# Patient Record
Sex: Female | Born: 1935
Health system: Southern US, Community
[De-identification: ages and names within clinical notes are randomized; demographics above are authoritative.]

## PROBLEM LIST (undated history)

## (undated) DIAGNOSIS — R112 Nausea with vomiting, unspecified: Secondary | ICD-10-CM

## (undated) DIAGNOSIS — R011 Cardiac murmur, unspecified: Secondary | ICD-10-CM

## (undated) DIAGNOSIS — F419 Anxiety disorder, unspecified: Secondary | ICD-10-CM

## (undated) DIAGNOSIS — M1990 Unspecified osteoarthritis, unspecified site: Secondary | ICD-10-CM

## (undated) DIAGNOSIS — I10 Essential (primary) hypertension: Secondary | ICD-10-CM

## (undated) DIAGNOSIS — T8859XA Other complications of anesthesia, initial encounter: Secondary | ICD-10-CM

## (undated) DIAGNOSIS — C801 Malignant (primary) neoplasm, unspecified: Secondary | ICD-10-CM

## (undated) DIAGNOSIS — Z9889 Other specified postprocedural states: Secondary | ICD-10-CM

## (undated) HISTORY — PX: REPLACEMENT TOTAL KNEE BILATERAL: SUR1225

## (undated) HISTORY — PX: NOSE SURGERY: SHX723

## (undated) HISTORY — PX: BLADDER SUSPENSION: SHX72

## (undated) HISTORY — DX: Essential (primary) hypertension: I10

## (undated) HISTORY — PX: CHOLECYSTECTOMY: SHX55

## (undated) HISTORY — PX: SHOULDER SURGERY: SHX246

## (undated) HISTORY — PX: ABDOMINAL HYSTERECTOMY: SHX81

---

## 1976-11-08 HISTORY — PX: BREAST BIOPSY: SHX20

## 2005-02-16 ENCOUNTER — Encounter: Admission: RE | Admit: 2005-02-16 | Discharge: 2005-02-16 | Payer: Self-pay | Admitting: Orthopedic Surgery

## 2005-03-15 ENCOUNTER — Ambulatory Visit (HOSPITAL_BASED_OUTPATIENT_CLINIC_OR_DEPARTMENT_OTHER): Admission: RE | Admit: 2005-03-15 | Discharge: 2005-03-15 | Payer: Self-pay | Admitting: Orthopedic Surgery

## 2005-03-15 ENCOUNTER — Ambulatory Visit (HOSPITAL_COMMUNITY): Admission: RE | Admit: 2005-03-15 | Discharge: 2005-03-15 | Payer: Self-pay | Admitting: Orthopedic Surgery

## 2005-07-20 ENCOUNTER — Ambulatory Visit: Payer: Self-pay | Admitting: Unknown Physician Specialty

## 2005-07-26 ENCOUNTER — Ambulatory Visit: Payer: Self-pay | Admitting: Unknown Physician Specialty

## 2005-08-03 ENCOUNTER — Ambulatory Visit: Payer: Self-pay | Admitting: Unknown Physician Specialty

## 2006-08-04 ENCOUNTER — Ambulatory Visit: Payer: Self-pay | Admitting: Unknown Physician Specialty

## 2007-07-25 ENCOUNTER — Ambulatory Visit: Payer: Self-pay | Admitting: Obstetrics and Gynecology

## 2007-07-25 ENCOUNTER — Other Ambulatory Visit: Payer: Self-pay

## 2007-08-08 ENCOUNTER — Ambulatory Visit: Payer: Self-pay | Admitting: Unknown Physician Specialty

## 2007-08-15 ENCOUNTER — Ambulatory Visit: Payer: Self-pay | Admitting: Obstetrics and Gynecology

## 2008-03-20 ENCOUNTER — Ambulatory Visit: Payer: Self-pay | Admitting: Unknown Physician Specialty

## 2008-08-09 ENCOUNTER — Ambulatory Visit: Payer: Self-pay | Admitting: Unknown Physician Specialty

## 2009-08-20 ENCOUNTER — Ambulatory Visit: Payer: Self-pay | Admitting: Unknown Physician Specialty

## 2010-08-28 ENCOUNTER — Ambulatory Visit: Payer: Self-pay | Admitting: Unknown Physician Specialty

## 2010-09-09 ENCOUNTER — Ambulatory Visit: Payer: Self-pay | Admitting: Unknown Physician Specialty

## 2010-10-27 ENCOUNTER — Ambulatory Visit: Payer: Self-pay | Admitting: Unknown Physician Specialty

## 2010-10-28 LAB — PATHOLOGY REPORT

## 2011-03-26 LAB — HM MAMMOGRAPHY: HM Mammogram: NORMAL

## 2011-04-26 LAB — HM COLONOSCOPY: HM Colonoscopy: NORMAL

## 2011-04-26 LAB — HM DEXA SCAN

## 2011-10-06 ENCOUNTER — Ambulatory Visit: Payer: Self-pay | Admitting: Unknown Physician Specialty

## 2012-04-25 ENCOUNTER — Encounter: Payer: Self-pay | Admitting: Internal Medicine

## 2012-04-25 ENCOUNTER — Ambulatory Visit: Payer: Self-pay | Admitting: Internal Medicine

## 2012-04-25 ENCOUNTER — Ambulatory Visit (INDEPENDENT_AMBULATORY_CARE_PROVIDER_SITE_OTHER): Payer: Medicare Other | Admitting: Internal Medicine

## 2012-04-25 VITALS — BP 110/60 | HR 50 | Temp 98.0°F | Ht 62.5 in | Wt 163.0 lb

## 2012-04-25 DIAGNOSIS — M545 Low back pain, unspecified: Secondary | ICD-10-CM

## 2012-04-25 DIAGNOSIS — I10 Essential (primary) hypertension: Secondary | ICD-10-CM

## 2012-04-25 DIAGNOSIS — D649 Anemia, unspecified: Secondary | ICD-10-CM

## 2012-04-25 DIAGNOSIS — M810 Age-related osteoporosis without current pathological fracture: Secondary | ICD-10-CM

## 2012-04-25 DIAGNOSIS — I7300 Raynaud's syndrome without gangrene: Secondary | ICD-10-CM

## 2012-04-25 DIAGNOSIS — Z23 Encounter for immunization: Secondary | ICD-10-CM

## 2012-04-25 DIAGNOSIS — E785 Hyperlipidemia, unspecified: Secondary | ICD-10-CM | POA: Insufficient documentation

## 2012-04-25 DIAGNOSIS — Z1239 Encounter for other screening for malignant neoplasm of breast: Secondary | ICD-10-CM | POA: Insufficient documentation

## 2012-04-25 LAB — COMPREHENSIVE METABOLIC PANEL
ALT: 23 U/L (ref 0–35)
AST: 27 U/L (ref 0–37)
Albumin: 4.2 g/dL (ref 3.5–5.2)
Alkaline Phosphatase: 73 U/L (ref 39–117)
BUN: 20 mg/dL (ref 6–23)
CO2: 27 mEq/L (ref 19–32)
Calcium: 9.1 mg/dL (ref 8.4–10.5)
Chloride: 104 mEq/L (ref 96–112)
Creatinine, Ser: 0.9 mg/dL (ref 0.4–1.2)
GFR: 69.58 mL/min (ref >60.00)
Glucose, Bld: 89 mg/dL (ref 70–99)
Potassium: 4 mEq/L (ref 3.5–5.1)
Sodium: 143 mEq/L (ref 135–145)
Total Bilirubin: 0.7 mg/dL (ref 0.3–1.2)
Total Protein: 7.9 g/dL (ref 6.0–8.3)

## 2012-04-25 LAB — MICROALBUMIN / CREATININE URINE RATIO
Creatinine,U: 63.3 mg/dL
Microalb Creat Ratio: 0.5 mg/g (ref 0.0–30.0)
Microalb, Ur: 0.3 mg/dL (ref 0.0–1.9)

## 2012-04-25 LAB — CBC WITH DIFFERENTIAL/PLATELET
Basophils Absolute: 0 10*3/uL (ref 0.0–0.1)
Basophils Relative: 0.9 % (ref 0.0–3.0)
Eosinophils Absolute: 0.3 10*3/uL (ref 0.0–0.7)
Eosinophils Relative: 5.1 % — ABNORMAL HIGH (ref 0.0–5.0)
HCT: 43.7 % (ref 36.0–46.0)
Hemoglobin: 14.6 g/dL (ref 12.0–15.0)
Lymphocytes Relative: 32.2 % (ref 12.0–46.0)
Lymphs Abs: 1.8 10*3/uL (ref 0.7–4.0)
MCHC: 33.5 g/dL (ref 30.0–36.0)
MCV: 97.1 fl (ref 78.0–100.0)
Monocytes Absolute: 0.5 10*3/uL (ref 0.1–1.0)
Monocytes Relative: 9.7 % (ref 3.0–12.0)
Neutro Abs: 2.9 10*3/uL (ref 1.4–7.7)
Neutrophils Relative %: 52.1 % (ref 43.0–77.0)
Platelets: 279 10*3/uL (ref 150.0–400.0)
RBC: 4.5 Mil/uL (ref 3.87–5.11)
RDW: 13.1 % (ref 11.5–14.6)
WBC: 5.6 10*3/uL (ref 4.5–10.5)

## 2012-04-25 LAB — LIPID PANEL
Cholesterol: 171 mg/dL (ref 0–200)
HDL: 52.6 mg/dL (ref >39.00)
LDL Cholesterol: 98 mg/dL (ref 0–99)
Total CHOL/HDL Ratio: 3
Triglycerides: 102 mg/dL (ref 0.0–149.0)
VLDL: 20.4 mg/dL (ref 0.0–40.0)

## 2012-04-25 LAB — HM PAP SMEAR

## 2012-04-25 MED ORDER — MELOXICAM 7.5 MG PO TABS: 7.5000 mg | ORAL_TABLET | Freq: Every day | ORAL | Status: DC

## 2012-04-25 MED ORDER — LOSARTAN POTASSIUM 100 MG PO TABS: 100.0000 mg | ORAL_TABLET | Freq: Every day | ORAL | Status: DC

## 2012-04-25 MED ORDER — METOPROLOL TARTRATE 50 MG PO TABS: 50.0000 mg | ORAL_TABLET | Freq: Two times a day (BID) | ORAL | Status: DC

## 2012-04-25 MED ORDER — HYDROCHLOROTHIAZIDE 12.5 MG PO CAPS: 12.5000 mg | ORAL_CAPSULE | Freq: Every day | ORAL | Status: DC

## 2012-04-25 MED ORDER — RISEDRONATE SODIUM 150 MG PO TABS: 150.0000 mg | ORAL_TABLET | ORAL | Status: DC

## 2012-04-25 NOTE — Assessment & Plan Note (Signed)
Breast exam normal today. Will get records on last mammogram. Mammogram due in November 2013.

## 2012-04-25 NOTE — Assessment & Plan Note (Signed)
Symptoms are consistent with Raynauds phenomenon. Encouraged her to avoid holding cold beverages or exposure to cold temperatures. She will use gloves as needed. If symptoms become persistent, we will consider using calcium channel blocker.

## 2012-04-25 NOTE — Assessment & Plan Note (Signed)
Will check LFTs and lipids with labs today. 

## 2012-04-25 NOTE — Assessment & Plan Note (Signed)
Blood pressure well-controlled today. Will check renal function and urine microalbumin with labs. Followup in 6 months.

## 2012-04-25 NOTE — Addendum Note (Signed)
Addended by: Gilmer Mor on: 04/25/2012 09:54 AM   Modules accepted: Orders

## 2012-04-25 NOTE — Assessment & Plan Note (Signed)
Patient complains of low back pain after a fall when trying to exit a van 2 weeks ago. Will get plain x-ray today to evaluate for fracture.

## 2012-04-25 NOTE — Assessment & Plan Note (Signed)
History of osteoporosis. On Actonel. Will get records on recent bone density testing.

## 2012-04-25 NOTE — Progress Notes (Signed)
Subjective:    Patient ID: Morgan Byrd, female    DOB: 1936-10-23, 76 y.o.   MRN: [id number]  HPI 76 year old female with history of hypertension, osteoarthritis, osteoporosis presents to establish care. She reports she is generally feeling well.  She is concerned today about occasional white discoloration on the tips of her fingers when she is holding a cold beverage. This resolves by warming her fingers. She describes it is slightly painful. It lasts typically for a few minutes. It does not occur in her feet.  She also notes that she was recently traveling and fell when she was trying to get out of the vehicle. She landed on her buttocks. Since that time, she has had some pain in her lower back, particularly with leaning forward. She questions whether she may have fractured her tailbone. She has not been taking any medication for this.  Outpatient Encounter Prescriptions as of 04/25/2012  Medication Sig Dispense Refill  . aspirin 81 MG EC tablet Take 81 mg by mouth daily. Swallow whole.      . hydrochlorothiazide (MICROZIDE) 12.5 MG capsule Take 1 capsule (12.5 mg total) by mouth daily.  90 capsule  3  . losartan (COZAAR) 100 MG tablet Take 1 tablet (100 mg total) by mouth daily.  90 tablet  3  . meloxicam (MOBIC) 7.5 MG tablet Take 1 tablet (7.5 mg total) by mouth daily.  90 tablet  3  . metoprolol (LOPRESSOR) 50 MG tablet Take 1 tablet (50 mg total) by mouth 2 (two) times daily.  180 tablet  3  . risedronate (ACTONEL) 150 MG tablet Take 1 tablet (150 mg total) by mouth every 30 (thirty) days. with water on empty stomach, nothing by mouth or lie down for next 30 minutes.  4 tablet  3    Review of Systems  Constitutional: Negative for fever, chills, appetite change, fatigue and unexpected weight change.  HENT: Negative for ear pain, congestion, sore throat, trouble swallowing, neck pain, voice change and sinus pressure.   Eyes: Negative for visual disturbance.  Respiratory: Negative for  cough, shortness of breath, wheezing and stridor.   Cardiovascular: Negative for chest pain, palpitations and leg swelling.  Gastrointestinal: Negative for nausea, vomiting, abdominal pain, diarrhea, constipation, blood in stool, abdominal distention and anal bleeding.  Genitourinary: Negative for dysuria and flank pain.  Musculoskeletal: Positive for myalgias, back pain and arthralgias. Negative for gait problem.  Skin: Positive for color change. Negative for rash.  Neurological: Negative for dizziness and headaches.  Hematological: Negative for adenopathy. Does not bruise/bleed easily.  Psychiatric/Behavioral: Negative for suicidal ideas, disturbed wake/sleep cycle and dysphoric mood. The patient is not nervous/anxious.    BP 110/60  Pulse 50  Temp 98 F (36.7 C) (Oral)  Ht 5' 2.5" (1.588 m)  Wt 163 lb (73.936 kg)  BMI 29.34 kg/m2  SpO2 96%     Objective:   Physical Exam  Constitutional: She is oriented to person, place, and time. She appears well-developed and well-nourished. No distress.  HENT:  Head: Normocephalic and atraumatic.  Right Ear: External ear normal.  Left Ear: External ear normal.  Nose: Nose normal.  Mouth/Throat: Oropharynx is clear and moist. No oropharyngeal exudate.  Eyes: Conjunctivae are normal. Pupils are equal, round, and reactive to light. Right eye exhibits no discharge. Left eye exhibits no discharge. No scleral icterus.  Neck: Normal range of motion. Neck supple. No tracheal deviation present. No thyromegaly present.  Cardiovascular: Normal rate, regular rhythm, normal heart sounds and intact  distal pulses.  Exam reveals no gallop and no friction rub.   No murmur heard. Pulmonary/Chest: Effort normal and breath sounds normal. No accessory muscle usage. Not tachypneic. No respiratory distress. She has no decreased breath sounds. She has no wheezes. She has no rhonchi. She has no rales. She exhibits no tenderness. Right breast exhibits no inverted nipple,  no mass, no nipple discharge, no skin change and no tenderness. Left breast exhibits no inverted nipple, no mass, no nipple discharge, no skin change and no tenderness. Breasts are symmetrical.  Abdominal: Soft. Bowel sounds are normal. She exhibits no distension and no mass. There is no tenderness. There is no rebound and no guarding.  Musculoskeletal: Normal range of motion. She exhibits no edema and no tenderness.  Lymphadenopathy:    She has no cervical adenopathy.  Neurological: She is alert and oriented to person, place, and time. No cranial nerve deficit. She exhibits normal muscle tone. Coordination normal.  Skin: Skin is warm and dry. No rash noted. She is not diaphoretic. No erythema. No pallor.  Psychiatric: She has a normal mood and affect. Her behavior is normal. Judgment and thought content normal.          Assessment & Plan:

## 2012-04-25 NOTE — Patient Instructions (Signed)
Raynaud's Syndrome  Raynaud's Syndrome is a disorder of the blood vessels in your hands and feet. It occurs when small arteries of the arms/hands or legs/feet become sensitive to cold or emotional upset. This causes the arteries to constrict, or narrow, and reduces blood flow to the area. The color in the fingers or toes changes from white to bluish to red and this is not usually painful. There may be numbness and tingling. Sores on the skin (ulcers) can form. Symptoms are usually relieved by warming.  HOME CARE INSTRUCTIONS    Avoid exposure to cold. Keep your whole body warm and dry. Dress in layers. Wear mittens or gloves when handling ice or frozen food and when outdoors. Use holders for glasses or cans containing cold drinks. If possible, stay indoors during cold weather.   Limit your use of caffeine. Switch to decaffeinated coffee, tea, and soda pop. Avoid chocolate.   Avoid smoking or being around cigarette smoke. Smoke will make symptoms worse.   Wear loose fitting socks and comfortable, roomy shoes.   Avoid vibrating tools and machinery.   If possible, avoid stressful and emotional situations. Exercise, meditation and yoga may help you cope with stress. Biofeedback may be useful.   Ask your caregiver about medicine (calcium channel blockers) that may control Raynaud's phenomena.  SEEK MEDICAL CARE IF:    Your discomfort becomes worse, despite conservative treatment.   You develop sores on your fingers and toes that do not heal.  Document Released: 10/22/2000 Document Revised: 10/14/2011 Document Reviewed: 10/29/2008  [employer] Patient Information 2012 [employer].

## 2012-04-26 ENCOUNTER — Encounter: Payer: Self-pay | Admitting: Internal Medicine

## 2012-04-26 ENCOUNTER — Telehealth: Payer: Self-pay | Admitting: Internal Medicine

## 2012-04-26 DIAGNOSIS — M549 Dorsalgia, unspecified: Secondary | ICD-10-CM

## 2012-04-26 NOTE — Telephone Encounter (Signed)
Xray lumbar spine abnormal.

## 2012-05-01 ENCOUNTER — Encounter: Payer: Self-pay | Admitting: Internal Medicine

## 2012-09-07 ENCOUNTER — Encounter: Payer: Self-pay | Admitting: Internal Medicine

## 2012-10-09 ENCOUNTER — Ambulatory Visit (INDEPENDENT_AMBULATORY_CARE_PROVIDER_SITE_OTHER): Payer: Medicare Other | Admitting: Internal Medicine

## 2012-10-09 ENCOUNTER — Encounter: Payer: Self-pay | Admitting: Internal Medicine

## 2012-10-09 VITALS — BP 126/78 | HR 70 | Temp 98.1°F | Resp 16 | Wt 175.0 lb

## 2012-10-09 DIAGNOSIS — Z1331 Encounter for screening for depression: Secondary | ICD-10-CM

## 2012-10-09 DIAGNOSIS — R1310 Dysphagia, unspecified: Secondary | ICD-10-CM

## 2012-10-09 DIAGNOSIS — I10 Essential (primary) hypertension: Secondary | ICD-10-CM

## 2012-10-09 LAB — COMPREHENSIVE METABOLIC PANEL
ALT: 25 U/L (ref 0–35)
AST: 27 U/L (ref 0–37)
Albumin: 4.2 g/dL (ref 3.5–5.2)
Alkaline Phosphatase: 54 U/L (ref 39–117)
BUN: 22 mg/dL (ref 6–23)
CO2: 28 mEq/L (ref 19–32)
Calcium: 9.4 mg/dL (ref 8.4–10.5)
Chloride: 100 mEq/L (ref 96–112)
Creatinine, Ser: 0.8 mg/dL (ref 0.4–1.2)
GFR: 79.07 mL/min (ref >60.00)
Glucose, Bld: 93 mg/dL (ref 70–99)
Potassium: 4 mEq/L (ref 3.5–5.1)
Sodium: 138 mEq/L (ref 135–145)
Total Bilirubin: 0.9 mg/dL (ref 0.3–1.2)
Total Protein: 7.7 g/dL (ref 6.0–8.3)

## 2012-10-09 LAB — MICROALBUMIN / CREATININE URINE RATIO
Creatinine,U: 122.6 mg/dL
Microalb Creat Ratio: 0.6 mg/g (ref 0.0–30.0)
Microalb, Ur: 0.7 mg/dL (ref 0.0–1.9)

## 2012-10-09 NOTE — Assessment & Plan Note (Signed)
BP well controlled today. Will check renal function with labs. Follow up 6 months and prn.

## 2012-10-09 NOTE — Assessment & Plan Note (Signed)
Symptoms of esophageal dysphagia. Suspect esophageal narrowing. Will set up evaluation with Dr. Markham Jordan in GI. Pt will call if any problems prior to her appointment.

## 2012-10-09 NOTE — Progress Notes (Signed)
Subjective:    Patient ID: Morgan Byrd, female    DOB: [birth year], 76 y.o.   MRN: [id number]  HPI 76 year old female with history of hypertension presents for followup. Her primary concern today is several week history of intermittent dysphasia. She reports that when she is eating solid foods such as meat she sometimes has difficulty swallowing. She reports that food gets caught in her upper chest or lower throat it is difficult to pass. She had one instance where it took several hours to pass a small amount of hamburger meat. She denies any pain with swallowing. She denies any nausea or vomiting. She denies abdominal pain. She has noted some hoarseness in her voice. She attributes this to posterior nasal drainage with seasonal allergies. She is not currently taking any medication for this.  In regards to blood pressure, she reports compliance with medications. She denies any chest pain, palpitations, headache.  Outpatient Encounter Prescriptions as of 10/09/2012  Medication Sig Dispense Refill  . aspirin 81 MG EC tablet Take 81 mg by mouth daily. Swallow whole.      Marland Kitchen FLUVIRIN INJ injection       . hydrochlorothiazide (MICROZIDE) 12.5 MG capsule Take 1 capsule (12.5 mg total) by mouth daily.  90 capsule  3  . losartan (COZAAR) 100 MG tablet Take 1 tablet (100 mg total) by mouth daily.  90 tablet  3  . meloxicam (MOBIC) 7.5 MG tablet Take 1 tablet (7.5 mg total) by mouth daily.  90 tablet  3  . metoprolol (LOPRESSOR) 50 MG tablet Take 1 tablet (50 mg total) by mouth 2 (two) times daily.  180 tablet  3  . risedronate (ACTONEL) 150 MG tablet Take 1 tablet (150 mg total) by mouth every 30 (thirty) days. with water on empty stomach, nothing by mouth or lie down for next 30 minutes.  4 tablet  3   BP 126/78  Pulse 70  Temp 98.1 F (36.7 C) (Oral)  Resp 16  Wt 175 lb (79.379 kg)  Review of Systems  Constitutional: Negative for fever, chills, appetite change, fatigue and unexpected weight  change.  HENT: Positive for trouble swallowing, voice change and postnasal drip. Negative for ear pain, congestion, sore throat, neck pain and sinus pressure.   Eyes: Negative for visual disturbance.  Respiratory: Negative for cough, shortness of breath, wheezing and stridor.   Cardiovascular: Negative for chest pain, palpitations and leg swelling.  Gastrointestinal: Negative for nausea, vomiting, abdominal pain, diarrhea, constipation, blood in stool, abdominal distention and anal bleeding.  Genitourinary: Negative for dysuria and flank pain.  Musculoskeletal: Negative for myalgias, arthralgias and gait problem.  Skin: Negative for color change and rash.  Neurological: Negative for dizziness and headaches.  Hematological: Negative for adenopathy. Does not bruise/bleed easily.  Psychiatric/Behavioral: Negative for suicidal ideas, sleep disturbance and dysphoric mood. The patient is not nervous/anxious.        Objective:   Physical Exam  Constitutional: She is oriented to person, place, and time. She appears well-developed and well-nourished. No distress.  HENT:  Head: Normocephalic and atraumatic.  Right Ear: External ear normal.  Left Ear: External ear normal.  Nose: Nose normal.  Mouth/Throat: Oropharynx is clear and moist. No oropharyngeal exudate.  Eyes: Conjunctivae normal are normal. Pupils are equal, round, and reactive to light. Right eye exhibits no discharge. Left eye exhibits no discharge. No scleral icterus.  Neck: Normal range of motion. Neck supple. No tracheal deviation present. No thyromegaly present.  Cardiovascular: Normal rate, regular  rhythm, normal heart sounds and intact distal pulses.  Exam reveals no gallop and no friction rub.   No murmur heard. Pulmonary/Chest: Effort normal and breath sounds normal. No respiratory distress. She has no wheezes. She has no rales. She exhibits no tenderness.  Musculoskeletal: Normal range of motion. She exhibits no edema and no  tenderness.  Lymphadenopathy:    She has no cervical adenopathy.  Neurological: She is alert and oriented to person, place, and time. No cranial nerve deficit. She exhibits normal muscle tone. Coordination normal.  Skin: Skin is warm and dry. No rash noted. She is not diaphoretic. No erythema. No pallor.  Psychiatric: She has a normal mood and affect. Her behavior is normal. Judgment and thought content normal.          Assessment & Plan:

## 2012-10-11 ENCOUNTER — Ambulatory Visit: Payer: Self-pay | Admitting: Internal Medicine

## 2012-10-11 ENCOUNTER — Telehealth: Payer: Self-pay | Admitting: Internal Medicine

## 2012-10-11 NOTE — Telephone Encounter (Signed)
Mammogram 10/11/2012 showed increased nodularity on the right. Recommended additional views. Please make sure this has been set up and schedule pt follow up appointment with me to review findings.

## 2012-10-12 ENCOUNTER — Ambulatory Visit: Payer: Self-pay | Admitting: Internal Medicine

## 2012-10-12 NOTE — Telephone Encounter (Signed)
Pt stated she had additional views completed today. Appt scheduled with provider.

## 2012-10-12 NOTE — Telephone Encounter (Signed)
LMOVM for Pt to return my call to answer questions in regard to recent mammogram.

## 2012-10-13 ENCOUNTER — Telehealth: Payer: Self-pay | Admitting: Internal Medicine

## 2012-10-13 NOTE — Telephone Encounter (Signed)
Mammogram needs repeat 04/2013

## 2012-10-17 ENCOUNTER — Ambulatory Visit: Payer: Medicare Other | Admitting: Internal Medicine

## 2012-10-20 ENCOUNTER — Ambulatory Visit: Payer: Self-pay | Admitting: Unknown Physician Specialty

## 2012-10-23 ENCOUNTER — Encounter: Payer: Self-pay | Admitting: Internal Medicine

## 2012-11-02 ENCOUNTER — Encounter: Payer: Self-pay | Admitting: Internal Medicine

## 2012-11-15 ENCOUNTER — Encounter: Payer: Self-pay | Admitting: Internal Medicine

## 2013-03-06 ENCOUNTER — Telehealth: Payer: Self-pay | Admitting: Internal Medicine

## 2013-03-06 NOTE — Telephone Encounter (Signed)
Contacted home services regarding TDAP coverage, spoke with Candace; no info available.  Contacted provider line/[employer], spoke with Helmut Muster:  Pt has A/B, TDAP is covered under Plan D.  Per Conni Slipper is not covered under pt plan.

## 2013-03-06 NOTE — Telephone Encounter (Signed)
Patient received a Tdap in June of last year.

## 2013-03-06 NOTE — Telephone Encounter (Signed)
If this is covered under Part D, then we should let her know that a local pharmacy could provide Tdap, which is a recommended vaccine.

## 2013-03-27 ENCOUNTER — Telehealth: Payer: Self-pay | Admitting: Emergency Medicine

## 2013-03-27 DIAGNOSIS — R928 Other abnormal and inconclusive findings on diagnostic imaging of breast: Secondary | ICD-10-CM

## 2013-03-27 NOTE — Telephone Encounter (Signed)
Order placed

## 2013-03-27 NOTE — Telephone Encounter (Signed)
Kim @ [facility] left me VM stating the patient will be due for a diag uni R mammogram to f/u nodularity. Please advise.

## 2013-04-05 ENCOUNTER — Encounter: Payer: Self-pay | Admitting: Emergency Medicine

## 2013-04-16 LAB — HM MAMMOGRAPHY: HM Mammogram: NORMAL

## 2013-04-18 ENCOUNTER — Ambulatory Visit: Payer: Self-pay | Admitting: Internal Medicine

## 2013-04-25 ENCOUNTER — Other Ambulatory Visit: Payer: Self-pay | Admitting: Internal Medicine

## 2013-04-30 ENCOUNTER — Ambulatory Visit (INDEPENDENT_AMBULATORY_CARE_PROVIDER_SITE_OTHER): Payer: Medicare Other | Admitting: Internal Medicine

## 2013-04-30 ENCOUNTER — Encounter: Payer: Self-pay | Admitting: Internal Medicine

## 2013-04-30 VITALS — BP 140/78 | HR 54 | Temp 98.0°F | Ht 62.0 in | Wt 172.0 lb

## 2013-04-30 DIAGNOSIS — Z0000 Encounter for general adult medical examination without abnormal findings: Secondary | ICD-10-CM

## 2013-04-30 DIAGNOSIS — M545 Low back pain, unspecified: Secondary | ICD-10-CM

## 2013-04-30 DIAGNOSIS — I10 Essential (primary) hypertension: Secondary | ICD-10-CM

## 2013-04-30 LAB — LIPID PANEL
Cholesterol: 203 mg/dL — ABNORMAL HIGH (ref 0–200)
HDL: 62.2 mg/dL (ref >39.00)
Total CHOL/HDL Ratio: 3
Triglycerides: 91 mg/dL (ref 0.0–149.0)
VLDL: 18.2 mg/dL (ref 0.0–40.0)

## 2013-04-30 LAB — COMPREHENSIVE METABOLIC PANEL
ALT: 23 U/L (ref 0–35)
AST: 25 U/L (ref 0–37)
Albumin: 4.2 g/dL (ref 3.5–5.2)
Alkaline Phosphatase: 62 U/L (ref 39–117)
BUN: 19 mg/dL (ref 6–23)
CO2: 29 mEq/L (ref 19–32)
Calcium: 9.4 mg/dL (ref 8.4–10.5)
Chloride: 103 mEq/L (ref 96–112)
Creatinine, Ser: 0.7 mg/dL (ref 0.4–1.2)
GFR: 89.77 mL/min (ref >60.00)
Glucose, Bld: 97 mg/dL (ref 70–99)
Potassium: 4.5 mEq/L (ref 3.5–5.1)
Sodium: 140 mEq/L (ref 135–145)
Total Bilirubin: 0.9 mg/dL (ref 0.3–1.2)
Total Protein: 8 g/dL (ref 6.0–8.3)

## 2013-04-30 LAB — LDL CHOLESTEROL, DIRECT: Direct LDL: 131.8 mg/dL

## 2013-04-30 LAB — MICROALBUMIN / CREATININE URINE RATIO
Creatinine,U: 50 mg/dL
Microalb Creat Ratio: 1 mg/g (ref 0.0–30.0)
Microalb, Ur: 0.5 mg/dL (ref 0.0–1.9)

## 2013-04-30 MED ORDER — HYDROCHLOROTHIAZIDE 12.5 MG PO CAPS: 12.5000 mg | ORAL_CAPSULE | Freq: Every day | ORAL | Status: DC

## 2013-04-30 MED ORDER — LOSARTAN POTASSIUM 100 MG PO TABS: 100.0000 mg | ORAL_TABLET | Freq: Every day | ORAL | Status: DC

## 2013-04-30 MED ORDER — MELOXICAM 7.5 MG PO TABS: 7.5000 mg | ORAL_TABLET | Freq: Every day | ORAL | Status: DC

## 2013-04-30 MED ORDER — METOPROLOL TARTRATE 50 MG PO TABS: 50.0000 mg | ORAL_TABLET | Freq: Two times a day (BID) | ORAL | Status: DC

## 2013-04-30 MED ORDER — OMEPRAZOLE 20 MG PO CPDR: 20.0000 mg | DELAYED_RELEASE_CAPSULE | Freq: Every day | ORAL | Status: DC

## 2013-04-30 NOTE — Assessment & Plan Note (Signed)
BP Readings from Last 3 Encounters:  04/30/13 140/78  10/09/12 126/78  04/25/12 110/60   BP well controlled on current medications. Will continue.

## 2013-04-30 NOTE — Progress Notes (Signed)
Subjective:    Patient ID: Morgan Byrd, female    DOB: 18-Jun-1936, 77 y.o.   MRN: [id number]  HPI The patient is here for annual Medicare wellness examination and management of other chronic and acute problems.   The risk factors are reflected in the social history.  The roster of all physicians providing medical care to patient - is listed in the Snapshot section of the chart.  Activities of daily living:  The patient is 100% independent in all ADLs: dressing, toileting, feeding as well as independent mobility  Home safety : The patient has smoke detectors in the home. They wear seatbelts.  There are no firearms at home. There is no violence in the home.   There is no risks for hepatitis, STDs or HIV. There is no history of blood transfusion. They have no travel history to infectious disease endemic areas of the world.  The patient has seen their dentist in the last six month. [facility] They have seen their eye doctor in the last year. Dr. Clydene Pugh No issues with .They have deferred audiologic testing in the last year.   They do not  have excessive sun exposure. Discussed the need for sun protection: hats, long sleeves and use of sunscreen if there is significant sun exposure. Dr. Ebony Cargo  Diet: the importance of a healthy diet is discussed. They do have a relatively healthy diet.  The benefits of regular aerobic exercise were discussed. She walks typically 5 days per week.  Depression screen: there are no signs or vegative symptoms of depression- irritability, change in appetite, anhedonia, sadness/tearfullness.  Cognitive assessment: the patient manages all their financial and personal affairs and is actively engaged. They could relate day,date,year and events.  The following portions of the patient's history were reviewed and updated as appropriate: allergies, current medications, past family history, past medical history,  past surgical history, past social history   and problem list.  Visual acuity was not assessed per patient preference since she has regular follow up with her ophthalmologist. Hearing and body mass index were assessed and reviewed.   During the course of the visit the patient was educated and counseled about appropriate screening and preventive services including : fall prevention , diabetes screening, nutrition counseling, colorectal cancer screening, and recommended immunizations.     Outpatient Prescriptions Prior to Visit  Medication Sig Dispense Refill  . ACTONEL 150 MG tablet take 1 tablet by mouth every month  4 tablet  3  . aspirin 81 MG EC tablet Take 81 mg by mouth daily. Swallow whole.      . hydrochlorothiazide (MICROZIDE) 12.5 MG capsule Take 1 capsule (12.5 mg total) by mouth daily.  90 capsule  3  . losartan (COZAAR) 100 MG tablet Take 1 tablet (100 mg total) by mouth daily.  90 tablet  3  . meloxicam (MOBIC) 7.5 MG tablet Take 1 tablet (7.5 mg total) by mouth daily.  90 tablet  3  . metoprolol (LOPRESSOR) 50 MG tablet Take 1 tablet (50 mg total) by mouth 2 (two) times daily.  180 tablet  3  . FLUVIRIN INJ injection        No facility-administered medications prior to visit.   BP 140/78  Pulse 54  Temp(Src) 98 F (36.7 C) (Oral)  Ht 5\' 2"  (1.575 m)  Wt 172 lb (78.019 kg)  BMI 31.45 kg/m2  SpO2 95%  Review of Systems  Constitutional: Negative for fever, chills, appetite change, fatigue and unexpected weight change.  HENT: Negative for ear pain, congestion, sore throat, trouble swallowing, neck pain, voice change and sinus pressure.   Eyes: Negative for visual disturbance.  Respiratory: Negative for cough, shortness of breath, wheezing and stridor.   Cardiovascular: Negative for chest pain, palpitations and leg swelling.  Gastrointestinal: Negative for nausea, vomiting, abdominal pain, diarrhea, constipation, blood in stool, abdominal distention and anal bleeding.  Genitourinary: Negative for dysuria and flank  pain.  Musculoskeletal: Negative for myalgias, arthralgias and gait problem.  Skin: Negative for color change and rash.  Neurological: Negative for dizziness and headaches.  Hematological: Negative for adenopathy. Does not bruise/bleed easily.  Psychiatric/Behavioral: Negative for suicidal ideas, sleep disturbance and dysphoric mood. The patient is not nervous/anxious.        Objective:   Physical Exam  Constitutional: She is oriented to person, place, and time. She appears well-developed and well-nourished. No distress.  HENT:  Head: Normocephalic and atraumatic.  Right Ear: External ear normal.  Left Ear: External ear normal.  Nose: Nose normal.  Mouth/Throat: Oropharynx is clear and moist. No oropharyngeal exudate.  Eyes: Conjunctivae are normal. Pupils are equal, round, and reactive to light. Right eye exhibits no discharge. Left eye exhibits no discharge. No scleral icterus.  Neck: Normal range of motion. Neck supple. No tracheal deviation present. No thyromegaly present.  Cardiovascular: Normal rate, regular rhythm, normal heart sounds and intact distal pulses.  Exam reveals no gallop and no friction rub.   No murmur heard. Pulmonary/Chest: Effort normal and breath sounds normal. No accessory muscle usage. Not tachypneic. No respiratory distress. She has no decreased breath sounds. She has no wheezes. She has no rales. She exhibits no tenderness. Right breast exhibits no inverted nipple, no mass, no nipple discharge, no skin change and no tenderness. Left breast exhibits no inverted nipple, no mass, no nipple discharge, no skin change and no tenderness. Breasts are symmetrical.  Abdominal: Soft. Bowel sounds are normal. She exhibits no distension and no mass. There is no tenderness. There is no rebound and no guarding.  Musculoskeletal: Normal range of motion. She exhibits no edema and no tenderness.  Lymphadenopathy:    She has no cervical adenopathy.  Neurological: She is alert and  oriented to person, place, and time. No cranial nerve deficit. She exhibits normal muscle tone. Coordination normal.  Skin: Skin is warm and dry. No rash noted. She is not diaphoretic. No erythema. No pallor.  Psychiatric: She has a normal mood and affect. Her behavior is normal. Judgment and thought content normal.          Assessment & Plan:

## 2013-04-30 NOTE — Assessment & Plan Note (Signed)
Symptoms well controlled with Meloxicam. Will continue.

## 2013-04-30 NOTE — Assessment & Plan Note (Signed)
General medical exam including breast exam normal today. Pelvic exam deferred given age and preference, s/p hysterectomy. Mammogram ordered. Will check labs today including CBC, CMP, lipids. Encouraged continued efforts at healthy diet and regular physical activity. EKG showed LBBB, unclear if this is old finding, will request previous EKG from [facility].

## 2013-05-21 ENCOUNTER — Encounter: Payer: Self-pay | Admitting: Internal Medicine

## 2013-05-22 ENCOUNTER — Other Ambulatory Visit: Payer: Self-pay | Admitting: Internal Medicine

## 2013-05-22 DIAGNOSIS — M25559 Pain in unspecified hip: Secondary | ICD-10-CM

## 2013-05-30 ENCOUNTER — Other Ambulatory Visit: Payer: Self-pay | Admitting: Internal Medicine

## 2013-06-05 ENCOUNTER — Encounter: Payer: Self-pay | Admitting: Internal Medicine

## 2013-06-06 ENCOUNTER — Encounter: Payer: Self-pay | Admitting: Family Medicine

## 2013-06-06 ENCOUNTER — Ambulatory Visit (INDEPENDENT_AMBULATORY_CARE_PROVIDER_SITE_OTHER): Payer: Medicare Other | Admitting: Family Medicine

## 2013-06-06 VITALS — BP 102/62 | HR 54 | Temp 97.9°F | Wt 178.2 lb

## 2013-06-06 DIAGNOSIS — M545 Low back pain, unspecified: Secondary | ICD-10-CM

## 2013-06-06 NOTE — Progress Notes (Signed)
Nature conservation officer at [facility] [address] [place] [address] Phone: [phone] Fax: [phone]  Date:  06/06/2013   Name:  Morgan Byrd   DOB:  [birth year]   MRN:  [id number] Gender: female Age: 77 y.o.  Primary Physician:  Morgan Dove, MD  Evaluating MD: Morgan Beat, MD   Chief Complaint: Hip Pain   History of Present Illness:  Morgan Byrd is a 77 y.o. pleasant patient who presents with the following:  About a month and a half ago, she started hurt on her lateral left leg without any preceding, trauma, fall, or exacerbating event. Now, she is having some posterior buttocks pain on the left, but this has been improving. Denies groin pain. No anterior pain.  No numbness, tingling, bowel or bladder incontinence. She is s/p B knee replacement, has some post-op numbness at incision, and I actually saw her myself about 6-7 years ago when I worked for a different group.   Right now, she does no feel all that limited.  Patient Active Problem List   Diagnosis Date Noted  . Medicare annual wellness visit, subsequent 04/30/2013  . Hypertension 04/25/2012  . Low back pain 04/25/2012  . Osteoporosis 04/25/2012  . Hyperlipidemia LDL goal < 100 04/25/2012  . Screening for breast cancer 04/25/2012  . Raynaud phenomenon 04/25/2012    Past Medical History  Diagnosis Date  . Hypertension     Past Surgical History  Procedure Laterality Date  . Replacement total knee bilateral    . Abdominal hysterectomy    . Bladder suspension    . Shoulder surgery    . Vaginal delivery      2    History   Social History  . Marital Status: Married    Spouse Name: N/A    Number of Children: N/A  . Years of Education: N/A   Occupational History  . Not on file.   Social History Main Topics  . Smoking status: Never Smoker   . Smokeless tobacco: Not on file  . Alcohol Use: No  . Drug Use: Not on file  . Sexually Active: Not on file   Other Topics  Concern  . Not on file   Social History Narrative   Lives in [place] with husband. Son and daughter live nearby.      Work - retired [employer] operational officer      Diet - healthy, regular   Exercise - housework    Family History  Problem Relation Age of Onset  . Dementia Mother   . Arthritis Mother   . Heart disease Father   . Hypertension Father   . Cancer Father     prostate  . Diabetes Father   . Diabetes Sister   . Cancer Sister     breast    Allergies  Allergen Reactions  . Codeine Nausea And Vomiting    Medication list has been reviewed and updated.  Outpatient Prescriptions Prior to Visit  Medication Sig Dispense Refill  . ACTONEL 150 MG tablet take 1 tablet by mouth every month  4 tablet  3  . aspirin 81 MG EC tablet Take 81 mg by mouth daily. Swallow whole.      . hydrochlorothiazide (MICROZIDE) 12.5 MG capsule take 1 capsule by mouth once daily  90 capsule  3  . losartan (COZAAR) 100 MG tablet Take 1 tablet (100 mg total) by mouth daily.  90 tablet  4  . meloxicam (MOBIC) 7.5 MG tablet Take 1  tablet (7.5 mg total) by mouth daily.  90 tablet  4  . metoprolol (LOPRESSOR) 50 MG tablet Take 1 tablet (50 mg total) by mouth 2 (two) times daily.  180 tablet  4  . omeprazole (PRILOSEC) 20 MG capsule Take 1 capsule (20 mg total) by mouth daily.  90 capsule  4  . hydrochlorothiazide (MICROZIDE) 12.5 MG capsule Take 1 capsule (12.5 mg total) by mouth daily.  90 capsule  4  . losartan (COZAAR) 100 MG tablet take 1 tablet by mouth once daily  90 tablet  3  . meloxicam (MOBIC) 7.5 MG tablet take 1 tablet by mouth once daily  90 tablet  3  . metoprolol (LOPRESSOR) 50 MG tablet take 1 tablet by mouth twice a day  180 tablet  3   No facility-administered medications prior to visit.    Review of Systems:   GEN: No fevers, chills. Nontoxic. Primarily MSK c/o today. MSK: Detailed in the HPI GI: tolerating PO intake without difficulty Neuro: No numbness, parasthesias, or tingling  associated. Otherwise the pertinent positives of the ROS are noted above.    Physical Examination: BP 102/62  Pulse 54  Temp(Src) 97.9 F (36.6 C) (Oral)  Wt 178 lb 4 oz (80.854 kg)  BMI 32.59 kg/m2  SpO2 94%  Ideal Body Weight:     GEN: Well-developed,well-nourished,in no acute distress; alert,appropriate and cooperative throughout examination HEENT: Normocephalic and atraumatic without obvious abnormalities. Ears, externally no deformities PULM: Breathing comfortably in no respiratory distress EXT: No clubbing, cyanosis, or edema PSYCH: Normally interactive. Cooperative during the interview. Pleasant. Friendly and conversant. Not anxious or depressed appearing. Normal, full affect.  Range of motion at  the waist: Flexion: normal Extension: normal Lateral bending: normal Rotation: all normal  No echymosis or edema Rises to examination table with no difficulty Gait: non antalgic  Inspection/Deformity: N Paraspinus Tenderness: no  B Ankle Dorsiflexion (L5,4): 5/5 B Great Toe Dorsiflexion (L5,4): 5/5 Heel Walk (L5): WNL Toe Walk (S1): WNL Rise/Squat (L4): WNL  SENSORY B Medial Foot (L4): WNL B Dorsum (L5): WNL B Lateral (S1): WNL Light Touch: WNL Pinprick: WNL (decreased near knee)  REFLEXES Knee (L4): 2+ Ankle (S1): 2+  B SLR, seated: neg B SLR, supine: neg B FABER: neg B Reverse FABER: neg B Greater Troch: NT B Log Roll: neg B Sciatic Notch: NT   Hips with excellent motion, no pain with terminal IROM or EROM B  Assessment and Plan:  Low back pain  And referred posterior buttocks pain, which is mostly improved. Not c/w intraarticular hip pathology. We discussed options and she is going to continue with conservative care. Cont Mobic, and I gave her some back rehab to work on at home.  I appreciate the opportunity to evaluate this very friendly patient. If you have any question regarding her care or prognosis, do not hesitate to ask.   Orders Today:    No orders of the defined types were placed in this encounter.    Updated Medication List: (Includes new medications, updates to list, dose adjustments) No orders of the defined types were placed in this encounter.    Medications Discontinued: Medications Discontinued During This Encounter  Medication Reason  . hydrochlorothiazide (MICROZIDE) 12.5 MG capsule Duplicate  . losartan (COZAAR) 100 MG tablet Duplicate  . meloxicam (MOBIC) 7.5 MG tablet Duplicate  . metoprolol (LOPRESSOR) 50 MG tablet Duplicate      Signed, Rashanna Christiana T. Olvin Rohr, MD 06/06/2013 9:25 AM

## 2013-06-26 ENCOUNTER — Telehealth: Payer: Self-pay | Admitting: Internal Medicine

## 2013-06-26 NOTE — Telephone Encounter (Signed)
Left message with the female that answered the phone to inform her handicap was at front desk ready to pick up

## 2013-06-26 NOTE — Telephone Encounter (Signed)
Pt dropped off handicapp form to be filled out In box

## 2013-07-18 ENCOUNTER — Ambulatory Visit: Payer: Medicare Other | Admitting: Family Medicine

## 2013-09-13 ENCOUNTER — Other Ambulatory Visit: Payer: Self-pay

## 2013-09-29 ENCOUNTER — Encounter: Payer: Self-pay | Admitting: Internal Medicine

## 2013-10-24 ENCOUNTER — Other Ambulatory Visit: Payer: Self-pay | Admitting: Internal Medicine

## 2013-10-26 ENCOUNTER — Ambulatory Visit (INDEPENDENT_AMBULATORY_CARE_PROVIDER_SITE_OTHER): Payer: Medicare Other | Admitting: Internal Medicine

## 2013-10-26 ENCOUNTER — Encounter: Payer: Self-pay | Admitting: Internal Medicine

## 2013-10-26 VITALS — BP 136/78 | HR 73 | Temp 97.8°F | Wt 179.0 lb

## 2013-10-26 DIAGNOSIS — H6690 Otitis media, unspecified, unspecified ear: Secondary | ICD-10-CM

## 2013-10-26 DIAGNOSIS — H6693 Otitis media, unspecified, bilateral: Secondary | ICD-10-CM

## 2013-10-26 LAB — POCT INFLUENZA A/B
Influenza A, POC: NEGATIVE
Influenza B, POC: NEGATIVE

## 2013-10-26 MED ORDER — ANTIPYRINE-BENZOCAINE 5.4-1.4 % OT SOLN: 3.0000 [drp] | OTIC | Status: DC | PRN

## 2013-10-26 MED ORDER — BENZONATATE 200 MG PO CAPS: 200.0000 mg | ORAL_CAPSULE | Freq: Two times a day (BID) | ORAL | Status: DC | PRN

## 2013-10-26 MED ORDER — AMOXICILLIN-POT CLAVULANATE 875-125 MG PO TABS: 1.0000 | ORAL_TABLET | Freq: Two times a day (BID) | ORAL | Status: DC

## 2013-10-26 NOTE — Patient Instructions (Signed)

## 2013-10-26 NOTE — Progress Notes (Signed)
Pre visit review using our clinic review tool, if applicable. No additional management support is needed unless otherwise documented below in the visit note. 

## 2013-10-27 DIAGNOSIS — H6693 Otitis media, unspecified, bilateral: Secondary | ICD-10-CM | POA: Insufficient documentation

## 2013-10-27 NOTE — Assessment & Plan Note (Signed)
Symptoms and exam are consistent with viral upper respiratory infection with secondary bacterial otitis media. Will start Augmentin. Will use Auralgan as needed for ear pain. Tessalon as needed for cough. Encouraged rest, adequate fluid intake, and Tylenol or ibuprofen as needed for pain. Followup for recheck of ears in 2-4 weeks or sooner as needed.

## 2013-10-27 NOTE — Progress Notes (Signed)
Subjective:    Patient ID: Morgan Byrd, female    DOB: [birth year], 77 y.o.   MRN: [id number]  HPI 77 year old female with h/o HTN presents for acute visit complaining of one-week history of nasal congestion, bilateral ear pain, subjective fever, non-productive cough, and fatigue. She has been using over-the-counter cough and cold medications with no improvement. She denies any shortness of breath, chest pain.   Outpatient Encounter Prescriptions as of 10/26/2013  Medication Sig  . ACTONEL 150 MG tablet take 1 tablet by mouth every month  . aspirin 81 MG EC tablet Take 81 mg by mouth daily. Swallow whole.  . hydrochlorothiazide (MICROZIDE) 12.5 MG capsule take 1 capsule by mouth once daily  . losartan (COZAAR) 100 MG tablet Take 1 tablet (100 mg total) by mouth daily.  . meloxicam (MOBIC) 7.5 MG tablet Take 1 tablet (7.5 mg total) by mouth daily.  . metoprolol (LOPRESSOR) 50 MG tablet Take 1 tablet (50 mg total) by mouth 2 (two) times daily.  Marland Kitchen omeprazole (PRILOSEC) 20 MG capsule take 1 capsule by mouth once daily   BP 136/78  Pulse 73  Temp(Src) 97.8 F (36.6 C) (Oral)  Wt 179 lb (81.194 kg)  SpO2 94%  Review of Systems  Constitutional: Positive for fever and fatigue. Negative for chills and unexpected weight change.  HENT: Positive for congestion, ear pain, postnasal drip and rhinorrhea. Negative for ear discharge, facial swelling, hearing loss, mouth sores, nosebleeds, sinus pressure, sneezing, sore throat, tinnitus, trouble swallowing and voice change.   Eyes: Negative for pain, discharge, redness and visual disturbance.  Respiratory: Positive for cough. Negative for chest tightness, shortness of breath, wheezing and stridor.   Cardiovascular: Negative for chest pain, palpitations and leg swelling.  Musculoskeletal: Negative for arthralgias, myalgias, neck pain and neck stiffness.  Skin: Negative for color change and rash.  Neurological: Negative for dizziness, weakness,  light-headedness and headaches.  Hematological: Negative for adenopathy.       Objective:   Physical Exam  Constitutional: She is oriented to person, place, and time. She appears well-developed and well-nourished. No distress.  HENT:  Head: Normocephalic and atraumatic.  Right Ear: External ear normal. Tympanic membrane is erythematous and bulging. A middle ear effusion is present.  Left Ear: External ear normal. Tympanic membrane is erythematous and bulging. A middle ear effusion is present.  Nose: Nose normal.  Mouth/Throat: Oropharynx is clear and moist. No oropharyngeal exudate.  Eyes: Conjunctivae are normal. Pupils are equal, round, and reactive to light. Right eye exhibits no discharge. Left eye exhibits no discharge. No scleral icterus.  Neck: Normal range of motion. Neck supple. No tracheal deviation present. No thyromegaly present.  Cardiovascular: Normal rate, regular rhythm, normal heart sounds and intact distal pulses.  Exam reveals no gallop and no friction rub.   No murmur heard. Pulmonary/Chest: Effort normal and breath sounds normal. No accessory muscle usage. Not tachypneic. No respiratory distress. She has no decreased breath sounds. She has no wheezes. She has no rhonchi. She has no rales. She exhibits no tenderness.  Musculoskeletal: Normal range of motion. She exhibits no edema and no tenderness.  Lymphadenopathy:    She has no cervical adenopathy.  Neurological: She is alert and oriented to person, place, and time. No cranial nerve deficit. She exhibits normal muscle tone. Coordination normal.  Skin: Skin is warm and dry. No rash noted. She is not diaphoretic. No erythema. No pallor.  Psychiatric: She has a normal mood and affect. Her behavior is normal.  Judgment and thought content normal.          Assessment & Plan:

## 2013-11-09 ENCOUNTER — Ambulatory Visit (INDEPENDENT_AMBULATORY_CARE_PROVIDER_SITE_OTHER): Payer: Medicare Other | Admitting: Internal Medicine

## 2013-11-09 ENCOUNTER — Encounter: Payer: Self-pay | Admitting: Internal Medicine

## 2013-11-09 VITALS — BP 138/74 | HR 67 | Temp 97.8°F | Wt 180.0 lb

## 2013-11-09 DIAGNOSIS — H6693 Otitis media, unspecified, bilateral: Secondary | ICD-10-CM

## 2013-11-09 DIAGNOSIS — H6690 Otitis media, unspecified, unspecified ear: Secondary | ICD-10-CM

## 2013-11-09 NOTE — Progress Notes (Signed)
Subjective:    Patient ID: Morgan Byrd, female    DOB: 1936-03-22, 78 y.o.   MRN: [id number]  HPI 78-year-old female with history of hypertension presents for followup of recent episode of bilateral otitis media. She reports that ear pain has resolved with antibiotic treatment. She has completed Augmentin. She denies any recurrent ear pain, fever, chills, nasal congestion, cough. She is generally feeling well with no concerns today.  Outpatient Encounter Prescriptions as of 11/09/2013  Medication Sig  . ACTONEL 150 MG tablet take 1 tablet by mouth every month  . aspirin 81 MG EC tablet Take 81 mg by mouth daily. Swallow whole.  . hydrochlorothiazide (MICROZIDE) 12.5 MG capsule take 1 capsule by mouth once daily  . losartan (COZAAR) 100 MG tablet Take 1 tablet (100 mg total) by mouth daily.  . meloxicam (MOBIC) 7.5 MG tablet Take 1 tablet (7.5 mg total) by mouth daily.  . metoprolol (LOPRESSOR) 50 MG tablet Take 1 tablet (50 mg total) by mouth 2 (two) times daily.  Marland Kitchen omeprazole (PRILOSEC) 20 MG capsule take 1 capsule by mouth once daily  . [DISCONTINUED] amoxicillin-clavulanate (AUGMENTIN) 875-125 MG per tablet Take 1 tablet by mouth 2 (two) times daily.  . [DISCONTINUED] antipyrine-benzocaine (AURALGAN) otic solution Place 3-4 drops into both ears every 2 (two) hours as needed for ear pain.  . [DISCONTINUED] benzonatate (TESSALON) 200 MG capsule Take 1 capsule (200 mg total) by mouth 2 (two) times daily as needed for cough.   BP 138/74  Pulse 67  Temp(Src) 97.8 F (36.6 C) (Oral)  Wt 180 lb (81.647 kg)  SpO2 96%  Review of Systems  Constitutional: Negative for fever, chills and unexpected weight change.  HENT: Negative for congestion, ear discharge, ear pain, facial swelling, hearing loss, mouth sores, nosebleeds, postnasal drip, rhinorrhea, sinus pressure, sneezing, sore throat, tinnitus, trouble swallowing and voice change.   Eyes: Negative for pain, discharge, redness and visual  disturbance.  Respiratory: Negative for cough, chest tightness, shortness of breath, wheezing and stridor.   Cardiovascular: Negative for chest pain, palpitations and leg swelling.  Musculoskeletal: Negative for arthralgias, myalgias, neck pain and neck stiffness.  Skin: Negative for color change and rash.  Neurological: Negative for dizziness, weakness, light-headedness and headaches.  Hematological: Negative for adenopathy.       Objective:   Physical Exam  Constitutional: She is oriented to person, place, and time. She appears well-developed and well-nourished. No distress.  HENT:  Head: Normocephalic and atraumatic.  Right Ear: Tympanic membrane, external ear and ear canal normal.  Left Ear: Tympanic membrane, external ear and ear canal normal.  Nose: Nose normal.  Mouth/Throat: Oropharynx is clear and moist. No oropharyngeal exudate.  Eyes: Conjunctivae are normal. Pupils are equal, round, and reactive to light. Right eye exhibits no discharge. Left eye exhibits no discharge. No scleral icterus.  Neck: Normal range of motion. Neck supple. No tracheal deviation present. No thyromegaly present.  Cardiovascular: Normal rate, regular rhythm, normal heart sounds and intact distal pulses.  Exam reveals no gallop and no friction rub.   No murmur heard. Pulmonary/Chest: Effort normal and breath sounds normal. No accessory muscle usage. Not tachypneic. No respiratory distress. She has no decreased breath sounds. She has no wheezes. She has no rhonchi. She has no rales. She exhibits no tenderness.  Musculoskeletal: Normal range of motion. She exhibits no edema and no tenderness.  Lymphadenopathy:    She has no cervical adenopathy.  Neurological: She is alert and oriented to person,  place, and time. No cranial nerve deficit. She exhibits normal muscle tone. Coordination normal.  Skin: Skin is warm and dry. No rash noted. She is not diaphoretic. No erythema. No pallor.  Psychiatric: She has a  normal mood and affect. Her behavior is normal. Judgment and thought content normal.          Assessment & Plan:

## 2013-11-09 NOTE — Assessment & Plan Note (Signed)
Symptoms have resolved. Exam normal today. Will continue to monitor. Follow up prn.

## 2013-11-09 NOTE — Progress Notes (Signed)
Pre-visit discussion using our clinic review tool. No additional management support is needed unless otherwise documented below in the visit note.  

## 2013-11-12 ENCOUNTER — Telehealth: Payer: Self-pay | Admitting: Internal Medicine

## 2013-11-12 NOTE — Telephone Encounter (Signed)
Spoke with patient she is doing a little better. Per patient she is not having the diarrhea as much and been snacking. Informed patient to continue to eat a bland diet and push the fluids just as she is doing. But if she feels like she is getting worse or not keeping down much go to the ED. Patient agreed.

## 2013-11-12 NOTE — Telephone Encounter (Signed)
Can you please check in with her and make sure she is tolerating fluids at home?

## 2013-11-12 NOTE — Telephone Encounter (Signed)
FYI to Dr. Walker 

## 2013-11-12 NOTE — Telephone Encounter (Signed)
Patient Information:  Caller Name: Farzana  Phone: [phone]  Patient: Morgan Byrd, Morgan Byrd  Gender: Female  DOB: 1936/01/18  Age: 78 Years  PCP: Ronette Deter (Adults only)  Office Follow Up:  Does the office need to follow up with this patient?: No  Instructions For The Office: N/A  RN Note:  Patient states she does not feel up to coming in today;  Reviewed home care and signs of dehydration.  States holding sips/clears down, trying to drink, and urinating.  Denies dizziness.  Symptoms  Reason For Call & Symptoms: Vomiting until about 0230 am 11/12/13;  Diarrhea that has continues this am.  Reviewed Health History In EMR: Yes  Reviewed Medications In EMR: Yes  Reviewed Allergies In EMR: Yes  Reviewed Surgeries / Procedures: Yes  Date of Onset of Symptoms: 11/11/2013  Guideline(s) Used:  Diarrhea  Disposition Per Guideline:   Go to Office Now  Reason For Disposition Reached:   Age > 60 years and has had > 6 diarrhea stools in past 24 hours  Advice Given:  Reassurance:  In healthy adults, new-onset diarrhea is usually caused by a viral infection of the intestines, which you can treat at home. Diarrhea is the body's way of getting rid of the infection. Here are some tips on how to keep ahead of the fluid losses.  Here is some care advice that should help.  Fluids:  Drink more fluids, at least 8-10 glasses (8 oz or 240 ml) daily.  For example: sports drinks, diluted fruit juices, soft drinks.  Supplement this with saltine crackers or soups to make certain that you are getting sufficient fluid and salt to meet your body's needs.  Nutrition:  Maintaining some food intake during episodes of diarrhea is important.  Ideal initial foods include boiled starches/cereals (e.g., potatoes, rice, noodles, wheat, oats) with a small amount of salt to taste.  Other acceptable foods include: bananas, yogurt, crackers, soup.  As your stools return to normal consistency, resume a normal diet.  Diarrhea Medication - Bismuth Subsalicylate (e.g., Kaopectate, PeptoBismol):  Helps reduce diarrhea, vomiting, and abdominal cramping.  Adult dosage: 2 tablets or 2 tablespoons (30 ml) by mouth every hour if diarrhea continues to a maximum of 8 doses in a 24-hour period.  Do not use for more than 2 days  This medication can make the stools look dark or even black (but not red or tarry).  Diarrhea Medication  - Imodium AD:   Helps reduce diarrhea.  Adult dosage: 4 mg (2 capsules or 4 teaspoons or 20 ml) is the recommended first dose. You may take an additional 2 mg (1 capsule or 2 teaspoons or 10 ml) after each loose BM.  Maximum dosage: 16 mg (8 capsules or 16 teaspoons or 80 ml).  Caution: Do not use if you have a fever greater than 100F (37.8C). Do not use if there is blood or mucus in your stools. Do not use for more than 2 days.  Expected Course:  Viral diarrhea lasts 4-7 days. Always worse on days 1 and 2.  Call Back If:  Signs of dehydration occur (e.g., no urine for more than 12 hours, very dry mouth, lightheaded, etc.)  Diarrhea lasts over 7 days  You become worse.  Patient Refused Recommendation:  Patient Will Follow Up With Office Later  Doesn't feel well enough to come in.

## 2014-05-03 ENCOUNTER — Encounter: Payer: Self-pay | Admitting: Internal Medicine

## 2014-05-03 ENCOUNTER — Ambulatory Visit (INDEPENDENT_AMBULATORY_CARE_PROVIDER_SITE_OTHER): Payer: Medicare Other | Admitting: Internal Medicine

## 2014-05-03 VITALS — BP 142/68 | HR 58 | Temp 97.8°F | Ht 61.5 in | Wt 166.8 lb

## 2014-05-03 DIAGNOSIS — M810 Age-related osteoporosis without current pathological fracture: Secondary | ICD-10-CM

## 2014-05-03 DIAGNOSIS — Z0000 Encounter for general adult medical examination without abnormal findings: Secondary | ICD-10-CM

## 2014-05-03 DIAGNOSIS — M545 Low back pain, unspecified: Secondary | ICD-10-CM

## 2014-05-03 DIAGNOSIS — Z23 Encounter for immunization: Secondary | ICD-10-CM

## 2014-05-03 DIAGNOSIS — E785 Hyperlipidemia, unspecified: Secondary | ICD-10-CM

## 2014-05-03 DIAGNOSIS — I10 Essential (primary) hypertension: Secondary | ICD-10-CM

## 2014-05-03 DIAGNOSIS — Z1239 Encounter for other screening for malignant neoplasm of breast: Secondary | ICD-10-CM

## 2014-05-03 DIAGNOSIS — E669 Obesity, unspecified: Secondary | ICD-10-CM | POA: Insufficient documentation

## 2014-05-03 LAB — CBC WITH DIFFERENTIAL/PLATELET
Basophils Absolute: 0 10*3/uL (ref 0.0–0.1)
Basophils Relative: 0.7 % (ref 0.0–3.0)
Eosinophils Absolute: 0.2 10*3/uL (ref 0.0–0.7)
Eosinophils Relative: 4 % (ref 0.0–5.0)
HCT: 41.3 % (ref 36.0–46.0)
Hemoglobin: 14.1 g/dL (ref 12.0–15.0)
Lymphocytes Relative: 28.8 % (ref 12.0–46.0)
Lymphs Abs: 1.8 10*3/uL (ref 0.7–4.0)
MCHC: 34.1 g/dL (ref 30.0–36.0)
MCV: 94.3 fl (ref 78.0–100.0)
Monocytes Absolute: 0.6 10*3/uL (ref 0.1–1.0)
Monocytes Relative: 9.2 % (ref 3.0–12.0)
Neutro Abs: 3.5 10*3/uL (ref 1.4–7.7)
Neutrophils Relative %: 57.3 % (ref 43.0–77.0)
Platelets: 230 10*3/uL (ref 150.0–400.0)
RBC: 4.39 Mil/uL (ref 3.87–5.11)
RDW: 13.5 % (ref 11.5–15.5)
WBC: 6.1 10*3/uL (ref 4.0–10.5)

## 2014-05-03 LAB — COMPREHENSIVE METABOLIC PANEL
ALT: 27 U/L (ref 0–35)
AST: 32 U/L (ref 0–37)
Albumin: 4.2 g/dL (ref 3.5–5.2)
Alkaline Phosphatase: 47 U/L (ref 39–117)
BUN: 30 mg/dL — ABNORMAL HIGH (ref 6–23)
CO2: 29 mEq/L (ref 19–32)
Calcium: 9.7 mg/dL (ref 8.4–10.5)
Chloride: 104 mEq/L (ref 96–112)
Creatinine, Ser: 1 mg/dL (ref 0.4–1.2)
GFR: 60.13 mL/min (ref >60.00)
Glucose, Bld: 92 mg/dL (ref 70–99)
Potassium: 4.8 mEq/L (ref 3.5–5.1)
Sodium: 140 mEq/L (ref 135–145)
Total Bilirubin: 0.7 mg/dL (ref 0.2–1.2)
Total Protein: 7.4 g/dL (ref 6.0–8.3)

## 2014-05-03 LAB — LIPID PANEL
Cholesterol: 169 mg/dL (ref 0–200)
HDL: 56.9 mg/dL (ref >39.00)
LDL Cholesterol: 90 mg/dL (ref 0–99)
NonHDL: 112.1
Total CHOL/HDL Ratio: 3
Triglycerides: 110 mg/dL (ref 0.0–149.0)
VLDL: 22 mg/dL (ref 0.0–40.0)

## 2014-05-03 LAB — MICROALBUMIN / CREATININE URINE RATIO
Creatinine,U: 117.2 mg/dL
Microalb Creat Ratio: 0.5 mg/g (ref 0.0–30.0)
Microalb, Ur: 0.6 mg/dL (ref 0.0–1.9)

## 2014-05-03 LAB — VITAMIN D 25 HYDROXY (VIT D DEFICIENCY, FRACTURES): VITD: 43.19 ng/mL

## 2014-05-03 MED ORDER — LOSARTAN POTASSIUM 100 MG PO TABS: 100.0000 mg | ORAL_TABLET | Freq: Every day | ORAL | Status: DC

## 2014-05-03 MED ORDER — METOPROLOL TARTRATE 50 MG PO TABS: 50.0000 mg | ORAL_TABLET | Freq: Two times a day (BID) | ORAL | Status: DC

## 2014-05-03 MED ORDER — ALPRAZOLAM 0.25 MG PO TABS: 0.2500 mg | ORAL_TABLET | Freq: Three times a day (TID) | ORAL | Status: DC | PRN

## 2014-05-03 MED ORDER — RISEDRONATE SODIUM 150 MG PO TABS: 150.0000 mg | ORAL_TABLET | ORAL | Status: DC

## 2014-05-03 MED ORDER — AMOXICILLIN 500 MG PO CAPS: 2000.0000 mg | ORAL_CAPSULE | Freq: Once | ORAL | Status: DC | PRN

## 2014-05-03 MED ORDER — MELOXICAM 7.5 MG PO TABS: 7.5000 mg | ORAL_TABLET | Freq: Every day | ORAL | Status: DC

## 2014-05-03 MED ORDER — OMEPRAZOLE 20 MG PO CPDR: 20.0000 mg | DELAYED_RELEASE_CAPSULE | Freq: Every day | ORAL | Status: DC

## 2014-05-03 MED ORDER — HYDROCHLOROTHIAZIDE 12.5 MG PO CAPS: 12.5000 mg | ORAL_CAPSULE | Freq: Every day | ORAL | Status: DC

## 2014-05-03 NOTE — Progress Notes (Signed)
Pre visit review using our clinic review tool, if applicable. No additional management support is needed unless otherwise documented below in the visit note. 

## 2014-05-03 NOTE — Assessment & Plan Note (Signed)
Wt Readings from Last 3 Encounters:  05/03/14 166 lb 12 oz (75.637 kg)  11/09/13 180 lb (81.647 kg)  10/26/13 179 lb (81.194 kg)   Body mass index is 31 kg/(m^2). Congratulated pt on weight loss. Encouraged continue effort at healthy diet and exercise.

## 2014-05-03 NOTE — Assessment & Plan Note (Signed)
Will check lipids with labs. 

## 2014-05-03 NOTE — Patient Instructions (Signed)
It was nice to see you today.  You are doing well.  Continue healthy diet and keep goal of exercising 40 minutes three times per week.  We will schedule your mammogram and bone density testing.  Follow up in 6 months or sooner as needed.

## 2014-05-03 NOTE — Assessment & Plan Note (Signed)
BP Readings from Last 3 Encounters:  05/03/14 142/68  11/09/13 138/74  10/26/13 136/78   BP well controlled generally on current medications. Will continue. Check renal function with labs today.

## 2014-05-03 NOTE — Assessment & Plan Note (Signed)
Secondary to DJD. Symptoms well controlled with Meloxicam. Will continue.

## 2014-05-03 NOTE — Assessment & Plan Note (Signed)
General medical exam including breast exam normal today. Pelvic exam deferred given age and preference, s/p hysterectomy. Mammogram ordered. Will check labs today including CBC, CMP, lipids. Encouraged continued efforts at healthy diet and regular physical activity. Prevnar given today. Mammogram and bone density testing ordered. Colonoscopy UTD.

## 2014-05-03 NOTE — Progress Notes (Signed)
Subjective:    Patient ID: Morgan Byrd, female    DOB: [birth year], 78 y.o.   MRN: [id number]  HPI The patient is here for annual Medicare wellness examination and management of other chronic and acute problems.   The risk factors are reflected in the social history.  The roster of all physicians providing medical care to patient - is listed in the Snapshot section of the chart.  Activities of daily living:  The patient is 100% independent in all ADLs: dressing, toileting, feeding as well as independent mobility. Lives with husband in townhome.   Home safety : The patient has smoke detectors in the home. They wear seatbelts.  There are no firearms at home. There is no violence in the home.   There is no risks for hepatitis, STDs or HIV. There is no history of blood transfusion. They have no travel history to infectious disease endemic areas of the world.  The patient has seen their dentist in the last six month. [facility] They have seen their eye doctor in the last year. Dr. Ellin Mayhew No issues with .They have deferred audiologic testing in the last year.   They do not  have excessive sun exposure. Discussed the need for sun protection: hats, long sleeves and use of sunscreen if there is significant sun exposure. Dr. Aubery Lapping  Diet: the importance of a healthy diet is discussed. They do have a relatively healthy diet.  The benefits of regular aerobic exercise were discussed. She walks typically 5 days per week. Swims 3 days per week.  Depression screen: there are no signs or vegative symptoms of depression- irritability, change in appetite, anhedonia, sadness/tearfullness.  Cognitive assessment: the patient manages all their financial and personal affairs and is actively engaged. They could relate day,date,year and events.  The following portions of the patient's history were reviewed and updated as appropriate: allergies, current medications, past family history, past  medical history,  past surgical history, past social history  and problem list.  Visual acuity was not assessed per patient preference since she has regular follow up with her ophthalmologist. Hearing and body mass index were assessed and reviewed.   During the course of the visit the patient was educated and counseled about appropriate screening and preventive services including : fall prevention , diabetes screening, nutrition counseling, colorectal cancer screening, and recommended immunizations.     Review of Systems  Constitutional: Negative for fever, chills, appetite change, fatigue and unexpected weight change.  Eyes: Negative for visual disturbance.  Respiratory: Negative for shortness of breath.   Cardiovascular: Negative for chest pain and leg swelling.  Gastrointestinal: Negative for abdominal pain.  Skin: Negative for color change and rash.  Hematological: Negative for adenopathy. Does not bruise/bleed easily.  Psychiatric/Behavioral: Negative for dysphoric mood. The patient is not nervous/anxious.        Objective:    BP 142/68  Pulse 58  Temp(Src) 97.8 F (36.6 C) (Oral)  Ht 5' 1.5" (1.562 m)  Wt 166 lb 12 oz (75.637 kg)  BMI 31.00 kg/m2  SpO2 94% Physical Exam  Constitutional: She is oriented to person, place, and time. She appears well-developed and well-nourished. No distress.  HENT:  Head: Normocephalic and atraumatic.  Right Ear: External ear normal.  Left Ear: External ear normal.  Nose: Nose normal.  Mouth/Throat: Oropharynx is clear and moist. No oropharyngeal exudate.  Eyes: Conjunctivae are normal. Pupils are equal, round, and reactive to light. Right eye exhibits no discharge. Left eye exhibits  no discharge. No scleral icterus.  Neck: Normal range of motion. Neck supple. No tracheal deviation present. No thyromegaly present.  Cardiovascular: Normal rate, regular rhythm, normal heart sounds and intact distal pulses.  Exam reveals no gallop and no  friction rub.   No murmur heard. Pulmonary/Chest: Effort normal and breath sounds normal. No accessory muscle usage. Not tachypneic. No respiratory distress. She has no decreased breath sounds. She has no wheezes. She has no rales. She exhibits no tenderness. Right breast exhibits no inverted nipple, no mass, no nipple discharge, no skin change and no tenderness. Left breast exhibits no inverted nipple, no mass, no nipple discharge, no skin change and no tenderness. Breasts are symmetrical.  Abdominal: Soft. Bowel sounds are normal. She exhibits no distension and no mass. There is no tenderness. There is no rebound and no guarding.  Musculoskeletal: Normal range of motion. She exhibits no edema and no tenderness.  Lymphadenopathy:    She has no cervical adenopathy.  Neurological: She is alert and oriented to person, place, and time. No cranial nerve deficit. She exhibits normal muscle tone. Coordination normal.  Skin: Skin is warm and dry. No rash noted. She is not diaphoretic. No erythema. No pallor.  Psychiatric: She has a normal mood and affect. Her behavior is normal. Judgment and thought content normal.          Assessment & Plan:   Problem List Items Addressed This Visit     Unprioritized   Hypertension      BP Readings from Last 3 Encounters:  05/03/14 142/68  11/09/13 138/74  10/26/13 136/78   BP well controlled generally on current medications. Will continue. Check renal function with labs today.    Relevant Medications      metoprolol (LOPRESSOR) tablet      losartan (COZAAR) tablet      hydrochlorothiazide (MICROZIDE) 12.5 MG capsule   Other Relevant Orders      Comprehensive metabolic panel      Microalbumin / creatinine urine ratio   Low back pain     Secondary to DJD. Symptoms well controlled with Meloxicam. Will continue.    Relevant Medications      meloxicam (MOBIC) tablet   Medicare annual wellness visit, subsequent - Primary     General medical exam  including breast exam normal today. Pelvic exam deferred given age and preference, s/p hysterectomy. Mammogram ordered. Will check labs today including CBC, CMP, lipids. Encouraged continued efforts at healthy diet and regular physical activity. Prevnar given today. Mammogram and bone density testing ordered. Colonoscopy UTD.       Relevant Orders      CBC with Differential   Obesity (BMI 30-39.9)      Wt Readings from Last 3 Encounters:  05/03/14 166 lb 12 oz (75.637 kg)  11/09/13 180 lb (81.647 kg)  10/26/13 179 lb (81.194 kg)   Body mass index is 31 kg/(m^2). Congratulated pt on weight loss. Encouraged continue effort at healthy diet and exercise.    Osteoporosis     Repeat bone density testing ordered. Vit D ordered.    Relevant Medications      risedronate (ACTONEL) tablet   Other and unspecified hyperlipidemia     Will check lipids with labs.    Relevant Medications      metoprolol (LOPRESSOR) tablet      losartan (COZAAR) tablet      hydrochlorothiazide (MICROZIDE) 12.5 MG capsule   Other Relevant Orders      Lipid panel  Screening for breast cancer   Relevant Orders      MM Digital Screening    Other Visit Diagnoses   Osteoporosis, unspecified        Relevant Medications       risedronate (ACTONEL) tablet    Other Relevant Orders       DG Bone Density       Vit D  25 hydroxy (rtn osteoporosis monitoring)    Need for prophylactic vaccination against Streptococcus pneumoniae (pneumococcus)        Relevant Orders       Pneumococcal conjugate vaccine 13-valent (Completed)        Return in about 6 months (around 11/02/2014) for Recheck.

## 2014-05-03 NOTE — Assessment & Plan Note (Signed)
Repeat bone density testing ordered. Vit D ordered.

## 2014-05-06 ENCOUNTER — Telehealth: Payer: Self-pay | Admitting: Internal Medicine

## 2014-05-06 NOTE — Telephone Encounter (Signed)
Relevant patient education assigned to patient using Emmi. ° °

## 2014-06-06 ENCOUNTER — Encounter: Payer: Self-pay | Admitting: *Deleted

## 2014-06-06 ENCOUNTER — Ambulatory Visit: Payer: Self-pay | Admitting: Internal Medicine

## 2014-06-06 LAB — HM DEXA SCAN

## 2014-06-06 LAB — HM MAMMOGRAPHY: HM Mammogram: NEGATIVE

## 2014-06-07 ENCOUNTER — Encounter: Payer: Self-pay | Admitting: *Deleted

## 2014-06-10 ENCOUNTER — Telehealth: Payer: Self-pay | Admitting: Internal Medicine

## 2014-06-10 NOTE — Telephone Encounter (Signed)
Can we see if aug 17th would work for this f/u on bone density test. Thanks

## 2014-06-10 NOTE — Telephone Encounter (Signed)
Bone density testing showed osteopenia. T-score -2.4. Can you please set up pt a visit to discuss this. Thanks

## 2014-06-11 NOTE — Telephone Encounter (Signed)
The patient has been scheduled and is aware of her appointment.

## 2014-06-11 NOTE — Telephone Encounter (Signed)
Left message for the patient to give me a call back to schedule her appointment with Dr. Gilford Rile to discuss her bone density

## 2014-06-24 ENCOUNTER — Ambulatory Visit (INDEPENDENT_AMBULATORY_CARE_PROVIDER_SITE_OTHER): Payer: Medicare Other | Admitting: Internal Medicine

## 2014-06-24 ENCOUNTER — Encounter: Payer: Self-pay | Admitting: Internal Medicine

## 2014-06-24 VITALS — BP 132/64 | HR 54 | Temp 98.4°F | Wt 159.5 lb

## 2014-06-24 DIAGNOSIS — M810 Age-related osteoporosis without current pathological fracture: Secondary | ICD-10-CM

## 2014-06-24 NOTE — Patient Instructions (Signed)
Osteoporosis Throughout your life, your body breaks down old bone and replaces it with new bone. As you get older, your body does not replace bone as quickly as it breaks it down. By the age of 30 years, most people begin to gradually lose bone because of the imbalance between bone loss and replacement. Some people lose more bone than others. Bone loss beyond a specified normal degree is considered osteoporosis.  Osteoporosis affects the strength and durability of your bones. The inside of the ends of your bones and your flat bones, like the bones of your pelvis, look like honeycomb, filled with tiny open spaces. As bone loss occurs, your bones become less dense. This means that the open spaces inside your bones become bigger and the walls between these spaces become thinner. This makes your bones weaker. Bones of a person with osteoporosis can become so weak that they can break (fracture) during minor accidents, such as a simple fall. CAUSES  The following factors have been associated with the development of osteoporosis:  Smoking.  Drinking more than 2 alcoholic drinks several days per week.  Long-term use of certain medicines:  Corticosteroids.  Chemotherapy medicines.  Thyroid medicines.  Antiepileptic medicines.  Gonadal hormone suppression medicine.  Immunosuppression medicine.  Being underweight.  Lack of physical activity.  Lack of exposure to the sun. This can lead to vitamin D deficiency.  Certain medical conditions:  Certain inflammatory bowel diseases, such as Crohn disease and ulcerative colitis.  Diabetes.  Hyperthyroidism.  Hyperparathyroidism. RISK FACTORS Anyone can develop osteoporosis. However, the following factors can increase your risk of developing osteoporosis:  Gender--Women are at higher risk than men.  Age--Being older than 50 years increases your risk.  Ethnicity--White and Asian people have an increased risk.  Weight --Being extremely  underweight can increase your risk of osteoporosis.  Family history of osteoporosis--Having a family member who has developed osteoporosis can increase your risk. SYMPTOMS  Usually, people with osteoporosis have no symptoms.  DIAGNOSIS  Signs during a physical exam that may prompt your caregiver to suspect osteoporosis include:  Decreased height. This is usually caused by the compression of the bones that form your spine (vertebrae) because they have weakened and become fractured.  A curving or rounding of the upper back (kyphosis). To confirm signs of osteoporosis, your caregiver may request a procedure that uses 2 low-dose X-ray beams with different levels of energy to measure your bone mineral density (dual-energy X-ray absorptiometry [DXA]). Also, your caregiver may check your level of vitamin D. TREATMENT  The goal of osteoporosis treatment is to strengthen bones in order to decrease the risk of bone fractures. There are different types of medicines available to help achieve this goal. Some of these medicines work by slowing the processes of bone loss. Some medicines work by increasing bone density. Treatment also involves making sure that your levels of calcium and vitamin D are adequate. PREVENTION  There are things you can do to help prevent osteoporosis. Adequate intake of calcium and vitamin D can help you achieve optimal bone mineral density. Regular exercise can also help, especially resistance and weight-bearing activities. If you smoke, quitting smoking is an important part of osteoporosis prevention. MAKE SURE YOU:  Understand these instructions.  Will watch your condition.  Will get help right away if you are not doing well or get worse. FOR MORE INFORMATION www.osteo.org and www.nof.org Document Released: 08/04/2005 Document Revised: 02/19/2013 Document Reviewed: 10/09/2011 Exit[employer] Patient Information 2015 [employer]. This information is not   intended to replace advice  given to you by your health care provider. Make sure you discuss any questions you have with your health care provider.  

## 2014-06-24 NOTE — Progress Notes (Signed)
   Subjective:    Patient ID: Morgan Byrd, female    DOB: 1936-07-17, 78 y.o.   MRN: [id number]  HPI 78YO female presents for follow up.  Ostoepenia - Recent bone density testing showed T-score -2.4. Pt has been taking Actonel for 2-3 years, but occasionally misses some doses. Also taking Ca, Mag, and Vit D. Started exercise program with husband swimming 1hr 3x per week. No previous h/o fracture. Sister has osteoporosis.  Review of Systems  Constitutional: Negative for fever, chills, appetite change, fatigue and unexpected weight change.  Eyes: Negative for visual disturbance.  Respiratory: Negative for shortness of breath.   Cardiovascular: Negative for chest pain and leg swelling.  Gastrointestinal: Negative for abdominal pain.  Skin: Negative for color change and rash.  Hematological: Negative for adenopathy. Does not bruise/bleed easily.  Psychiatric/Behavioral: Negative for dysphoric mood. The patient is not nervous/anxious.        Objective:    BP 132/64  Pulse 54  Temp(Src) 98.4 F (36.9 C) (Oral)  Wt 159 lb 8 oz (72.349 kg)  SpO2 97% Physical Exam  Constitutional: She is oriented to person, place, and time. She appears well-developed and well-nourished. No distress.  HENT:  Head: Normocephalic and atraumatic.  Right Ear: External ear normal.  Left Ear: External ear normal.  Nose: Nose normal.  Mouth/Throat: Oropharynx is clear and moist.  Eyes: Conjunctivae are normal. Pupils are equal, round, and reactive to light. Right eye exhibits no discharge. Left eye exhibits no discharge. No scleral icterus.  Neck: Normal range of motion. Neck supple. No tracheal deviation present. No thyromegaly present.  Cardiovascular: Normal rate, regular rhythm, normal heart sounds and intact distal pulses.  Exam reveals no gallop and no friction rub.   No murmur heard. Pulmonary/Chest: Effort normal and breath sounds normal. No accessory muscle usage. Not tachypneic. No respiratory  distress. She has no decreased breath sounds. She has no wheezes. She has no rhonchi. She has no rales. She exhibits no tenderness.  Musculoskeletal: Normal range of motion. She exhibits no edema and no tenderness.  Lymphadenopathy:    She has no cervical adenopathy.  Neurological: She is alert and oriented to person, place, and time. No cranial nerve deficit. She exhibits normal muscle tone. Coordination normal.  Skin: Skin is warm and dry. No rash noted. She is not diaphoretic. No erythema. No pallor.  Psychiatric: She has a normal mood and affect. Her behavior is normal. Judgment and thought content normal.          Assessment & Plan:   Problem List Items Addressed This Visit     Unprioritized   Osteoporosis - Primary     Reviewed recent bone density testing with pt and her husband. Also reviewed previous records from [place]. Discussed treatment of osteoporosis. Will continue Actonel. Continue Vit D supplement. Recent Vit D level normal. Encouraged regular weight bearing activity. Encouraged adequate dietary Ca intake. Follow up bone density testing in 2017.  Over 91min of which >50% spent in face-to-face contact with patient discussing plan of care         Return if symptoms worsen or fail to improve.

## 2014-06-24 NOTE — Assessment & Plan Note (Signed)
Reviewed recent bone density testing with pt and her husband. Also reviewed previous records from [place]. Discussed treatment of osteoporosis. Will continue Actonel. Continue Vit D supplement. Recent Vit D level normal. Encouraged regular weight bearing activity. Encouraged adequate dietary Ca intake. Follow up bone density testing in 2017.  Over 4min of which >50% spent in face-to-face contact with patient discussing plan of care

## 2014-06-24 NOTE — Progress Notes (Signed)
Pre visit review using our clinic review tool, if applicable. No additional management support is needed unless otherwise documented below in the visit note. 

## 2014-07-08 ENCOUNTER — Telehealth: Payer: Self-pay | Admitting: *Deleted

## 2014-07-08 MED ORDER — AMOXICILLIN 500 MG PO CAPS: 2000.0000 mg | ORAL_CAPSULE | Freq: Once | ORAL | Status: DC | PRN

## 2014-07-08 NOTE — Telephone Encounter (Signed)
Amoxicillin 2gm taken by mouth 1 hr prior to dental procedure.

## 2014-07-08 NOTE — Telephone Encounter (Addendum)
Pt called in requesting a refill on Amoxicillin, she states she has a dental appoint on tomorrow 9.1.15.  Please advise refill.

## 2014-08-24 ENCOUNTER — Ambulatory Visit (INDEPENDENT_AMBULATORY_CARE_PROVIDER_SITE_OTHER): Payer: Medicare Other

## 2014-08-24 DIAGNOSIS — Z23 Encounter for immunization: Secondary | ICD-10-CM

## 2014-11-04 ENCOUNTER — Encounter: Payer: Self-pay | Admitting: Internal Medicine

## 2014-11-04 ENCOUNTER — Ambulatory Visit (INDEPENDENT_AMBULATORY_CARE_PROVIDER_SITE_OTHER): Payer: Medicare Other | Admitting: Internal Medicine

## 2014-11-04 VITALS — BP 109/66 | HR 50 | Temp 98.1°F | Ht 61.5 in | Wt 156.2 lb

## 2014-11-04 DIAGNOSIS — I7300 Raynaud's syndrome without gangrene: Secondary | ICD-10-CM

## 2014-11-04 DIAGNOSIS — I10 Essential (primary) hypertension: Secondary | ICD-10-CM

## 2014-11-04 LAB — COMPREHENSIVE METABOLIC PANEL
ALT: 35 U/L (ref 0–35)
AST: 42 U/L — ABNORMAL HIGH (ref 0–37)
Albumin: 4 g/dL (ref 3.5–5.2)
Alkaline Phosphatase: 59 U/L (ref 39–117)
BUN: 17 mg/dL (ref 6–23)
CO2: 32 mEq/L (ref 19–32)
Calcium: 9.5 mg/dL (ref 8.4–10.5)
Chloride: 103 mEq/L (ref 96–112)
Creatinine, Ser: 0.8 mg/dL (ref 0.4–1.2)
GFR: 76.31 mL/min (ref >60.00)
Glucose, Bld: 89 mg/dL (ref 70–99)
Potassium: 4.4 mEq/L (ref 3.5–5.1)
Sodium: 140 mEq/L (ref 135–145)
Total Bilirubin: 0.8 mg/dL (ref 0.2–1.2)
Total Protein: 7 g/dL (ref 6.0–8.3)

## 2014-11-04 MED ORDER — OMEPRAZOLE 20 MG PO CPDR: 20.0000 mg | DELAYED_RELEASE_CAPSULE | Freq: Every day | ORAL | Status: DC

## 2014-11-04 NOTE — Assessment & Plan Note (Signed)
BP Readings from Last 3 Encounters:  11/04/14 109/66  06/24/14 132/64  05/03/14 142/68   BP well controlled. Renal function with labs today. Continue current medications.

## 2014-11-04 NOTE — Progress Notes (Signed)
Pre visit review using our clinic review tool, if applicable. No additional management support is needed unless otherwise documented below in the visit note. 

## 2014-11-04 NOTE — Assessment & Plan Note (Signed)
Discussed Raynauds phenomenon and prevention as well as treatment. Discussed possibly changing BP meds to add Ca channel blocker to help with symptoms. She would like to hold off for now. She will call if symptoms are not improving.

## 2014-11-04 NOTE — Patient Instructions (Signed)
Labs today.  Follow up in 6 months. 

## 2014-11-04 NOTE — Progress Notes (Signed)
   Subjective:    Patient ID: Morgan Byrd, female    DOB: 28-Dec-1935, 78 y.o.   MRN: [id number]  HPI 78YO female presents for follow up.  Feeling well in general. Only concern today is worsening symptoms of Raynauds with pain and discoloration of fingertips, to white color when exposed to cold, such as when holding a cold beverage. Symptoms improve with warming hands.  Past medical, surgical, family and social history per today's encounter.  Review of Systems  Constitutional: Negative for fever, chills, appetite change, fatigue and unexpected weight change.  Eyes: Negative for visual disturbance.  Respiratory: Negative for shortness of breath.   Cardiovascular: Negative for chest pain and leg swelling.  Gastrointestinal: Negative for nausea, vomiting, abdominal pain, diarrhea and constipation.  Skin: Positive for color change (fingertips in cold). Negative for rash.  Hematological: Negative for adenopathy. Does not bruise/bleed easily.  Psychiatric/Behavioral: Negative for sleep disturbance and dysphoric mood. The patient is not nervous/anxious.        Objective:    BP 109/66 mmHg  Pulse 50  Temp(Src) 98.1 F (36.7 C) (Oral)  Ht 5' 1.5" (1.562 m)  Wt 156 lb 4 oz (70.875 kg)  BMI 29.05 kg/m2  SpO2 96% Physical Exam  Constitutional: She is oriented to person, place, and time. She appears well-developed and well-nourished. No distress.  HENT:  Head: Normocephalic and atraumatic.  Right Ear: External ear normal.  Left Ear: External ear normal.  Nose: Nose normal.  Mouth/Throat: Oropharynx is clear and moist. No oropharyngeal exudate.  Eyes: Conjunctivae are normal. Pupils are equal, round, and reactive to light. Right eye exhibits no discharge. Left eye exhibits no discharge. No scleral icterus.  Neck: Normal range of motion. Neck supple. No tracheal deviation present. No thyromegaly present.  Cardiovascular: Normal rate, regular rhythm, normal heart sounds and intact distal  pulses.  Exam reveals no gallop and no friction rub.   No murmur heard. Pulmonary/Chest: Effort normal and breath sounds normal. No accessory muscle usage. No tachypnea. No respiratory distress. She has no decreased breath sounds. She has no wheezes. She has no rhonchi. She has no rales. She exhibits no tenderness.  Musculoskeletal: Normal range of motion. She exhibits no edema or tenderness.  Lymphadenopathy:    She has no cervical adenopathy.  Neurological: She is alert and oriented to person, place, and time. No cranial nerve deficit. She exhibits normal muscle tone. Coordination normal.  Skin: Skin is warm and dry. No rash noted. She is not diaphoretic. No erythema. No pallor.  Psychiatric: She has a normal mood and affect. Her behavior is normal. Judgment and thought content normal.          Assessment & Plan:   Problem List Items Addressed This Visit      Unprioritized   Hypertension - Primary    BP Readings from Last 3 Encounters:  11/04/14 109/66  06/24/14 132/64  05/03/14 142/68   BP well controlled. Renal function with labs today. Continue current medications.    Relevant Orders      Comprehensive metabolic panel   Raynaud phenomenon    Discussed Raynauds phenomenon and prevention as well as treatment. Discussed possibly changing BP meds to add Ca channel blocker to help with symptoms. She would like to hold off for now. She will call if symptoms are not improving.        Return in about 6 months (around 05/06/2015) for Wellness Visit.

## 2015-01-16 DIAGNOSIS — Z08 Encounter for follow-up examination after completed treatment for malignant neoplasm: Secondary | ICD-10-CM | POA: Diagnosis not present

## 2015-01-16 DIAGNOSIS — L570 Actinic keratosis: Secondary | ICD-10-CM | POA: Diagnosis not present

## 2015-01-16 DIAGNOSIS — L821 Other seborrheic keratosis: Secondary | ICD-10-CM | POA: Diagnosis not present

## 2015-01-16 DIAGNOSIS — Z85828 Personal history of other malignant neoplasm of skin: Secondary | ICD-10-CM | POA: Diagnosis not present

## 2015-01-16 DIAGNOSIS — Z1283 Encounter for screening for malignant neoplasm of skin: Secondary | ICD-10-CM | POA: Diagnosis not present

## 2015-02-03 DIAGNOSIS — H43819 Vitreous degeneration, unspecified eye: Secondary | ICD-10-CM | POA: Diagnosis not present

## 2015-04-25 ENCOUNTER — Other Ambulatory Visit: Payer: Self-pay

## 2015-04-25 MED ORDER — HYDROCHLOROTHIAZIDE 12.5 MG PO CAPS: 12.5000 mg | ORAL_CAPSULE | Freq: Every day | ORAL | Status: DC

## 2015-05-05 ENCOUNTER — Ambulatory Visit (INDEPENDENT_AMBULATORY_CARE_PROVIDER_SITE_OTHER): Payer: Medicare Other | Admitting: Internal Medicine

## 2015-05-05 ENCOUNTER — Encounter: Payer: Self-pay | Admitting: Internal Medicine

## 2015-05-05 VITALS — BP 137/73 | HR 51 | Temp 98.0°F | Ht 61.5 in | Wt 156.5 lb

## 2015-05-05 DIAGNOSIS — E785 Hyperlipidemia, unspecified: Secondary | ICD-10-CM

## 2015-05-05 DIAGNOSIS — Z1239 Encounter for other screening for malignant neoplasm of breast: Secondary | ICD-10-CM | POA: Diagnosis not present

## 2015-05-05 DIAGNOSIS — M5442 Lumbago with sciatica, left side: Secondary | ICD-10-CM | POA: Diagnosis not present

## 2015-05-05 DIAGNOSIS — I10 Essential (primary) hypertension: Secondary | ICD-10-CM | POA: Diagnosis not present

## 2015-05-05 LAB — COMPREHENSIVE METABOLIC PANEL
ALT: 20 U/L (ref 0–35)
AST: 25 U/L (ref 0–37)
Albumin: 4.2 g/dL (ref 3.5–5.2)
Alkaline Phosphatase: 50 U/L (ref 39–117)
BUN: 19 mg/dL (ref 6–23)
CO2: 32 mEq/L (ref 19–32)
Calcium: 10.5 mg/dL (ref 8.4–10.5)
Chloride: 102 mEq/L (ref 96–112)
Creatinine, Ser: 0.79 mg/dL (ref 0.40–1.20)
GFR: 75.1 mL/min (ref >60.00)
Glucose, Bld: 88 mg/dL (ref 70–99)
Potassium: 4.4 mEq/L (ref 3.5–5.1)
Sodium: 139 mEq/L (ref 135–145)
Total Bilirubin: 0.8 mg/dL (ref 0.2–1.2)
Total Protein: 7.6 g/dL (ref 6.0–8.3)

## 2015-05-05 LAB — LIPID PANEL
Cholesterol: 174 mg/dL (ref 0–200)
HDL: 60.5 mg/dL (ref >39.00)
LDL Cholesterol: 100 mg/dL — ABNORMAL HIGH (ref 0–99)
NonHDL: 113.5
Total CHOL/HDL Ratio: 3
Triglycerides: 69 mg/dL (ref 0.0–149.0)
VLDL: 13.8 mg/dL (ref 0.0–40.0)

## 2015-05-05 NOTE — Assessment & Plan Note (Signed)
Will check lipids with labs. 

## 2015-05-05 NOTE — Assessment & Plan Note (Signed)
BP Readings from Last 3 Encounters:  05/05/15 137/73  11/04/14 109/66  06/24/14 132/64   BP well controlled. Renal function with labs. Continue current medications.

## 2015-05-05 NOTE — Progress Notes (Signed)
Pre visit review using our clinic review tool, if applicable. No additional management support is needed unless otherwise documented below in the visit note. 

## 2015-05-05 NOTE — Patient Instructions (Signed)
Labs today

## 2015-05-05 NOTE — Assessment & Plan Note (Addendum)
Low back pain with left sciatica which has been chronic. Will continue Meloxicam. Discussed possible referral to Dr. Sharlet Salina to consider local steroid injection. She prefers to hold off for now.

## 2015-05-05 NOTE — Assessment & Plan Note (Signed)
Mammogram ordered

## 2015-05-05 NOTE — Progress Notes (Signed)
Subjective:    Patient ID: Morgan Byrd, female    DOB: 11-19-1935, 79 y.o.   MRN: [id number]  HPI  79YO female presents for follow up.  Generally, feeling well. Continues to have occasional left lower back pain that radiates to left lower leg. Worsened by increased physical activity. Improved with rest. Not taking any medication other than Meloxicam for this. Prefers not to pursue additional treatment at this time.  HTN - Compliant with medication. No CP, HA, palpitations.  Past medical, surgical, family and social history per today's encounter.  Review of Systems  Constitutional: Negative for fever, chills, appetite change, fatigue and unexpected weight change.  Eyes: Negative for visual disturbance.  Respiratory: Negative for shortness of breath.   Cardiovascular: Negative for chest pain, palpitations and leg swelling.  Gastrointestinal: Negative for abdominal pain.  Musculoskeletal: Positive for myalgias and arthralgias.  Skin: Negative for color change and rash.  Neurological: Positive for numbness. Negative for weakness and headaches.  Hematological: Negative for adenopathy. Does not bruise/bleed easily.  Psychiatric/Behavioral: Negative for dysphoric mood. The patient is not nervous/anxious.        Objective:    BP 137/73 mmHg  Pulse 51  Temp(Src) 98 F (36.7 C) (Oral)  Ht 5' 1.5" (1.562 m)  Wt 156 lb 8 oz (70.988 kg)  BMI 29.10 kg/m2  SpO2 96% Physical Exam  Constitutional: She is oriented to person, place, and time. She appears well-developed and well-nourished. No distress.  HENT:  Head: Normocephalic and atraumatic.  Right Ear: External ear normal.  Left Ear: External ear normal.  Nose: Nose normal.  Mouth/Throat: Oropharynx is clear and moist. No oropharyngeal exudate.  Eyes: Conjunctivae are normal. Pupils are equal, round, and reactive to light. Right eye exhibits no discharge. Left eye exhibits no discharge. No scleral icterus.  Neck: Normal range  of motion. Neck supple. No tracheal deviation present. No thyromegaly present.  Cardiovascular: Normal rate, regular rhythm, normal heart sounds and intact distal pulses.  Exam reveals no gallop and no friction rub.   No murmur heard. Pulmonary/Chest: Effort normal and breath sounds normal. No respiratory distress. She has no wheezes. She has no rales. She exhibits no tenderness.  Musculoskeletal: Normal range of motion. She exhibits no edema or tenderness.  Lymphadenopathy:    She has no cervical adenopathy.  Neurological: She is alert and oriented to person, place, and time. No cranial nerve deficit. She exhibits normal muscle tone. Coordination normal.  Skin: Skin is warm and dry. No rash noted. She is not diaphoretic. No erythema. No pallor.  Psychiatric: She has a normal mood and affect. Her behavior is normal. Judgment and thought content normal.          Assessment & Plan:   Problem List Items Addressed This Visit      Unprioritized   Hyperlipidemia    Will check lipids with labs.      Relevant Orders   Lipid panel   Hypertension - Primary    BP Readings from Last 3 Encounters:  05/05/15 137/73  11/04/14 109/66  06/24/14 132/64   BP well controlled. Renal function with labs. Continue current medications.      Relevant Orders   Comprehensive metabolic panel   Low back pain    Low back pain with left sciatica which has been chronic. Will continue Meloxicam. Discussed possible referral to Dr. Sharlet Salina to consider local steroid injection. She prefers to hold off for now.      Screening for breast cancer  Mammogram ordered      Relevant Orders   MM Digital Screening       Return in about 3 months (around 08/05/2015) for Wellness Visit.

## 2015-05-13 ENCOUNTER — Other Ambulatory Visit: Payer: Self-pay | Admitting: Internal Medicine

## 2015-06-09 ENCOUNTER — Ambulatory Visit
Admission: RE | Admit: 2015-06-09 | Discharge: 2015-06-09 | Disposition: A | Discharge status: Home / Self Care | Payer: Medicare Other | Source: Ambulatory Visit | Attending: Internal Medicine | Admitting: Internal Medicine

## 2015-06-09 ENCOUNTER — Other Ambulatory Visit: Payer: Self-pay | Admitting: Internal Medicine

## 2015-06-09 DIAGNOSIS — Z1231 Encounter for screening mammogram for malignant neoplasm of breast: Secondary | ICD-10-CM | POA: Diagnosis not present

## 2015-06-09 DIAGNOSIS — Z1239 Encounter for other screening for malignant neoplasm of breast: Secondary | ICD-10-CM

## 2015-06-09 LAB — HM MAMMOGRAPHY

## 2015-06-25 ENCOUNTER — Other Ambulatory Visit: Payer: Self-pay | Admitting: *Deleted

## 2015-06-25 NOTE — Telephone Encounter (Signed)
Pt called requesting a prophylactic antibiotic for an upcoming dental appoint due to a previous knee replacement.  Please advise

## 2015-06-25 NOTE — Telephone Encounter (Signed)
Fine to call in Amoxicillin 2gm po x1  Given 1-2 hr prior to procedure.

## 2015-06-26 ENCOUNTER — Other Ambulatory Visit: Payer: Self-pay | Admitting: *Deleted

## 2015-06-26 MED ORDER — AMOXICILLIN 500 MG PO CAPS: 2000.0000 mg | ORAL_CAPSULE | Freq: Once | ORAL | Status: DC | PRN

## 2015-06-26 NOTE — Telephone Encounter (Signed)
Handled by [employer]

## 2015-07-17 ENCOUNTER — Other Ambulatory Visit: Payer: Self-pay | Admitting: Internal Medicine

## 2015-07-23 ENCOUNTER — Other Ambulatory Visit: Payer: Self-pay | Admitting: *Deleted

## 2015-07-23 DIAGNOSIS — M545 Low back pain, unspecified: Secondary | ICD-10-CM

## 2015-07-23 MED ORDER — MELOXICAM 7.5 MG PO TABS: 7.5000 mg | ORAL_TABLET | Freq: Every day | ORAL | Status: DC

## 2015-08-05 ENCOUNTER — Encounter: Payer: Self-pay | Admitting: Internal Medicine

## 2015-08-05 ENCOUNTER — Ambulatory Visit (INDEPENDENT_AMBULATORY_CARE_PROVIDER_SITE_OTHER): Payer: Medicare Other | Admitting: Internal Medicine

## 2015-08-05 VITALS — BP 144/70 | HR 52 | Temp 98.0°F | Ht 62.0 in | Wt 163.0 lb

## 2015-08-05 DIAGNOSIS — Z0000 Encounter for general adult medical examination without abnormal findings: Secondary | ICD-10-CM

## 2015-08-05 DIAGNOSIS — Z23 Encounter for immunization: Secondary | ICD-10-CM

## 2015-08-05 NOTE — Assessment & Plan Note (Signed)
General physical exam including breast exam normal today. PAP and pelvic deferred as s/p hysterectomy. Mammogram UTD and reviewed. Colonoscopy UTD and reviewed. Flu vaccine today. Encouraged healthy diet and exercise.Reviewed labs from 04/2015.

## 2015-08-05 NOTE — Progress Notes (Signed)
Pre visit review using our clinic review tool, if applicable. No additional management support is needed unless otherwise documented below in the visit note. 

## 2015-08-05 NOTE — Patient Instructions (Signed)

## 2015-08-05 NOTE — Progress Notes (Signed)
Subjective:    Patient ID: Morgan Byrd, female    DOB: [birth year], 79 y.o.   MRN: [id number]  HPI  The patient is here for annual Medicare wellness examination and management of other chronic and acute problems.   The risk factors are reflected in the social history.  The roster of all physicians providing medical care to patient - is listed in the Snapshot section of the chart.  Activities of daily living:  The patient is 100% independent in all ADLs: dressing, toileting, feeding as well as independent mobility. Lives with husband in townhome.   Home safety : The patient has smoke detectors in the home. They wear seatbelts.  There are no firearms at home. There is no violence in the home.   There is no risks for hepatitis, STDs or HIV. There is no history of blood transfusion. They have no travel history to infectious disease endemic areas of the world.  The patient has seen their dentist in the last six month. [facility] They have seen their eye doctor in the last year. Dr. Ellin Mayhew No issues with .They have deferred audiologic testing in the last year.   They do not  have excessive sun exposure. Discussed the need for sun protection: hats, long sleeves and use of sunscreen if there is significant sun exposure. Dr. Aubery Lapping  Diet: the importance of a healthy diet is discussed. They do have a relatively healthy diet.  The benefits of regular aerobic exercise were discussed. She walks typically 5 days per week. Swimming limited some this summer.  Depression screen: there are no signs or vegative symptoms of depression- irritability, change in appetite, anhedonia, sadness/tearfullness.  Cognitive assessment: the patient manages all their financial and personal affairs and is actively engaged. They could relate day,date,year and events.  HCPOA - son, Morgan Byrd Living Will - in place  The following portions of the patient's history were reviewed and updated as  appropriate: allergies, current medications, past family history, past medical history,  past surgical history, past social history  and problem list.  Visual acuity was not assessed per patient preference since she has regular follow up with her ophthalmologist. Hearing and body mass index were assessed and reviewed.   During the course of the visit the patient was educated and counseled about appropriate screening and preventive services including : fall prevention , diabetes screening, nutrition counseling, colorectal cancer screening, and recommended immunizations.      Wt Readings from Last 3 Encounters:  08/05/15 163 lb (73.936 kg)  05/05/15 156 lb 8 oz (70.988 kg)  11/04/14 156 lb 4 oz (70.875 kg)   BP Readings from Last 3 Encounters:  08/05/15 144/70  05/05/15 137/73  11/04/14 109/66    Past Medical History  Diagnosis Date  . Hypertension    Family History  Problem Relation Age of Onset  . Dementia Mother   . Arthritis Mother   . Heart disease Father   . Hypertension Father   . Cancer Father     prostate  . Diabetes Father   . Diabetes Sister   . Cancer Sister     breast  . Breast cancer Sister 71   Past Surgical History  Procedure Laterality Date  . Replacement total knee bilateral    . Abdominal hysterectomy    . Bladder suspension    . Shoulder surgery    . Vaginal delivery      2  . Breast biopsy Right 1978  neg   Social History   Social History  . Marital Status: Married    Spouse Name: N/A  . Number of Children: N/A  . Years of Education: N/A   Social History Main Topics  . Smoking status: Never Smoker   . Smokeless tobacco: None  . Alcohol Use: No  . Drug Use: None  . Sexual Activity: Not Asked   Other Topics Concern  . None   Social History Narrative   Lives in [place] with husband. Son and daughter live nearby.      Work - retired Network engineer      Diet - healthy, regular   Exercise - housework    Review of Systems    Constitutional: Negative for fever, chills, appetite change, fatigue and unexpected weight change.  HENT: Negative for congestion and trouble swallowing.   Eyes: Negative for visual disturbance.  Respiratory: Negative for shortness of breath.   Cardiovascular: Negative for chest pain and leg swelling.  Gastrointestinal: Negative for nausea, vomiting, abdominal pain, diarrhea and constipation.  Musculoskeletal: Positive for myalgias, back pain and arthralgias. Negative for joint swelling and gait problem.  Skin: Negative for color change and rash.  Neurological: Negative for weakness.  Hematological: Negative for adenopathy. Does not bruise/bleed easily.  Psychiatric/Behavioral: Negative for suicidal ideas, sleep disturbance and dysphoric mood. The patient is not nervous/anxious.        Objective:    BP 144/70 mmHg  Pulse 52  Temp(Src) 98 F (36.7 C) (Oral)  Ht 5\' 2"  (1.575 m)  Wt 163 lb (73.936 kg)  BMI 29.81 kg/m2  SpO2 95% Physical Exam  Constitutional: She is oriented to person, place, and time. She appears well-developed and well-nourished. No distress.  HENT:  Head: Normocephalic and atraumatic.  Right Ear: External ear normal.  Left Ear: External ear normal.  Nose: Nose normal.  Mouth/Throat: Oropharynx is clear and moist. No oropharyngeal exudate.  Eyes: Conjunctivae are normal. Pupils are equal, round, and reactive to light. Right eye exhibits no discharge. Left eye exhibits no discharge. No scleral icterus.  Neck: Normal range of motion. Neck supple. No tracheal deviation present. No thyromegaly present.  Cardiovascular: Normal rate, regular rhythm, normal heart sounds and intact distal pulses.  Exam reveals no gallop and no friction rub.   No murmur heard. Pulmonary/Chest: Effort normal and breath sounds normal. No accessory muscle usage. No tachypnea. No respiratory distress. She has no decreased breath sounds. She has no wheezes. She has no rales. She exhibits no  tenderness. Right breast exhibits no inverted nipple, no mass, no nipple discharge, no skin change and no tenderness. Left breast exhibits no inverted nipple, no mass, no nipple discharge, no skin change and no tenderness. Breasts are symmetrical.  Abdominal: Soft. Bowel sounds are normal. She exhibits no distension and no mass. There is no tenderness. There is no rebound and no guarding.  Musculoskeletal: Normal range of motion. She exhibits no edema or tenderness.  Lymphadenopathy:    She has no cervical adenopathy.  Neurological: She is alert and oriented to person, place, and time. No cranial nerve deficit. She exhibits normal muscle tone. Coordination normal.  Skin: Skin is warm and dry. No rash noted. She is not diaphoretic. No erythema. No pallor.  Few scattered tan SKs over chest wall and numerous dark pink/purple telangiectasias over chest wall  Psychiatric: She has a normal mood and affect. Her behavior is normal. Judgment and thought content normal.          Assessment &  Plan:  Patient was given a handout regarding current recommendations for health maintenance and preventative care on the AVS.  Problem List Items Addressed This Visit      Unprioritized   Medicare annual wellness visit, subsequent - Primary    Wellness visit completed today. Health maintenance UTD. Encouraged healthy diet and exercise. Flu vaccine today. Labs from 04/2015 reviewed. Follow up 6 months and prn.      Routine general medical examination at a health care facility    General physical exam including breast exam normal today. PAP and pelvic deferred as s/p hysterectomy. Mammogram UTD and reviewed. Colonoscopy UTD and reviewed. Flu vaccine today. Encouraged healthy diet and exercise.Reviewed labs from 04/2015.          Return in about 6 months (around 02/02/2016) for Recheck.

## 2015-08-05 NOTE — Assessment & Plan Note (Signed)
Wellness visit completed today. Health maintenance UTD. Encouraged healthy diet and exercise. Flu vaccine today. Labs from 04/2015 reviewed. Follow up 6 months and prn.

## 2015-09-23 DIAGNOSIS — Z85828 Personal history of other malignant neoplasm of skin: Secondary | ICD-10-CM | POA: Diagnosis not present

## 2015-09-23 DIAGNOSIS — C4441 Basal cell carcinoma of skin of scalp and neck: Secondary | ICD-10-CM | POA: Diagnosis not present

## 2015-09-23 DIAGNOSIS — D485 Neoplasm of uncertain behavior of skin: Secondary | ICD-10-CM | POA: Diagnosis not present

## 2015-09-23 DIAGNOSIS — Z08 Encounter for follow-up examination after completed treatment for malignant neoplasm: Secondary | ICD-10-CM | POA: Diagnosis not present

## 2015-09-23 DIAGNOSIS — L570 Actinic keratosis: Secondary | ICD-10-CM | POA: Diagnosis not present

## 2015-09-23 DIAGNOSIS — Z1283 Encounter for screening for malignant neoplasm of skin: Secondary | ICD-10-CM | POA: Diagnosis not present

## 2015-09-23 DIAGNOSIS — L821 Other seborrheic keratosis: Secondary | ICD-10-CM | POA: Diagnosis not present

## 2015-10-15 DIAGNOSIS — C4441 Basal cell carcinoma of skin of scalp and neck: Secondary | ICD-10-CM | POA: Diagnosis not present

## 2015-11-28 DIAGNOSIS — D126 Benign neoplasm of colon, unspecified: Secondary | ICD-10-CM | POA: Diagnosis not present

## 2015-12-26 ENCOUNTER — Ambulatory Visit: Payer: Medicare Other | Admitting: Anesthesiology

## 2015-12-26 ENCOUNTER — Encounter
Admission: RE | Disposition: A | Discharge status: Home / Self Care | Payer: Self-pay | Source: Ambulatory Visit | Attending: Unknown Physician Specialty

## 2015-12-26 ENCOUNTER — Encounter: Payer: Self-pay | Admitting: *Deleted

## 2015-12-26 ENCOUNTER — Ambulatory Visit
Admission: RE | Admit: 2015-12-26 | Discharge: 2015-12-26 | Disposition: A | Discharge status: Home / Self Care | Payer: Medicare Other | Source: Ambulatory Visit | Attending: Unknown Physician Specialty | Admitting: Unknown Physician Specialty

## 2015-12-26 DIAGNOSIS — Z885 Allergy status to narcotic agent status: Secondary | ICD-10-CM | POA: Diagnosis not present

## 2015-12-26 DIAGNOSIS — Z79899 Other long term (current) drug therapy: Secondary | ICD-10-CM | POA: Diagnosis not present

## 2015-12-26 DIAGNOSIS — Z7982 Long term (current) use of aspirin: Secondary | ICD-10-CM | POA: Diagnosis not present

## 2015-12-26 DIAGNOSIS — Z86010 Personal history of colonic polyps: Secondary | ICD-10-CM | POA: Insufficient documentation

## 2015-12-26 DIAGNOSIS — F419 Anxiety disorder, unspecified: Secondary | ICD-10-CM | POA: Diagnosis not present

## 2015-12-26 DIAGNOSIS — K648 Other hemorrhoids: Secondary | ICD-10-CM | POA: Diagnosis not present

## 2015-12-26 DIAGNOSIS — Z1211 Encounter for screening for malignant neoplasm of colon: Secondary | ICD-10-CM | POA: Diagnosis not present

## 2015-12-26 DIAGNOSIS — K5790 Diverticulosis of intestine, part unspecified, without perforation or abscess without bleeding: Secondary | ICD-10-CM | POA: Diagnosis not present

## 2015-12-26 DIAGNOSIS — I10 Essential (primary) hypertension: Secondary | ICD-10-CM | POA: Diagnosis not present

## 2015-12-26 DIAGNOSIS — K5730 Diverticulosis of large intestine without perforation or abscess without bleeding: Secondary | ICD-10-CM | POA: Diagnosis not present

## 2015-12-26 HISTORY — PX: COLONOSCOPY WITH PROPOFOL: SHX5780

## 2015-12-26 HISTORY — DX: Anxiety disorder, unspecified: F41.9

## 2015-12-26 LAB — HM COLONOSCOPY

## 2015-12-26 SURGERY — COLONOSCOPY WITH PROPOFOL
Anesthesia: General

## 2015-12-26 MED ORDER — PROPOFOL 500 MG/50ML IV EMUL
INTRAVENOUS | Status: DC | PRN
  Administered 2015-12-26: 80 ug/kg/min via INTRAVENOUS

## 2015-12-26 MED ORDER — ONDANSETRON HCL 4 MG/2ML IJ SOLN
INTRAMUSCULAR | Status: AC
  Filled 2015-12-26: qty 2

## 2015-12-26 MED ORDER — SODIUM CHLORIDE 0.9 % IV SOLN
INTRAVENOUS | Status: DC
Start: 2015-12-26 — End: 2015-12-26
  Administered 2015-12-26: 1000 mL via INTRAVENOUS

## 2015-12-26 MED ORDER — GLYCOPYRROLATE 0.2 MG/ML IJ SOLN
INTRAMUSCULAR | Status: DC | PRN
  Administered 2015-12-26: 0.2 mg via INTRAVENOUS

## 2015-12-26 MED ORDER — SODIUM CHLORIDE 0.9 % IV SOLN: INTRAVENOUS | Status: DC

## 2015-12-26 MED ORDER — PIPERACILLIN-TAZOBACTAM 3.375 G IVPB
3.3750 g | Freq: Once | INTRAVENOUS | Status: AC
  Administered 2015-12-26: 3.375 g via INTRAVENOUS
  Filled 2015-12-26: qty 50

## 2015-12-26 MED ORDER — MIDAZOLAM HCL 2 MG/2ML IJ SOLN
INTRAMUSCULAR | Status: DC | PRN
  Administered 2015-12-26 (×2): 1 mg via INTRAVENOUS

## 2015-12-26 MED ORDER — EPHEDRINE SULFATE 50 MG/ML IJ SOLN
INTRAMUSCULAR | Status: DC | PRN
  Administered 2015-12-26 (×2): 10 mg via INTRAVENOUS

## 2015-12-26 MED ORDER — SODIUM CHLORIDE 0.9 % IV SOLN
INTRAVENOUS | Status: DC
Start: 2015-12-26 — End: 2015-12-26

## 2015-12-26 NOTE — Transfer of Care (Signed)
Immediate Anesthesia Transfer of Care Note  Patient: Morgan Byrd  Procedure(s) Performed: Procedure(s): COLONOSCOPY WITH PROPOFOL (N/A)  Patient Location: PACU  Anesthesia Type:General  Level of Consciousness: sedated  Airway & Oxygen Therapy: Patient Spontanous Breathing and Patient connected to nasal cannula oxygen  Post-op Assessment: Report given to RN and Post -op Vital signs reviewed and stable  Post vital signs: Reviewed and stable  Last Vitals:  Filed Vitals:   12/26/15 0846  BP: 126/52  Pulse: 51  Temp: 35.7 C  Resp: 16    Complications: No apparent anesthesia complications

## 2015-12-26 NOTE — H&P (Signed)
Primary Care Physician:  Rica Mast, MD Primary Gastroenterologist:  Dr. Vira Agar  Pre-Procedure History & Physical: HPI:  Morgan Byrd is a 80 y.o. female is here for an colonoscopy.   Past Medical History  Diagnosis Date  . Hypertension   . Anxiety     Past Surgical History  Procedure Laterality Date  . Replacement total knee bilateral    . Abdominal hysterectomy    . Bladder suspension    . Shoulder surgery    . Vaginal delivery      2  . Breast biopsy Right 1978    neg    Prior to Admission medications   Medication Sig Start Date End Date Taking? Authorizing Provider  ACTONEL 150 MG tablet take 1 tablet by mouth every month with with water on empty stomach (DO NOT EAT, DRINK, OR LIE DOWN FOR 30 MINUTES AFTER TAKING) 05/14/15  Yes Jackolyn Confer, MD  ALPRAZolam Duanne Moron) 0.25 MG tablet Take 1 tablet (0.25 mg total) by mouth 3 (three) times daily as needed for anxiety. 05/03/14  Yes Jackolyn Confer, MD  aspirin 81 MG EC tablet Take 81 mg by mouth daily. Swallow whole.   Yes Historical Provider, MD  B Complex-C (SUPER B COMPLEX PO) Take by mouth.   Yes Historical Provider, MD  CALCIUM-MAGNESIUM-ZINC PO Take by mouth.   Yes Historical Provider, MD  cholecalciferol (VITAMIN D) 1000 UNITS tablet Take 1,000 Units by mouth daily.   Yes Historical Provider, MD  hydrochlorothiazide (MICROZIDE) 12.5 MG capsule Take 1 capsule (12.5 mg total) by mouth daily. 04/25/15  Yes Jackolyn Confer, MD  losartan (COZAAR) 100 MG tablet take 1 tablet by mouth once daily 07/18/15  Yes Jackolyn Confer, MD  meloxicam (MOBIC) 7.5 MG tablet Take 1 tablet (7.5 mg total) by mouth daily. 07/23/15  Yes Jackolyn Confer, MD  metoprolol (LOPRESSOR) 50 MG tablet take 1 tablet by mouth twice a day 07/18/15  Yes Jackolyn Confer, MD  Multiple Vitamin (MULTIVITAMIN) capsule Take 1 capsule by mouth daily.   Yes Historical Provider, MD  amoxicillin (AMOXIL) 500 MG capsule Take 4 capsules (2,000 mg  total) by mouth once as needed. Take 1-2 hours prior to dental procedure Patient not taking: Reported on 12/26/2015 06/26/15   Jackolyn Confer, MD    Allergies as of 12/16/2015 - Review Complete 08/05/2015  Allergen Reaction Noted  . Codeine Nausea And Vomiting 04/25/2012    Family History  Problem Relation Age of Onset  . Dementia Mother   . Arthritis Mother   . Heart disease Father   . Hypertension Father   . Cancer Father     prostate  . Diabetes Father   . Diabetes Sister   . Cancer Sister     breast  . Breast cancer Sister 57    Social History   Social History  . Marital Status: Married    Spouse Name: N/A  . Number of Children: N/A  . Years of Education: N/A   Occupational History  . Not on file.   Social History Main Topics  . Smoking status: Never Smoker   . Smokeless tobacco: Not on file  . Alcohol Use: No  . Drug Use: Not on file  . Sexual Activity: Not on file   Other Topics Concern  . Not on file   Social History Narrative   Lives in [place] with husband. Son and daughter live nearby.      Work - retired Network engineer  Diet - healthy, regular   Exercise - housework    Review of Systems: See HPI, otherwise negative ROS  Physical Exam: BP 126/52 mmHg  Pulse 51  Temp(Src) 96.3 F (35.7 C) (Tympanic)  Resp 16  Ht 5\' 2"  (1.575 m)  Wt 72.576 kg (160 lb)  BMI 29.26 kg/m2  SpO2 98% General:   Alert,  pleasant and cooperative in NAD Head:  Normocephalic and atraumatic. Neck:  Supple; no masses or thyromegaly. Lungs:  Clear throughout to auscultation.    Heart:  Regular rate and rhythm. Abdomen:  Soft, nontender and nondistended. Normal bowel sounds, without guarding, and without rebound.   Neurologic:  Alert and  oriented x4;  grossly normal neurologically.  Impression/Plan: Morgan Byrd is here for an colonoscopy to be performed for personal hx of colon polyps  Risks, benefits, limitations, and alternatives regarding  colonoscopy have  been reviewed with the patient.  Questions have been answered.  All parties agreeable.   Gaylyn Cheers, MD  12/26/2015, 9:29 AM

## 2015-12-26 NOTE — Anesthesia Postprocedure Evaluation (Signed)
Anesthesia Post Note  Patient: Morgan Byrd  Procedure(s) Performed: Procedure(s) (LRB): COLONOSCOPY WITH PROPOFOL (N/A)  Patient location during evaluation: PACU Anesthesia Type: General Level of consciousness: awake and alert Pain management: satisfactory to patient Vital Signs Assessment: post-procedure vital signs reviewed and stable Respiratory status: nonlabored ventilation Cardiovascular status: stable Anesthetic complications: no    Last Vitals:  Filed Vitals:   12/26/15 1000 12/26/15 1001  BP: 101/49   Pulse: 65 66  Temp: 36.6 C   Resp: 17 18    Last Pain: There were no vitals filed for this visit.               VAN STAVEREN,Corazon Nickolas

## 2015-12-26 NOTE — Anesthesia Preprocedure Evaluation (Signed)
Anesthesia Evaluation  Patient identified by MRN, date of birth, ID band Patient awake    Reviewed: Allergy & Precautions, NPO status , Patient's Chart, lab work & pertinent test results  Airway Mallampati: II       Dental no notable dental hx.    Pulmonary neg pulmonary ROS,    Pulmonary exam normal        Cardiovascular Exercise Tolerance: Good hypertension, Pt. on medications and Pt. on home beta blockers  Rhythm:Regular Rate:Normal     Neuro/Psych Anxiety    GI/Hepatic negative GI ROS, Neg liver ROS,   Endo/Other  negative endocrine ROS  Renal/GU negative Renal ROS     Musculoskeletal   Abdominal Normal abdominal exam  (+)   Peds  Hematology negative hematology ROS (+)   Anesthesia Other Findings   Reproductive/Obstetrics                             Anesthesia Physical Anesthesia Plan  ASA: II  Anesthesia Plan: General   Post-op Pain Management:    Induction: Intravenous  Airway Management Planned: Natural Airway and Nasal Cannula  Additional Equipment:   Intra-op Plan:   Post-operative Plan:   Informed Consent: I have reviewed the patients History and Physical, chart, labs and discussed the procedure including the risks, benefits and alternatives for the proposed anesthesia with the patient or authorized representative who has indicated his/her understanding and acceptance.     Plan Discussed with: CRNA  Anesthesia Plan Comments:         Anesthesia Quick Evaluation

## 2015-12-26 NOTE — Op Note (Signed)
[facility] Gastroenterology Patient Name: Morgan Byrd Procedure Date: 12/26/2015 9:27 AM MRN: [id number] Account #: [id number] Date of Birth: 07-02-1936 Admit Type: Outpatient Age: 80 Room: [facility] ENDO ROOM 1 Gender: Female Note Status: Finalized Procedure:            Colonoscopy Indications:          High risk colon cancer surveillance: Personal history                        of colonic polyps Providers:            Manya Silvas, MD Referring MD:         Eduard Clos. Gilford Rile, MD (Referring MD) Medicines:            Propofol per Anesthesia Complications:        No immediate complications. Procedure:            Pre-Anesthesia Assessment:                       - After reviewing the risks and benefits, the patient                        was deemed in satisfactory condition to undergo the                        procedure.                       After obtaining informed consent, the colonoscope was                        passed under direct vision. Throughout the procedure,                        the patient's blood pressure, pulse, and oxygen                        saturations were monitored continuously. The                        Colonoscope was introduced through the anus and                        advanced to the the cecum, identified by appendiceal                        orifice and ileocecal valve. The colonoscopy was                        somewhat difficult due to a tortuous colon. Successful                        completion of the procedure was aided by withdrawing                        and reinserting the scope. The patient tolerated the                        procedure well. The quality of the bowel preparation  was excellent. Findings:      A single small-mouthed diverticulum was found in the sigmoid colon.      The exam was otherwise without abnormality. Impression:           - Diverticulosis in the sigmoid colon.           - The examination was otherwise normal.                       - No specimens collected. Recommendation:       - The findings and recommendations were discussed with                        the patient's family. Due to age and no findings on                        this procedure no further colonoscopies needed. Manya Silvas, MD 12/26/2015 9:57:34 AM This report has been signed electronically. Number of Addenda: 0 Note Initiated On: 12/26/2015 9:27 AM Scope Withdrawal Time: 0 hours 9 minutes 28 seconds  Total Procedure Duration: 0 hours 18 minutes 43 seconds       [facility]

## 2015-12-29 ENCOUNTER — Encounter: Payer: Self-pay | Admitting: Unknown Physician Specialty

## 2016-01-09 ENCOUNTER — Encounter: Payer: Self-pay | Admitting: Internal Medicine

## 2016-01-26 ENCOUNTER — Telehealth: Payer: Self-pay

## 2016-01-26 MED ORDER — HYDROCHLOROTHIAZIDE 12.5 MG PO CAPS: 12.5000 mg | ORAL_CAPSULE | Freq: Every day | ORAL | Status: DC

## 2016-01-26 NOTE — Telephone Encounter (Signed)
Pt states that she needed a refill on her HCTZ

## 2016-02-02 ENCOUNTER — Encounter: Payer: Self-pay | Admitting: Internal Medicine

## 2016-02-02 ENCOUNTER — Ambulatory Visit (INDEPENDENT_AMBULATORY_CARE_PROVIDER_SITE_OTHER): Payer: Medicare Other | Admitting: Internal Medicine

## 2016-02-02 VITALS — BP 133/71 | HR 55 | Temp 98.5°F | Ht 62.0 in | Wt 158.2 lb

## 2016-02-02 DIAGNOSIS — R202 Paresthesia of skin: Secondary | ICD-10-CM | POA: Diagnosis not present

## 2016-02-02 DIAGNOSIS — I10 Essential (primary) hypertension: Secondary | ICD-10-CM | POA: Diagnosis not present

## 2016-02-02 DIAGNOSIS — G629 Polyneuropathy, unspecified: Secondary | ICD-10-CM | POA: Insufficient documentation

## 2016-02-02 LAB — COMPREHENSIVE METABOLIC PANEL
ALT: 23 U/L (ref 0–35)
AST: 24 U/L (ref 0–37)
Albumin: 4.2 g/dL (ref 3.5–5.2)
Alkaline Phosphatase: 47 U/L (ref 39–117)
BUN: 21 mg/dL (ref 6–23)
CO2: 33 mEq/L — ABNORMAL HIGH (ref 19–32)
Calcium: 10.1 mg/dL (ref 8.4–10.5)
Chloride: 100 mEq/L (ref 96–112)
Creatinine, Ser: 0.84 mg/dL (ref 0.40–1.20)
GFR: 69.82 mL/min (ref >60.00)
Glucose, Bld: 85 mg/dL (ref 70–99)
Potassium: 3.9 mEq/L (ref 3.5–5.1)
Sodium: 139 mEq/L (ref 135–145)
Total Bilirubin: 0.7 mg/dL (ref 0.2–1.2)
Total Protein: 7.7 g/dL (ref 6.0–8.3)

## 2016-02-02 LAB — CBC WITH DIFFERENTIAL/PLATELET
Basophils Absolute: 0.1 10*3/uL (ref 0.0–0.1)
Basophils Relative: 0.9 % (ref 0.0–3.0)
Eosinophils Absolute: 0.3 10*3/uL (ref 0.0–0.7)
Eosinophils Relative: 5.4 % — ABNORMAL HIGH (ref 0.0–5.0)
HCT: 41.7 % (ref 36.0–46.0)
Hemoglobin: 14.1 g/dL (ref 12.0–15.0)
Lymphocytes Relative: 31.6 % (ref 12.0–46.0)
Lymphs Abs: 1.9 10*3/uL (ref 0.7–4.0)
MCHC: 33.8 g/dL (ref 30.0–36.0)
MCV: 93.4 fl (ref 78.0–100.0)
Monocytes Absolute: 0.7 10*3/uL (ref 0.1–1.0)
Monocytes Relative: 10.8 % (ref 3.0–12.0)
Neutro Abs: 3.1 10*3/uL (ref 1.4–7.7)
Neutrophils Relative %: 51.3 % (ref 43.0–77.0)
Platelets: 249 10*3/uL (ref 150.0–400.0)
RBC: 4.47 Mil/uL (ref 3.87–5.11)
RDW: 13.6 % (ref 11.5–15.5)
WBC: 6 10*3/uL (ref 4.0–10.5)

## 2016-02-02 LAB — VITAMIN B12: Vitamin B-12: 623 pg/mL (ref 211–911)

## 2016-02-02 NOTE — Progress Notes (Signed)
Subjective:    Patient ID: Morgan Byrd, female    DOB: June 22, 1936, 80 y.o.   MRN: [id number]  HPI  80YO female presents for followup.  Burning pain in feet - Bilateral left>right. Started several months ago. Not taking anything for this except for Aleve with some improvement.  Tearful today describing son's recent diagnosis of vasculitis and kidney failure.  HTN - Compliant with medication. No chest pain, HA, palpitations.  Wt Readings from Last 3 Encounters:  02/02/16 158 lb 4 oz (71.782 kg)  12/26/15 160 lb (72.576 kg)  08/05/15 163 lb (73.936 kg)   BP Readings from Last 3 Encounters:  02/02/16 133/71  12/26/15 102/50  08/05/15 144/70    Past Medical History  Diagnosis Date  . Hypertension   . Anxiety    Family History  Problem Relation Age of Onset  . Dementia Mother   . Arthritis Mother   . Heart disease Father   . Hypertension Father   . Cancer Father     prostate  . Diabetes Father   . Diabetes Sister   . Cancer Sister     breast  . Breast cancer Sister 76  . Vasculitis Son   . Kidney disease Son    Past Surgical History  Procedure Laterality Date  . Replacement total knee bilateral    . Abdominal hysterectomy    . Bladder suspension    . Shoulder surgery    . Vaginal delivery      2  . Breast biopsy Right 1978    neg  . Colonoscopy with propofol N/A 12/26/2015    Procedure: COLONOSCOPY WITH PROPOFOL;  Surgeon: Manya Silvas, MD;  Location: [facility] ENDOSCOPY;  Service: Endoscopy;  Laterality: N/A;   Social History   Social History  . Marital Status: Married    Spouse Name: N/A  . Number of Children: N/A  . Years of Education: N/A   Social History Main Topics  . Smoking status: Never Smoker   . Smokeless tobacco: None  . Alcohol Use: No  . Drug Use: None  . Sexual Activity: Not Asked   Other Topics Concern  . None   Social History Narrative   Lives in [place] with husband. Son and daughter live nearby.      Work - retired  Network engineer      Diet - healthy, regular   Exercise - housework    Review of Systems  Constitutional: Negative for fever, chills, appetite change, fatigue and unexpected weight change.  Eyes: Negative for visual disturbance.  Respiratory: Negative for cough and shortness of breath.   Cardiovascular: Negative for chest pain and leg swelling.  Gastrointestinal: Negative for nausea, vomiting, abdominal pain, diarrhea and constipation.  Musculoskeletal: Positive for myalgias and arthralgias.  Skin: Negative for color change and rash.  Neurological: Negative for weakness.  Hematological: Negative for adenopathy. Does not bruise/bleed easily.  Psychiatric/Behavioral: Positive for sleep disturbance and dysphoric mood. The patient is nervous/anxious.        Objective:    BP 133/71 mmHg  Pulse 55  Temp(Src) 98.5 F (36.9 C) (Oral)  Ht 5\' 2"  (1.575 m)  Wt 158 lb 4 oz (71.782 kg)  BMI 28.94 kg/m2  SpO2 96% Physical Exam  Constitutional: She is oriented to person, place, and time. She appears well-developed and well-nourished. No distress.  HENT:  Head: Normocephalic and atraumatic.  Right Ear: External ear normal.  Left Ear: External ear normal.  Nose: Nose normal.  Mouth/Throat: Oropharynx is  clear and moist. No oropharyngeal exudate.  Eyes: Conjunctivae are normal. Pupils are equal, round, and reactive to light. Right eye exhibits no discharge. Left eye exhibits no discharge. No scleral icterus.  Neck: Normal range of motion. Neck supple. No tracheal deviation present. No thyromegaly present.  Cardiovascular: Normal rate, regular rhythm, normal heart sounds and intact distal pulses.  Exam reveals no gallop and no friction rub.   No murmur heard. Pulmonary/Chest: Effort normal and breath sounds normal. No respiratory distress. She has no wheezes. She has no rales. She exhibits no tenderness.  Musculoskeletal: Normal range of motion. She exhibits no edema or tenderness.    Lymphadenopathy:    She has no cervical adenopathy.  Neurological: She is alert and oriented to person, place, and time. She displays no atrophy. No cranial nerve deficit or sensory deficit. She exhibits normal muscle tone. Coordination and gait normal.  Sensation intact to monofilament bilateral feet   Skin: Skin is warm and dry. No rash noted. She is not diaphoretic. No erythema. No pallor.  Psychiatric: She has a normal mood and affect. Her behavior is normal. Judgment and thought content normal.          Assessment & Plan:   Problem List Items Addressed This Visit      Unprioritized   Hypertension    BP Readings from Last 3 Encounters:  02/02/16 133/71  12/26/15 102/50  08/05/15 144/70   BP well controlled. Renal function with labs. Continue current medication.      Paresthesia - Primary    Recent parethesias over feet, most prominent at night. Symptoms most c/w neuropathy. Exam normal.Will check CBC, CMP, B12 with labs.       Relevant Orders   CBC with Differential/Platelet   Comprehensive metabolic panel   [id number]       Return in about 6 months (around 08/04/2016) for Physical.  Ronette Deter, MD [facility]

## 2016-02-02 NOTE — Progress Notes (Signed)
Pre visit review using our clinic review tool, if applicable. No additional management support is needed unless otherwise documented below in the visit note. 

## 2016-02-02 NOTE — Assessment & Plan Note (Signed)
Recent parethesias over feet, most prominent at night. Symptoms most c/w neuropathy. Exam normal.Will check CBC, CMP, B12 with labs.

## 2016-02-02 NOTE — Patient Instructions (Signed)
Labs today.  Follow up in 07/2015

## 2016-02-02 NOTE — Assessment & Plan Note (Signed)
BP Readings from Last 3 Encounters:  02/02/16 133/71  12/26/15 102/50  08/05/15 144/70   BP well controlled. Renal function with labs. Continue current medication.

## 2016-02-06 DIAGNOSIS — H43811 Vitreous degeneration, right eye: Secondary | ICD-10-CM | POA: Diagnosis not present

## 2016-03-31 DIAGNOSIS — L570 Actinic keratosis: Secondary | ICD-10-CM | POA: Diagnosis not present

## 2016-03-31 DIAGNOSIS — Z1283 Encounter for screening for malignant neoplasm of skin: Secondary | ICD-10-CM | POA: Diagnosis not present

## 2016-03-31 DIAGNOSIS — Z85828 Personal history of other malignant neoplasm of skin: Secondary | ICD-10-CM | POA: Diagnosis not present

## 2016-03-31 DIAGNOSIS — Z08 Encounter for follow-up examination after completed treatment for malignant neoplasm: Secondary | ICD-10-CM | POA: Diagnosis not present

## 2016-05-12 ENCOUNTER — Other Ambulatory Visit: Payer: Self-pay | Admitting: Internal Medicine

## 2016-05-12 ENCOUNTER — Telehealth: Payer: Self-pay | Admitting: *Deleted

## 2016-05-12 DIAGNOSIS — Z1231 Encounter for screening mammogram for malignant neoplasm of breast: Secondary | ICD-10-CM

## 2016-05-12 NOTE — Telephone Encounter (Signed)
Patient had a double knee replacement, she will need a antibiotic before her dental appt on 05/21/16 Pharmacy [facility] in [place]

## 2016-05-12 NOTE — Telephone Encounter (Signed)
Please advise thanks.

## 2016-05-13 MED ORDER — AMOXICILLIN 500 MG PO CAPS: 2000.0000 mg | ORAL_CAPSULE | Freq: Once | ORAL | Status: DC

## 2016-05-13 NOTE — Telephone Encounter (Signed)
Amoxicillin sent to pharmacy. Appears this is what she has taken previously for dental procedures.

## 2016-05-13 NOTE — Telephone Encounter (Signed)
Spoke to patient. Advised of Dr. Ellen Henri previous note. Patient was grateful.

## 2016-05-27 ENCOUNTER — Other Ambulatory Visit: Payer: Self-pay | Admitting: Internal Medicine

## 2016-06-07 DIAGNOSIS — B078 Other viral warts: Secondary | ICD-10-CM | POA: Diagnosis not present

## 2016-06-09 ENCOUNTER — Ambulatory Visit
Admission: RE | Admit: 2016-06-09 | Discharge: 2016-06-09 | Disposition: A | Discharge status: Home / Self Care | Payer: Medicare Other | Source: Ambulatory Visit | Attending: Internal Medicine | Admitting: Internal Medicine

## 2016-06-09 ENCOUNTER — Other Ambulatory Visit: Payer: Self-pay | Admitting: Internal Medicine

## 2016-06-09 DIAGNOSIS — Z1231 Encounter for screening mammogram for malignant neoplasm of breast: Secondary | ICD-10-CM | POA: Diagnosis not present

## 2016-08-04 ENCOUNTER — Encounter: Payer: Medicare Other | Admitting: Internal Medicine

## 2016-08-05 ENCOUNTER — Encounter: Payer: Self-pay | Admitting: Family Medicine

## 2016-08-05 ENCOUNTER — Ambulatory Visit (INDEPENDENT_AMBULATORY_CARE_PROVIDER_SITE_OTHER): Payer: Medicare Other | Admitting: Family Medicine

## 2016-08-05 VITALS — BP 118/84 | HR 55 | Temp 97.9°F | Ht 61.5 in | Wt 145.0 lb

## 2016-08-05 DIAGNOSIS — F419 Anxiety disorder, unspecified: Secondary | ICD-10-CM | POA: Insufficient documentation

## 2016-08-05 DIAGNOSIS — Z0000 Encounter for general adult medical examination without abnormal findings: Secondary | ICD-10-CM | POA: Diagnosis not present

## 2016-08-05 DIAGNOSIS — Z0001 Encounter for general adult medical examination with abnormal findings: Secondary | ICD-10-CM

## 2016-08-05 DIAGNOSIS — R6889 Other general symptoms and signs: Secondary | ICD-10-CM | POA: Diagnosis not present

## 2016-08-05 DIAGNOSIS — Z23 Encounter for immunization: Secondary | ICD-10-CM | POA: Diagnosis not present

## 2016-08-05 LAB — COMPREHENSIVE METABOLIC PANEL
ALT: 20 U/L (ref 0–35)
AST: 20 U/L (ref 0–37)
Albumin: 3.8 g/dL (ref 3.5–5.2)
Alkaline Phosphatase: 51 U/L (ref 39–117)
BUN: 35 mg/dL — ABNORMAL HIGH (ref 6–23)
CO2: 32 mEq/L (ref 19–32)
Calcium: 9.7 mg/dL (ref 8.4–10.5)
Chloride: 102 mEq/L (ref 96–112)
Creatinine, Ser: 0.88 mg/dL (ref 0.40–1.20)
GFR: 66.09 mL/min (ref >60.00)
Glucose, Bld: 90 mg/dL (ref 70–99)
Potassium: 4.4 mEq/L (ref 3.5–5.1)
Sodium: 140 mEq/L (ref 135–145)
Total Bilirubin: 0.6 mg/dL (ref 0.2–1.2)
Total Protein: 7.3 g/dL (ref 6.0–8.3)

## 2016-08-05 LAB — CBC
HCT: 39.9 % (ref 36.0–46.0)
Hemoglobin: 13.5 g/dL (ref 12.0–15.0)
MCHC: 33.8 g/dL (ref 30.0–36.0)
MCV: 95.1 fl (ref 78.0–100.0)
Platelets: 228 10*3/uL (ref 150.0–400.0)
RBC: 4.2 Mil/uL (ref 3.87–5.11)
RDW: 13.3 % (ref 11.5–15.5)
WBC: 5.9 10*3/uL (ref 4.0–10.5)

## 2016-08-05 LAB — HEMOGLOBIN A1C: Hgb A1c MFr Bld: 5.4 % (ref 4.6–6.5)

## 2016-08-05 MED ORDER — ALPRAZOLAM 0.25 MG PO TABS: 0.2500 mg | ORAL_TABLET | Freq: Three times a day (TID) | ORAL | 0 refills | Status: DC | PRN

## 2016-08-05 NOTE — Patient Instructions (Signed)
Nice to meet you. Please continue to work on diet and exercise. We will give you a short course of Xanax to help with your anxiety. Please monitor your feet and if they have worsening numbness please let us know. If you develop any cuts that are not healing on your feet please let us know as well. We'll get some lab work and call you with the results.

## 2016-08-05 NOTE — Assessment & Plan Note (Signed)
Overall doing well. Weight is trending in the right direction. Blood pressure well controlled. Vaccinations up-to-date. Pelvic exam deferred given status post hysterectomy. Recent mammogram and colonoscopy is reviewed. Lab work as outlined below. Flu shot given today.

## 2016-08-05 NOTE — Progress Notes (Signed)
Tommi Rumps, MD Phone: [phone]  Morgan Byrd is a 80 y.o. female who presents today for physical exam.  Anxiety: Patient notes a fair amount of anxiety recently as her son has been diagnosed with vasculitis and is currently on dialysis. She has been taking Xanax intermittently for this recently. No drowsiness with this. No depression.  Neuropathy: Patient with intermittent numbness and burning sensation in her bilateral toes. Has been going on for a long time. Mostly only happens at night. No numbness or weakness elsewhere. B12 previously normal.  Diet: She is doing Weight Watchers. Weight is down. Exercises by walking daily. Status post hysterectomy for noncancerous reason thus no need for Pap smear. Mammogram a couple months ago was BI-RADS 1. Colonoscopy earlier this year with no polyps and no recommended follow up per GI report. Up to date on tetanus vaccination, pneumonia vaccination, and Zostavax. Flu shot will be given today. Former smoker. Quit in 1974. No alcohol use. No illicit drug use. Next menses an ophthalmologist once a year. Sees a dentist twice a year.  Active Ambulatory Problems    Diagnosis Date Noted  . Hypertension 04/25/2012  . Low back pain 04/25/2012  . Osteoporosis 04/25/2012  . Hyperlipidemia 04/25/2012  . Screening for breast cancer 04/25/2012  . Raynaud phenomenon 04/25/2012  . Medicare annual wellness visit, subsequent 04/30/2013  . Obesity (BMI 30-39.9) 05/03/2014  . Neuropathy ([place]) 02/02/2016  . Encounter for general adult medical examination with abnormal findings 08/05/2016  . Anxiety 08/05/2016   Resolved Ambulatory Problems    Diagnosis Date Noted  . Anemia 04/25/2012  . Dysphagia 10/09/2012  . Bilateral otitis media 10/27/2013  . Routine general medical examination at a health care facility 08/05/2015   Past Medical History:  Diagnosis Date  . Anxiety   . Hypertension     Family History  Problem Relation Age of Onset  .  Dementia Mother   . Arthritis Mother   . Heart disease Father   . Hypertension Father   . Cancer Father     prostate  . Diabetes Father   . Diabetes Sister   . Cancer Sister     breast  . Breast cancer Sister 8  . Vasculitis Son   . Kidney disease Son   . Breast cancer Cousin 62    Social History   Social History  . Marital status: Married    Spouse name: N/A  . Number of children: N/A  . Years of education: N/A   Occupational History  . Not on file.   Social History Main Topics  . Smoking status: Never Smoker  . Smokeless tobacco: Not on file  . Alcohol use No  . Drug use: Unknown  . Sexual activity: Not on file   Other Topics Concern  . Not on file   Social History Narrative   Lives in [place] with husband. Son and daughter live nearby.      Work - retired Network engineer      Diet - healthy, regular   Exercise - housework    ROS  General:  Negative for nexplained weight loss, fever Skin: Negative for new or changing mole, sore that won't heal HEENT: Positive for ringing in ears, Negative for trouble hearing, trouble seeing, mouth sores, hoarseness, change in voice, dysphagia. CV:  Negative for chest pain, dyspnea, edema, palpitations Resp: Negative for cough, dyspnea, hemoptysis GI: Negative for nausea, vomiting, diarrhea, constipation, abdominal pain, melena, hematochezia. GU: Negative for dysuria, incontinence, urinary hesitance, hematuria, vaginal or  penile discharge, polyuria, sexual difficulty, lumps in testicle or breasts MSK: Negative for muscle cramps or aches, joint pain or swelling Neuro:  Positive for numbness,Negative for headaches, weakness, dizziness, passing out/fainting Psych:  Positive for anxiety,Negative for depression, memory problems  Objective  Physical Exam Vitals:   08/05/16 0926  BP: 118/84  Pulse: (!) 55  Temp: 97.9 F (36.6 C)    BP Readings from Last 3 Encounters:  08/05/16 118/84  02/02/16 133/71  12/26/15 (!) 102/50     Wt Readings from Last 3 Encounters:  08/05/16 145 lb (65.8 kg)  02/02/16 158 lb 4 oz (71.8 kg)  12/26/15 160 lb (72.6 kg)    Physical Exam  Constitutional: She is well-developed, well-nourished, and in no distress.  HENT:  Head: Normocephalic and atraumatic.  Mouth/Throat: Oropharynx is clear and moist. No oropharyngeal exudate.  Eyes: Conjunctivae are normal. Pupils are equal, round, and reactive to light.  Cardiovascular: Normal rate, regular rhythm and normal heart sounds.   2+ DP and PT pulses bilaterally  Pulmonary/Chest: Effort normal.  Abdominal: Soft. Bowel sounds are normal. She exhibits no distension. There is no tenderness. There is no rebound and no guarding.  Musculoskeletal: She exhibits no edema.  Neurological: She is alert. Gait normal.  Sensation to light touch intact bilateral feet  Skin: Skin is warm and dry. She is not diaphoretic.  Psychiatric:  Mood anxious, affect anxious and intermittently tearful     Assessment/Plan:   Anxiety Related to her son's recent health issues. Has been taking Xanax intermittently for this. This is of good benefit. We will refill this. I offered counselor referral though she deferred at this time.  Neuropathy ([place]) Symptoms most consistent with neuropathy. Benign exam today. B12 already checked. We'll check an A1c today.  Encounter for general adult medical examination with abnormal findings Overall doing well. Weight is trending in the right direction. Blood pressure well controlled. Vaccinations up-to-date. Pelvic exam deferred given status post hysterectomy. Recent mammogram and colonoscopy is reviewed. Lab work as outlined below. Flu shot given today.   Orders Placed This Encounter  Procedures  . Flu vaccine HIGH DOSE PF  . Comp Met (CMET)  . HgB A1c  . CBC    Meds ordered this encounter  Medications  . ALPRAZolam (XANAX) 0.25 MG tablet    Sig: Take 1 tablet (0.25 mg total) by mouth 3 (three) times daily as  needed for anxiety.    Dispense:  20 tablet    Refill:  0     Tommi Rumps, MD [facility]

## 2016-08-05 NOTE — Assessment & Plan Note (Signed)
Related to her son's recent health issues. Has been taking Xanax intermittently for this. This is of good benefit. We will refill this. I offered counselor referral though she deferred at this time.

## 2016-08-05 NOTE — Assessment & Plan Note (Signed)
Symptoms most consistent with neuropathy. Benign exam today. B12 already checked. We'll check an A1c today.

## 2016-09-21 ENCOUNTER — Other Ambulatory Visit: Payer: Self-pay | Admitting: Family Medicine

## 2016-09-21 DIAGNOSIS — M545 Low back pain, unspecified: Secondary | ICD-10-CM

## 2016-09-21 MED ORDER — METOPROLOL TARTRATE 50 MG PO TABS: 50.0000 mg | ORAL_TABLET | Freq: Two times a day (BID) | ORAL | 3 refills | Status: DC

## 2016-09-21 MED ORDER — LOSARTAN POTASSIUM 100 MG PO TABS: 100.0000 mg | ORAL_TABLET | Freq: Every day | ORAL | 3 refills | Status: DC

## 2016-09-21 MED ORDER — MELOXICAM 7.5 MG PO TABS: 7.5000 mg | ORAL_TABLET | Freq: Every day | ORAL | 3 refills | Status: DC

## 2016-09-21 NOTE — Telephone Encounter (Addendum)
Metoprolol Refilled 07/18/15. Losartan refilled 07/18/15. meloxicam refilled 07/22/16.   Pt last seen 08/05/16. No information in that visit which states that pt should stop or continue this medication. Pt scheduled for 11/03/16. Please advise?

## 2016-09-21 NOTE — Telephone Encounter (Signed)
Sent to pharmacy 

## 2016-11-03 ENCOUNTER — Encounter: Payer: Self-pay | Admitting: Family Medicine

## 2016-11-03 ENCOUNTER — Ambulatory Visit (INDEPENDENT_AMBULATORY_CARE_PROVIDER_SITE_OTHER): Payer: Medicare Other | Admitting: Family Medicine

## 2016-11-03 VITALS — BP 138/70 | HR 54 | Temp 98.2°F | Wt 148.2 lb

## 2016-11-03 DIAGNOSIS — F419 Anxiety disorder, unspecified: Secondary | ICD-10-CM | POA: Diagnosis not present

## 2016-11-03 DIAGNOSIS — M810 Age-related osteoporosis without current pathological fracture: Secondary | ICD-10-CM | POA: Diagnosis not present

## 2016-11-03 DIAGNOSIS — I10 Essential (primary) hypertension: Secondary | ICD-10-CM | POA: Diagnosis not present

## 2016-11-03 NOTE — Patient Instructions (Signed)
Nice to see you. Please continue your blood pressure medications. We will get a bone density test scheduled for [place] call you with the results when they return.

## 2016-11-03 NOTE — Progress Notes (Signed)
  Tommi Rumps, MD Phone: [phone]  Morgan Byrd is a 80 y.o. female who presents today for f/u.  HYPERTENSION  Disease Monitoring  Home BP Monitoring 120/75 Chest pain- no    Dyspnea- no Medications  Compliance-  Taking HCTZ, losartan, metoprolol.  Edema- no  Anxiety is well controlled. Notes she did not even require a Xanax over the holidays. No depression. Notes her son is doing quite a bit better with regard to his vasculitis and dialysis. He is going to start peritoneal dialysis. Notes she gets most anxious when there are a lot of people around.  Osteoporosis: Currently taking Actonel. Has been taking this 4-5 years. No reflux issues with it. She tolerates this well. Last DEXA scan was in 2015.   PMH: nonsmoker.   ROS see history of present illness  Objective  Physical Exam Vitals:   11/03/16 0955  BP: 138/70  Pulse: (!) 54  Temp: 98.2 F (36.8 C)    BP Readings from Last 3 Encounters:  11/03/16 138/70  08/05/16 118/84  02/02/16 133/71   Wt Readings from Last 3 Encounters:  11/03/16 148 lb 3.2 oz (67.2 kg)  08/05/16 145 lb (65.8 kg)  02/02/16 158 lb 4 oz (71.8 kg)    Physical Exam  Constitutional: No distress.  HENT:  Head: Normocephalic and atraumatic.  Cardiovascular: Normal rate, regular rhythm and normal heart sounds.   Pulmonary/Chest: Effort normal and breath sounds normal.  Abdominal: Soft. Bowel sounds are normal. She exhibits no distension. There is no tenderness.  Musculoskeletal: She exhibits no edema.  Skin: Skin is warm and dry. She is not diaphoretic.     Assessment/Plan: Please see individual problem list.  Hypertension At goal. Continue current medications.  Osteoporosis Patient is due for repeat DEXA scan. This will be ordered. Continue Actonel. Discussed potential holiday from this medication depending on DEXA scan results.  Anxiety Currently well-controlled. Continue as needed Xanax.   Orders Placed This Encounter    Procedures  . DG Bone Density    Standing Status:   Future    Standing Expiration Date:   01/04/2018    Order Specific Question:   Reason for Exam (SYMPTOM  OR DIAGNOSIS REQUIRED)    Answer:   osteoporosis follow-up    Order Specific Question:   Preferred imaging location?    Answer:   [facility]     Tommi Rumps, MD [place]

## 2016-11-03 NOTE — Assessment & Plan Note (Signed)
Currently well-controlled. Continue as needed Xanax.

## 2016-11-03 NOTE — Assessment & Plan Note (Signed)
At goal. Continue current medications. 

## 2016-11-03 NOTE — Assessment & Plan Note (Addendum)
Patient is due for repeat DEXA scan. This will be ordered. Continue Actonel. Discussed potential holiday from this medication depending on DEXA scan results.

## 2016-11-03 NOTE — Progress Notes (Signed)
Pre visit review using our clinic review tool, if applicable. No additional management support is needed unless otherwise documented below in the visit note. 

## 2016-11-19 ENCOUNTER — Telehealth: Payer: Self-pay | Admitting: Family Medicine

## 2016-11-19 NOTE — Telephone Encounter (Signed)
Pt called and stated that she is having some roputine dental cleaning done on Tuesday 1/16 and needs to get amoxicillin. Please advise, thank you!  [place], [place]  Call pt @ [phone]

## 2016-11-19 NOTE — Telephone Encounter (Signed)
Please advise 

## 2016-11-22 MED ORDER — AMOXICILLIN 500 MG PO CAPS: 2000.0000 mg | ORAL_CAPSULE | Freq: Once | ORAL | 0 refills | Status: AC

## 2016-11-22 NOTE — Telephone Encounter (Signed)
Spoke with patient and let her know that I was sending this to her pharmacy.

## 2016-11-23 ENCOUNTER — Encounter: Payer: Self-pay | Admitting: Family Medicine

## 2016-12-27 ENCOUNTER — Ambulatory Visit
Admission: RE | Admit: 2016-12-27 | Discharge: 2016-12-27 | Disposition: A | Discharge status: Home / Self Care | Payer: Medicare Other | Source: Ambulatory Visit | Attending: Family Medicine | Admitting: Family Medicine

## 2016-12-27 DIAGNOSIS — M419 Scoliosis, unspecified: Secondary | ICD-10-CM | POA: Diagnosis not present

## 2016-12-27 DIAGNOSIS — M810 Age-related osteoporosis without current pathological fracture: Secondary | ICD-10-CM | POA: Diagnosis not present

## 2017-01-10 ENCOUNTER — Other Ambulatory Visit: Payer: Self-pay

## 2017-01-10 NOTE — Telephone Encounter (Signed)
Last OV 11/03/16 last filled by Dr.Walker 01/26/16 [id number]

## 2017-01-11 NOTE — Telephone Encounter (Signed)
Left message to return call 

## 2017-01-11 NOTE — Telephone Encounter (Signed)
Please determine what pharmacy she would like this sent to. She also needs a lab appointment this month.

## 2017-01-12 MED ORDER — HYDROCHLOROTHIAZIDE 12.5 MG PO CAPS: 12.5000 mg | ORAL_CAPSULE | Freq: Every day | ORAL | 3 refills | Status: DC

## 2017-01-12 NOTE — Telephone Encounter (Signed)
[facility] [place]

## 2017-01-31 DIAGNOSIS — H43811 Vitreous degeneration, right eye: Secondary | ICD-10-CM | POA: Diagnosis not present

## 2017-02-02 ENCOUNTER — Telehealth: Payer: Self-pay | Admitting: Family Medicine

## 2017-02-02 NOTE — Telephone Encounter (Signed)
Left pt message asking to call Allison back directly at [phone] to schedule AWV. Thanks! °

## 2017-02-07 NOTE — Telephone Encounter (Signed)
Scheduled 02/10/17

## 2017-02-10 ENCOUNTER — Ambulatory Visit (INDEPENDENT_AMBULATORY_CARE_PROVIDER_SITE_OTHER): Payer: Medicare Other

## 2017-02-10 VITALS — BP 124/68 | HR 58 | Temp 97.6°F | Resp 12 | Ht 61.5 in | Wt 148.4 lb

## 2017-02-10 DIAGNOSIS — Z0000 Encounter for general adult medical examination without abnormal findings: Secondary | ICD-10-CM | POA: Diagnosis not present

## 2017-02-10 NOTE — Progress Notes (Signed)
Subjective:   Morgan Byrd is a 81 y.o. female who presents for Medicare Annual (Subsequent) preventive examination.  Review of Systems:  No ROS.  Medicare Wellness Visit. Cardiac Risk Factors include: advanced age (>53men, >14 women);hypertension     Objective:     Vitals: BP 124/68 (BP Location: Left Arm, Patient Position: Sitting, Cuff Size: Normal)   Pulse (!) 58   Temp 97.6 F (36.4 C) (Oral)   Resp 12   Ht 5' 1.5" (1.562 m)   Wt 148 lb 6.4 oz (67.3 kg)   SpO2 98%   BMI 27.59 kg/m   Body mass index is 27.59 kg/m.   Tobacco History  Smoking Status  . Never Smoker  Smokeless Tobacco  . Not on file     Counseling given: No   Past Medical History:  Diagnosis Date  . Anxiety   . Hypertension    Past Surgical History:  Procedure Laterality Date  . ABDOMINAL HYSTERECTOMY    . BLADDER SUSPENSION    . BREAST BIOPSY Right 1978   neg  . COLONOSCOPY WITH PROPOFOL N/A 12/26/2015   Procedure: COLONOSCOPY WITH PROPOFOL;  Surgeon: Manya Silvas, MD;  Location: [facility] ENDOSCOPY;  Service: Endoscopy;  Laterality: N/A;  . REPLACEMENT TOTAL KNEE BILATERAL    . SHOULDER SURGERY    . VAGINAL DELIVERY     2   Family History  Problem Relation Age of Onset  . Dementia Mother   . Arthritis Mother   . Heart disease Father   . Hypertension Father   . Cancer Father     prostate  . Diabetes Father   . Diabetes Sister   . Cancer Sister     breast  . Breast cancer Sister 54  . Vasculitis Son   . Kidney disease Son   . Breast cancer Cousin 62   History  Sexual Activity  . Sexual activity: No    Outpatient Encounter Prescriptions as of 02/10/2017  Medication Sig  . ALPRAZolam (XANAX) 0.25 MG tablet Take 1 tablet (0.25 mg total) by mouth 3 (three) times daily as needed for anxiety.  Marland Kitchen aspirin 81 MG EC tablet Take 81 mg by mouth daily. Swallow whole.  . B Complex-C (SUPER B COMPLEX PO) Take by mouth.  Marland Kitchen CALCIUM-MAGNESIUM-ZINC PO Take by mouth.  . cholecalciferol  (VITAMIN D) 1000 UNITS tablet Take 1,000 Units by mouth daily.  . hydrochlorothiazide (MICROZIDE) 12.5 MG capsule Take 1 capsule (12.5 mg total) by mouth daily.  Marland Kitchen losartan (COZAAR) 100 MG tablet Take 1 tablet (100 mg total) by mouth daily.  . meloxicam (MOBIC) 7.5 MG tablet Take 1 tablet (7.5 mg total) by mouth daily.  . metoprolol (LOPRESSOR) 50 MG tablet Take 1 tablet (50 mg total) by mouth 2 (two) times daily.  . Multiple Vitamin (MULTIVITAMIN) capsule Take 1 capsule by mouth daily.  . risedronate (ACTONEL) 150 MG tablet take 1 tablet by mouth every month on empty stomach with water  - DO NOT EAT, DRINK OR LIE DOWN FOR 30 MINUTES AFTER TAKING   No facility-administered encounter medications on file as of 02/10/2017.     Activities of Daily Living In your present state of health, do you have any difficulty performing the following activities: 02/10/2017  Hearing? N  Vision? N  Difficulty concentrating or making decisions? N  Walking or climbing stairs? N  Dressing or bathing? N  Doing errands, shopping? N  Preparing Food and eating ? N  Using the Toilet? N  In the past six months, have you accidently leaked urine? Y  Do you have problems with loss of bowel control? N  Managing your Medications? N  Managing your Finances? N  Housekeeping or managing your Housekeeping? N  Some recent data might be hidden    Patient Care Team: Leone Haven, MD as PCP - General (Family Medicine)    Assessment:    This is a routine wellness examination for Morgan Byrd. The goal of the wellness visit is to assist the patient how to close the gaps in care and create a preventative care plan for the patient.   She is to start taking calcium/VIT D as appropriate/Osteoporosis reviewed.  Medications reviewed; taking without issues or barriers.  Safety issues reviewed; smoke detectors in the home. No firearms in the home.  Wears seatbelts when driving or riding with others. Patient does wear  sunscreen or protective clothing when in direct sunlight. No violence in the home.  Patient is alert, normal appearance, oriented to person/place/and time. Correctly identified the president of the [place], recall of 3/3 words, and performing simple calculations.  Patient displays appropriate judgement and can read correct time from watch face.  No new identified risk were noted.  No failures at ADL's or IADL's.   BMI- discussed the importance of a healthy diet, water intake and exercise. Educational material provided.   HTN- followed by PCP.  Dental- every six months.  [facility].  Eye- Visual acuity not assessed per patient preference since they have regular follow up with the ophthalmologist.  Wears corrective lenses.  Sleep patterns- Sleeps 7-8 hours at night.  Wakes feeling rested.   Health maintenance gaps- closed.  Patient Concerns: None at this time. Follow up with PCP as needed.  Exercise Activities and Dietary recommendations Current Exercise Habits: Home exercise routine, Type of exercise: walking, Time (Minutes): 25, Frequency (Times/Week): 5, Weekly Exercise (Minutes/Week): 125, Intensity: Mild  Goals    . Healthy Lifestyle          Stay active Stay hydrated Low carb foods      Fall Risk Fall Risk  02/10/2017 11/03/2016 08/05/2015 05/03/2014 04/30/2013  Falls in the past year? No No No No Yes  Number falls in past yr: - - - - 2 or more  Injury with Fall? - - - - -   Depression Screen PHQ 2/9 Scores 02/10/2017 08/05/2015 05/03/2014 04/30/2013  PHQ - 2 Score 0 0 0 0     Cognitive Function MMSE - Mini Mental State Exam 02/10/2017  Orientation to time 5  Orientation to Place 5  Registration 3  Attention/ Calculation 5  Recall 3  Language- name 2 objects 2  Language- repeat 1  Language- follow 3 step command 3  Language- read & follow direction 1  Write a sentence 1  Copy design 1  Total score 30        Immunization History  Administered Date(s) Administered  .  Influenza, High Dose Seasonal PF 08/05/2016  . Influenza,inj,Quad PF,36+ Mos 08/24/2014, 08/05/2015  . Influenza-Unspecified 08/08/2012, 08/11/2013  . Pneumococcal Conjugate-13 05/03/2014  . Pneumococcal Polysaccharide-23 04/25/2005  . Tdap 04/25/2012  . Zoster 06/26/2011   Screening Tests Health Maintenance  Topic Date Due  . INFLUENZA VACCINE  06/08/2017  . TETANUS/TDAP  04/25/2022  . DEXA SCAN  Completed  . PNA vac Low Risk Adult  Completed      Plan:    End of life planning; Advance aging; Advanced directives discussed. Copy of current HCPOA/Living Will  requested.    Medicare Attestation I have personally reviewed: The patient's medical and social history Their use of alcohol, tobacco or illicit drugs Their current medications and supplements The patient's functional ability including ADLs,fall risks, home safety risks, cognitive, and hearing and visual impairment Diet and physical activities Evidence for depression   The patient's weight, height, BMI, and visual acuity have been recorded in the chart.  I have made referrals and provided education to the patient based on review of the above and I have provided the patient with a written personalized care plan for preventive services.    During the course of the visit the patient was educated and counseled about the following appropriate screening and preventive services:   Vaccines to include Pneumoccal, Influenza, Hepatitis B, Td, Zostavax, HCV  Colorectal cancer screening-UTD  Bone density screening-UTD  Glaucoma screening-annual eye exam  Mammography-UTD  Nutrition counseling   Patient Instructions (the written plan) was given to the patient.   Varney Biles, LPN  6/0/1093

## 2017-02-10 NOTE — Patient Instructions (Addendum)
  Morgan Byrd , Thank you for taking time to come for your Medicare Wellness Visit. I appreciate your ongoing commitment to your health goals. Please review the following plan we discussed and let me know if I can assist you in the future.   Follow up with Dr. Caryl Bis as needed.    Bring a copy of your [place] and/or Living Will to be scanned into chart.  Have a great day!  These are the goals we discussed: Goals    . Healthy Lifestyle          Stay active Stay hydrated Low carb foods       This is a list of the screening recommended for you and due dates:  Health Maintenance  Topic Date Due  . Flu Shot  06/08/2017  . Tetanus Vaccine  04/25/2022  . DEXA scan (bone density measurement)  Completed  . Pneumonia vaccines  Completed

## 2017-02-24 NOTE — Progress Notes (Signed)
I have reviewed the above note and agree.  Dodie Parisi, M.D.  

## 2017-03-07 DIAGNOSIS — Z85828 Personal history of other malignant neoplasm of skin: Secondary | ICD-10-CM | POA: Diagnosis not present

## 2017-03-07 DIAGNOSIS — L570 Actinic keratosis: Secondary | ICD-10-CM | POA: Diagnosis not present

## 2017-03-31 DIAGNOSIS — Z23 Encounter for immunization: Secondary | ICD-10-CM | POA: Diagnosis not present

## 2017-05-04 ENCOUNTER — Encounter: Payer: Self-pay | Admitting: Family Medicine

## 2017-05-04 ENCOUNTER — Ambulatory Visit (INDEPENDENT_AMBULATORY_CARE_PROVIDER_SITE_OTHER): Payer: Medicare Other | Admitting: Family Medicine

## 2017-05-04 VITALS — BP 140/80 | HR 58 | Temp 98.5°F | Wt 153.8 lb

## 2017-05-04 DIAGNOSIS — I10 Essential (primary) hypertension: Secondary | ICD-10-CM

## 2017-05-04 DIAGNOSIS — G629 Polyneuropathy, unspecified: Secondary | ICD-10-CM | POA: Diagnosis not present

## 2017-05-04 DIAGNOSIS — F419 Anxiety disorder, unspecified: Secondary | ICD-10-CM

## 2017-05-04 LAB — COMPREHENSIVE METABOLIC PANEL
ALT: 20 U/L (ref 0–35)
AST: 22 U/L (ref 0–37)
Albumin: 4 g/dL (ref 3.5–5.2)
Alkaline Phosphatase: 51 U/L (ref 39–117)
BUN: 23 mg/dL (ref 6–23)
CO2: 31 mEq/L (ref 19–32)
Calcium: 9.7 mg/dL (ref 8.4–10.5)
Chloride: 104 mEq/L (ref 96–112)
Creatinine, Ser: 0.82 mg/dL (ref 0.40–1.20)
GFR: 71.56 mL/min (ref >60.00)
Glucose, Bld: 95 mg/dL (ref 70–99)
Potassium: 4.5 mEq/L (ref 3.5–5.1)
Sodium: 140 mEq/L (ref 135–145)
Total Bilirubin: 0.6 mg/dL (ref 0.2–1.2)
Total Protein: 6.9 g/dL (ref 6.0–8.3)

## 2017-05-04 NOTE — Progress Notes (Signed)
  Tommi Rumps, MD Phone: [phone]  Morgan Byrd is a 81 y.o. female who presents today for f/u.  HYPERTENSION  Disease Monitoring  Home BP Monitoring 120/60s Chest pain- no    Dyspnea- no Medications  Compliance-  Taking losartan, metoprolol, HCTZ, did not take it yet today. Lightheadedness-  no  Edema- no  Neuropathy: Patient notes occasional burning sensation over her midfoot. Does not occur frequently. No numbness. No hand involvement. Does know she had testing for her blood flow in her legs and reports this was good.  Anxiety: Doesn't have issues with this very often. She'll take a Xanax infrequently and this will be beneficial. No depression.  PMH: nonsmoker.   ROS see history of present illness  Objective  Physical Exam Vitals:   05/04/17 1015  BP: 140/80  Pulse: (!) 58  Temp: 98.5 F (36.9 C)    BP Readings from Last 3 Encounters:  05/04/17 140/80  02/10/17 124/68  11/03/16 138/70   Wt Readings from Last 3 Encounters:  05/04/17 153 lb 12.8 oz (69.8 kg)  02/10/17 148 lb 6.4 oz (67.3 kg)  11/03/16 148 lb 3.2 oz (67.2 kg)    Physical Exam  Constitutional: No distress.  Cardiovascular: Normal rate, regular rhythm and normal heart sounds.   Pulmonary/Chest: Effort normal and breath sounds normal.  Musculoskeletal: She exhibits no edema.  Bilateral feet with no tenderness, sensation to light touch intact in bilateral feet and hands  Neurological: She is alert. Gait normal.  Skin: She is not diaphoretic.     Assessment/Plan: Please see individual problem list.  Hypertension Well-controlled at home. Continue current medications. Check CMP today.  Neuropathy (HCC) Suspect mild neuropathy. Benign exam today. Discussed monitoring to see if it worsens and could consider medication or neurology evaluation.  Anxiety Stable. Continue as needed Xanax.   Orders Placed This Encounter  Procedures  . Comp Met (CMET)    Tommi Rumps, MD [place]

## 2017-05-04 NOTE — Assessment & Plan Note (Signed)
Suspect mild neuropathy. Benign exam today. Discussed monitoring to see if it worsens and could consider medication or neurology evaluation.

## 2017-05-04 NOTE — Assessment & Plan Note (Signed)
Stable. Continue as needed Xanax. 

## 2017-05-04 NOTE — Patient Instructions (Signed)
Nice to see you. Please monitor your feet. If your neuropathy gets worse please let us know. Please check to see if you need any refills and let us know. We will check lab work today and contact you with results.

## 2017-05-04 NOTE — Assessment & Plan Note (Signed)
Well-controlled at home. Continue current medications. Check CMP today.

## 2017-05-12 ENCOUNTER — Telehealth: Payer: Self-pay | Admitting: Family Medicine

## 2017-05-12 MED ORDER — AMOXICILLIN 500 MG PO CAPS: 2000.0000 mg | ORAL_CAPSULE | Freq: Once | ORAL | 0 refills | Status: AC

## 2017-05-12 NOTE — Telephone Encounter (Signed)
Pt called and stated that she has a dental appt tomorrow morning and having a cleaning. Was advised by orthopedic surgeon to get an antibiotic every time she goes. Please advise, thank you!  [place], [place]  Call pt @ [phone]

## 2017-05-12 NOTE — Telephone Encounter (Signed)
Please advise 

## 2017-05-12 NOTE — Telephone Encounter (Signed)
Patient notified

## 2017-05-12 NOTE — Telephone Encounter (Signed)
Sent to pharmacy 

## 2017-07-08 ENCOUNTER — Other Ambulatory Visit: Payer: Self-pay

## 2017-07-08 NOTE — Telephone Encounter (Signed)
Okay to refill. Please find out what her pharmacy is and send it in for her. Thanks.

## 2017-07-08 NOTE — Telephone Encounter (Signed)
Previously filled by Dr Mikki Santee  No recent bone density

## 2017-07-12 ENCOUNTER — Other Ambulatory Visit: Payer: Self-pay | Admitting: Family Medicine

## 2017-07-12 ENCOUNTER — Other Ambulatory Visit: Payer: Self-pay

## 2017-07-12 DIAGNOSIS — Z1231 Encounter for screening mammogram for malignant neoplasm of breast: Secondary | ICD-10-CM

## 2017-07-12 MED ORDER — RISEDRONATE SODIUM 150 MG PO TABS: ORAL_TABLET | ORAL | 3 refills | Status: DC

## 2017-07-12 NOTE — Telephone Encounter (Signed)
Script sent  

## 2017-07-12 NOTE — Telephone Encounter (Signed)
Last filled 05/27/16 4 5rf by Dr.Walker last OV 05/04/17

## 2017-07-13 DIAGNOSIS — L570 Actinic keratosis: Secondary | ICD-10-CM | POA: Diagnosis not present

## 2017-07-13 MED ORDER — RISEDRONATE SODIUM 150 MG PO TABS: ORAL_TABLET | ORAL | 5 refills | Status: DC

## 2017-07-14 NOTE — Telephone Encounter (Signed)
Script faxed.

## 2017-07-25 ENCOUNTER — Ambulatory Visit
Admission: RE | Admit: 2017-07-25 | Discharge: 2017-07-25 | Disposition: A | Discharge status: Home / Self Care | Payer: Medicare Other | Source: Ambulatory Visit | Attending: Family Medicine | Admitting: Family Medicine

## 2017-07-25 ENCOUNTER — Telehealth: Payer: Self-pay | Admitting: Family Medicine

## 2017-07-25 DIAGNOSIS — Z1231 Encounter for screening mammogram for malignant neoplasm of breast: Secondary | ICD-10-CM | POA: Diagnosis not present

## 2017-07-25 NOTE — Telephone Encounter (Signed)
Changed pharmacy from [facility] to [facility] [address], [place].

## 2017-08-03 ENCOUNTER — Encounter: Payer: Self-pay | Admitting: Family

## 2017-08-03 ENCOUNTER — Ambulatory Visit (INDEPENDENT_AMBULATORY_CARE_PROVIDER_SITE_OTHER): Payer: Medicare Other | Admitting: Family

## 2017-08-03 VITALS — BP 126/78 | HR 60 | Temp 98.1°F | Ht 61.5 in | Wt 157.4 lb

## 2017-08-03 DIAGNOSIS — J309 Allergic rhinitis, unspecified: Secondary | ICD-10-CM

## 2017-08-03 NOTE — Progress Notes (Signed)
Pre visit review using our clinic review tool, if applicable. No additional management support is needed unless otherwise documented below in the visit note. 

## 2017-08-03 NOTE — Progress Notes (Signed)
Subjective:    Patient ID: Morgan Byrd, female    DOB: 1936-09-09, 81 y.o.   MRN: [id number]  CC: Morgan Byrd is a 81 y.o. female who presents today for an acute visit.    HPI: CC: sinus congestion x one week, unchanged.  Left side of face has pressure, little nasal congestion, cough ( husband told her she was at night, none during day). Left eye watering, itching. Has felt like left side of face 'was a little swollen.'  No facial numbness, fever, sore throat, .  Hasn't tried any medication for symptoms.   htn- compliant with medications Denies exertional chest pain or pressure, numbness or tingling radiating to left arm or jaw, palpitations, dizziness, frequent headaches, changes in vision, or shortness of breath.        HISTORY:  Past Medical History:  Diagnosis Date  . Anxiety   . Hypertension    Past Surgical History:  Procedure Laterality Date  . ABDOMINAL HYSTERECTOMY    . BLADDER SUSPENSION    . BREAST BIOPSY Right 1978   neg  . COLONOSCOPY WITH PROPOFOL N/A 12/26/2015   Procedure: COLONOSCOPY WITH PROPOFOL;  Surgeon: Manya Silvas, MD;  Location: [facility] ENDOSCOPY;  Service: Endoscopy;  Laterality: N/A;  . REPLACEMENT TOTAL KNEE BILATERAL    . SHOULDER SURGERY    . VAGINAL DELIVERY     2   Family History  Problem Relation Age of Onset  . Dementia Mother   . Arthritis Mother   . Heart disease Father   . Hypertension Father   . Cancer Father        prostate  . Diabetes Father   . Diabetes Sister   . Cancer Sister        breast  . Breast cancer Sister 13  . Vasculitis Son   . Kidney disease Son   . Breast cancer Cousin 62    Allergies: Codeine; Fosamax [alendronate sodium]; and Lexapro [escitalopram oxalate] Current Outpatient Prescriptions on File Prior to Visit  Medication Sig Dispense Refill  . ALPRAZolam (XANAX) 0.25 MG tablet Take 1 tablet (0.25 mg total) by mouth 3 (three) times daily as needed for anxiety. 20 tablet 0  . aspirin 81 MG EC  tablet Take 81 mg by mouth daily. Swallow whole.    . B Complex-C (SUPER B COMPLEX PO) Take by mouth.    Marland Kitchen CALCIUM-MAGNESIUM-ZINC PO Take by mouth.    . cholecalciferol (VITAMIN D) 1000 UNITS tablet Take 1,000 Units by mouth daily.    . hydrochlorothiazide (MICROZIDE) 12.5 MG capsule Take 1 capsule (12.5 mg total) by mouth daily. 90 capsule 3  . losartan (COZAAR) 100 MG tablet Take 1 tablet (100 mg total) by mouth daily. 90 tablet 3  . meloxicam (MOBIC) 7.5 MG tablet Take 1 tablet (7.5 mg total) by mouth daily. 90 tablet 3  . metoprolol (LOPRESSOR) 50 MG tablet Take 1 tablet (50 mg total) by mouth 2 (two) times daily. 180 tablet 3  . Multiple Vitamin (MULTIVITAMIN) capsule Take 1 capsule by mouth daily.    . risedronate (ACTONEL) 150 MG tablet take 1 tablet by mouth every month on empty stomach with water  - DO NOT EAT, DRINK OR LIE DOWN FOR 30 MINUTES AFTER TAKING 4 tablet 5  . risedronate (ACTONEL) 150 MG tablet take 1 tablet by mouth every month on empty stomach with water  - DO NOT EAT, DRINK OR LIE DOWN FOR 30 MINUTES AFTER TAKING 4 tablet 3  No current facility-administered medications on file prior to visit.     Social History  Substance Use Topics  . Smoking status: Never Smoker  . Smokeless tobacco: Never Used  . Alcohol use No    Review of Systems  Constitutional: Negative for chills and fever.  HENT: Positive for congestion and sinus pressure. Negative for ear pain.   Eyes: Positive for discharge and itching. Negative for visual disturbance.  Respiratory: Positive for cough. Negative for shortness of breath and wheezing.   Cardiovascular: Negative for chest pain and palpitations.  Gastrointestinal: Negative for nausea and vomiting.  Neurological: Negative for numbness and headaches.      Objective:    BP 126/78   Pulse 60   Temp 98.1 F (36.7 C) (Oral)   Ht 5' 1.5" (1.562 m)   Wt 157 lb 6.4 oz (71.4 kg)   SpO2 91%   BMI 29.26 kg/m    Physical Exam    Constitutional: She appears well-developed and well-nourished.  HENT:  Head: Normocephalic and atraumatic.  Right Ear: Hearing, tympanic membrane, external ear and ear canal normal. No drainage, swelling or tenderness. No foreign bodies. Tympanic membrane is not erythematous and not bulging. No middle ear effusion. No decreased hearing is noted.  Left Ear: Hearing, tympanic membrane, external ear and ear canal normal. No drainage, swelling or tenderness. No foreign bodies. Tympanic membrane is not erythematous and not bulging.  No middle ear effusion. No decreased hearing is noted.  Nose: Nose normal. No rhinorrhea. Right sinus exhibits no maxillary sinus tenderness and no frontal sinus tenderness. Left sinus exhibits no maxillary sinus tenderness and no frontal sinus tenderness.  Mouth/Throat: Uvula is midline, oropharynx is clear and moist and mucous membranes are normal. No oropharyngeal exudate, posterior oropharyngeal edema, posterior oropharyngeal erythema or tonsillar abscesses.  No facial swelling appreciated. No scalp tenderness.   Eyes: Conjunctivae are normal. Right eye exhibits no discharge. Left eye exhibits no discharge.  Cardiovascular: Regular rhythm, normal heart sounds and normal pulses.   Pulmonary/Chest: Effort normal and breath sounds normal. She has no wheezes. She has no rhonchi. She has no rales.  Lymphadenopathy:       Head (right side): No submental, no submandibular, no tonsillar, no preauricular, no posterior auricular and no occipital adenopathy present.       Head (left side): No submental, no submandibular, no tonsillar, no preauricular, no posterior auricular and no occipital adenopathy present.    She has no cervical adenopathy.  Neurological: She is alert.  Skin: Skin is warm and dry.  Psychiatric: She has a normal mood and affect. Her speech is normal and behavior is normal. Thought content normal.  Vitals reviewed.      Assessment & Plan:   1. Allergic  rhinitis, unspecified seasonality, unspecified trigger Afebrile. Patient is well-appearing. We jointly consistent with viral versus allergic etiology. We jointly agreed to start over-the-counter antihistamine and patient would let me know if not better.   I am having Ms. Gade maintain her aspirin, B Complex-C (SUPER B COMPLEX PO), multivitamin, cholecalciferol, CALCIUM-MAGNESIUM-ZINC PO, ALPRAZolam, metoprolol tartrate, losartan, meloxicam, hydrochlorothiazide, risedronate, and risedronate.   No orders of the defined types were placed in this encounter.   Return precautions given.   Risks, benefits, and alternatives of the medications and treatment plan prescribed today were discussed, and patient expressed understanding.   Education regarding symptom management and diagnosis given to patient on AVS.  Continue to follow with Leone Haven, MD for routine health maintenance.  Morgan Byrd and I agreed with plan.   Mable Paris, FNP

## 2017-08-03 NOTE — Patient Instructions (Signed)
Suspect allergies playing a role  As discussed,  Lets try over the counter zyrtec or claritin and let me know if symptoms improve.   Monitor blood pressure,  Goal is less than 130/80; if persistently higher, please make sooner follow up appointment so we can recheck you blood pressure and manage medications  If there is no improvement in your symptoms, or if there is any worsening of symptoms, or if you have any additional concerns, please return for re-evaluation; or, if we are closed, consider going to the Emergency Room for evaluation if symptoms urgent.    Allergic Rhinitis Allergic rhinitis is when the mucous membranes in the nose respond to allergens. Allergens are particles in the air that cause your body to have an allergic reaction. This causes you to release allergic antibodies. Through a chain of events, these eventually cause you to release histamine into the blood stream. Although meant to protect the body, it is this release of histamine that causes your discomfort, such as frequent sneezing, congestion, and an itchy, runny nose. What are the causes? Seasonal allergic rhinitis (hay fever) is caused by pollen allergens that may come from grasses, trees, and weeds. Year-round allergic rhinitis (perennial allergic rhinitis) is caused by allergens such as house dust mites, pet dander, and mold spores. What are the signs or symptoms?  Nasal stuffiness (congestion).  Itchy, runny nose with sneezing and tearing of the eyes. How is this diagnosed? Your health care provider can help you determine the allergen or allergens that trigger your symptoms. If you and your health care provider are unable to determine the allergen, skin or blood testing may be used. Your health care provider will diagnose your condition after taking your health history and performing a physical exam. Your health care provider may assess you for other related conditions, such as asthma, pink eye, or an ear  infection. How is this treated? Allergic rhinitis does not have a cure, but it can be controlled by:  Medicines that block allergy symptoms. These may include allergy shots, nasal sprays, and oral antihistamines.  Avoiding the allergen.  Hay fever may often be treated with antihistamines in pill or nasal spray forms. Antihistamines block the effects of histamine. There are over-the-counter medicines that may help with nasal congestion and swelling around the eyes. Check with your health care provider before taking or giving this medicine. If avoiding the allergen or the medicine prescribed do not work, there are many new medicines your health care provider can prescribe. Stronger medicine may be used if initial measures are ineffective. Desensitizing injections can be used if medicine and avoidance does not work. Desensitization is when a patient is given ongoing shots until the body becomes less sensitive to the allergen. Make sure you follow up with your health care provider if problems continue. Follow these instructions at home: It is not possible to completely avoid allergens, but you can reduce your symptoms by taking steps to limit your exposure to them. It helps to know exactly what you are allergic to so that you can avoid your specific triggers. Contact a health care provider if:  You have a fever.  You develop a cough that does not stop easily (persistent).  You have shortness of breath.  You start wheezing.  Symptoms interfere with normal daily activities. This information is not intended to replace advice given to you by your health care provider. Make sure you discuss any questions you have with your health care provider. Document Released: 07/20/2001 Document  Revised: 06/25/2016 Document Reviewed: 07/02/2013 Elsevier Interactive Patient Education  2017 [employer].

## 2017-08-24 DIAGNOSIS — L570 Actinic keratosis: Secondary | ICD-10-CM | POA: Diagnosis not present

## 2017-11-03 ENCOUNTER — Other Ambulatory Visit: Payer: Self-pay

## 2017-11-03 ENCOUNTER — Ambulatory Visit (INDEPENDENT_AMBULATORY_CARE_PROVIDER_SITE_OTHER): Payer: Medicare Other | Admitting: Family Medicine

## 2017-11-03 ENCOUNTER — Encounter: Payer: Self-pay | Admitting: Family Medicine

## 2017-11-03 VITALS — BP 142/70 | HR 54 | Temp 98.2°F | Ht 61.5 in | Wt 164.6 lb

## 2017-11-03 DIAGNOSIS — I10 Essential (primary) hypertension: Secondary | ICD-10-CM | POA: Diagnosis not present

## 2017-11-03 DIAGNOSIS — E669 Obesity, unspecified: Secondary | ICD-10-CM

## 2017-11-03 DIAGNOSIS — M810 Age-related osteoporosis without current pathological fracture: Secondary | ICD-10-CM | POA: Diagnosis not present

## 2017-11-03 DIAGNOSIS — F419 Anxiety disorder, unspecified: Secondary | ICD-10-CM

## 2017-11-03 DIAGNOSIS — E663 Overweight: Secondary | ICD-10-CM | POA: Insufficient documentation

## 2017-11-03 NOTE — Assessment & Plan Note (Signed)
At goal for age.  Continue current regimen.  Check lab work.

## 2017-11-03 NOTE — Progress Notes (Signed)
  Tommi Rumps, MD Phone: [phone]  Morgan Byrd is a 81 y.o. female who presents today for follow-up.  Hypertension: Not checking.  Taking HCTZ, losartan, metoprolol.  No chest pain, shortness of breath, or edema.    Anxiety: Only occurs if there are a lot of people around.  Seldomly takes Xanax.  No depression.  She has gained some weight.  She has not been exercising.  She has been eating a lot of junk food.  She plans to cut back on food intake.  She is also going to start riding a recumbent bike.  Osteoporosis: She has been taking Actonel for at least 5 years if not more.  She does take calcium and vitamin D.  Most recent DEXA scan with T score of -2.5.  No fractures.  Trouble swallowing.  Social History   Tobacco Use  Smoking Status Never Smoker  Smokeless Tobacco Never Used     ROS see history of present illness  Objective  Physical Exam Vitals:   11/03/17 1055  BP: (!) 142/70  Pulse: (!) 54  Temp: 98.2 F (36.8 C)  SpO2: 97%    BP Readings from Last 3 Encounters:  11/03/17 (!) 142/70  08/03/17 126/78  05/04/17 140/80   Wt Readings from Last 3 Encounters:  11/03/17 164 lb 9.6 oz (74.7 kg)  08/03/17 157 lb 6.4 oz (71.4 kg)  05/04/17 153 lb 12.8 oz (69.8 kg)    Physical Exam  Constitutional: No distress.  Cardiovascular: Normal rate, regular rhythm and normal heart sounds.  Pulmonary/Chest: Effort normal and breath sounds normal.  Musculoskeletal: She exhibits no edema.  Neurological: She is alert. Gait normal.  Skin: Skin is warm and dry. She is not diaphoretic.     Assessment/Plan: Please see individual problem list.  Hypertension At goal for age.  Continue current regimen.  Check lab work.  Osteoporosis Has been on Actonel for greater than 5 years.  We will have her do a drug holiday for the next year.  She will continue calcium and vitamin D.  Anxiety Well-controlled.  Obesity (BMI 30.0-34.9) Discussed diet and exercise.  She will  cut down on unhealthy foods.  She will increase her activity level.   Morgan Byrd was seen today for follow-up.  Diagnoses and all orders for this visit:  Essential hypertension -     Cancel: Comp Met (CMET) -     Comp Met (CMET); Future  Obesity (BMI 30.0-34.9) -     Cancel: HgB A1c -     Hemoglobin A1c; Future -     Lipid panel; Future  Age-related osteoporosis without current pathological fracture  Anxiety    Orders Placed This Encounter  Procedures  . Hemoglobin A1c    Standing Status:   Future    Standing Expiration Date:   11/03/2018  . Comp Met (CMET)    Standing Status:   Future    Standing Expiration Date:   11/03/2018  . Lipid panel    Standing Status:   Future    Standing Expiration Date:   11/03/2018    No orders of the defined types were placed in this encounter.    Tommi Rumps, MD [place]

## 2017-11-03 NOTE — Assessment & Plan Note (Signed)
Well controlled 

## 2017-11-03 NOTE — Patient Instructions (Signed)
Nice to see you. Please continue your current blood pressure medications. Please start working on diet and exercise. Please take a holiday from your Actonel.  We will likely resume this in 1 year.

## 2017-11-03 NOTE — Assessment & Plan Note (Signed)
Has been on Actonel for greater than 5 years.  We will have her do a drug holiday for the next year.  She will continue calcium and vitamin D.

## 2017-11-03 NOTE — Assessment & Plan Note (Signed)
Discussed diet and exercise.  She will cut down on unhealthy foods.  She will increase her activity level.

## 2017-11-07 ENCOUNTER — Other Ambulatory Visit: Payer: Self-pay

## 2017-11-07 MED ORDER — LOSARTAN POTASSIUM 100 MG PO TABS: 100.0000 mg | ORAL_TABLET | Freq: Every day | ORAL | 3 refills | Status: DC

## 2017-11-09 ENCOUNTER — Other Ambulatory Visit (INDEPENDENT_AMBULATORY_CARE_PROVIDER_SITE_OTHER): Payer: Medicare Other

## 2017-11-09 ENCOUNTER — Encounter: Payer: Self-pay | Admitting: Family Medicine

## 2017-11-09 DIAGNOSIS — E669 Obesity, unspecified: Secondary | ICD-10-CM | POA: Diagnosis not present

## 2017-11-09 DIAGNOSIS — I10 Essential (primary) hypertension: Secondary | ICD-10-CM

## 2017-11-09 LAB — COMPREHENSIVE METABOLIC PANEL
ALT: 22 U/L (ref 0–35)
AST: 24 U/L (ref 0–37)
Albumin: 4 g/dL (ref 3.5–5.2)
Alkaline Phosphatase: 52 U/L (ref 39–117)
BUN: 24 mg/dL — ABNORMAL HIGH (ref 6–23)
CO2: 29 mEq/L (ref 19–32)
Calcium: 9.4 mg/dL (ref 8.4–10.5)
Chloride: 101 mEq/L (ref 96–112)
Creatinine, Ser: 0.79 mg/dL (ref 0.40–1.20)
GFR: 74.61 mL/min (ref >60.00)
Glucose, Bld: 90 mg/dL (ref 70–99)
Potassium: 4 mEq/L (ref 3.5–5.1)
Sodium: 139 mEq/L (ref 135–145)
Total Bilirubin: 0.8 mg/dL (ref 0.2–1.2)
Total Protein: 7.1 g/dL (ref 6.0–8.3)

## 2017-11-09 LAB — LIPID PANEL
Cholesterol: 180 mg/dL (ref 0–200)
HDL: 61.2 mg/dL (ref >39.00)
LDL Cholesterol: 100 mg/dL — ABNORMAL HIGH (ref 0–99)
NonHDL: 119.26
Total CHOL/HDL Ratio: 3
Triglycerides: 98 mg/dL (ref 0.0–149.0)
VLDL: 19.6 mg/dL (ref 0.0–40.0)

## 2017-11-09 LAB — HEMOGLOBIN A1C: Hgb A1c MFr Bld: 5.6 % (ref 4.6–6.5)

## 2017-12-07 ENCOUNTER — Other Ambulatory Visit: Payer: Self-pay | Admitting: Family Medicine

## 2017-12-07 DIAGNOSIS — M545 Low back pain, unspecified: Secondary | ICD-10-CM

## 2017-12-07 NOTE — Telephone Encounter (Signed)
Last OV 11/03/17 last filled  meloxicam 09/21/16 90 3rf Metoprolol 09/21/16 180 3 rf

## 2018-01-09 ENCOUNTER — Telehealth: Payer: Self-pay | Admitting: Family Medicine

## 2018-01-09 MED ORDER — AMOXICILLIN 500 MG PO CAPS: 2000.0000 mg | ORAL_CAPSULE | Freq: Once | ORAL | 0 refills | Status: AC

## 2018-01-09 NOTE — Telephone Encounter (Signed)
Informed patients husband it has been sent to pharmacy

## 2018-01-09 NOTE — Telephone Encounter (Signed)
Please advise 

## 2018-01-09 NOTE — Telephone Encounter (Signed)
Copied from [place]. Topic: Quick Communication - Rx Refill/Question >> Jan 09, 2018  8:45 AM Marin Olp L wrote: Medication: Needs an antibiotic before her dentist appointment that is tomorrow at 8:30am. She's not sure what antibiotic but says she was prescribed it multiple time in the past.  Has the patient contacted their pharmacy? No.  (new to pharm, new script req)  Agent: If no, request that the patient contact the pharmacy for the refill.)  Preferred Pharmacy (with phone number or street name): [place], [place] [address] [place] [address] Phone: [phone] Fax: [phone]  Agent: Please be advised that RX refills may take up to 3 business days. We ask that you follow-up with your pharmacy.  Patient can't be seen tomorrow at dentist w/o it. She would like a call back asap to find out if the office will be able to fulfill this request.

## 2018-01-09 NOTE — Telephone Encounter (Signed)
Pt of Dr Caryl Bis medication request

## 2018-01-09 NOTE — Telephone Encounter (Signed)
Sent to pharmacy 

## 2018-02-13 ENCOUNTER — Ambulatory Visit: Payer: Medicare Other

## 2018-02-14 ENCOUNTER — Ambulatory Visit (INDEPENDENT_AMBULATORY_CARE_PROVIDER_SITE_OTHER): Payer: Medicare Other

## 2018-02-14 VITALS — BP 110/64 | HR 53 | Temp 98.1°F | Resp 14 | Ht 61.5 in | Wt 163.1 lb

## 2018-02-14 DIAGNOSIS — Z0000 Encounter for general adult medical examination without abnormal findings: Secondary | ICD-10-CM

## 2018-02-14 NOTE — Progress Notes (Signed)
Subjective:   Morgan Byrd is a 81 y.o. female who presents for Medicare Annual (Subsequent) preventive examination.  Review of Systems:  No ROS.  Medicare Wellness Visit. Additional risk factors are reflected in the social history.  Cardiac Risk Factors include: advanced age (>20men, >32 women);hypertension;obesity (BMI >30kg/m2)     Objective:     Vitals: BP 110/64 (BP Location: Left Arm, Patient Position: Sitting, Cuff Size: Normal)   Pulse (!) 53   Temp 98.1 F (36.7 C) (Oral)   Resp 14   Ht 5' 1.5" (1.562 m)   Wt 163 lb 1.9 oz (74 kg)   SpO2 95%   BMI 30.32 kg/m   Body mass index is 30.32 kg/m.  Advanced Directives 02/14/2018 02/10/2017 12/26/2015  Does Patient Have a Medical Advance Directive? Yes Yes No  Type of Paramedic of [place];Living will [place];Living will -  Does patient want to make changes to medical advance directive? No - Patient declined No - Patient declined -  Copy of [place] in Chart? No - copy requested No - copy requested -    Tobacco Social History   Tobacco Use  Smoking Status Never Smoker  Smokeless Tobacco Never Used     Counseling given: Not Answered   Clinical Intake:  Pre-visit preparation completed: Yes  Pain : No/denies pain     Nutritional Status: BMI > 30  Obese Diabetes: No  How often do you need to have someone help you when you read instructions, pamphlets, or other written materials from your doctor or pharmacy?: 1 - Never  Interpreter Needed?: No     Past Medical History:  Diagnosis Date  . Anxiety   . Hypertension    Past Surgical History:  Procedure Laterality Date  . ABDOMINAL HYSTERECTOMY    . BLADDER SUSPENSION    . BREAST BIOPSY Right 1978   neg  . COLONOSCOPY WITH PROPOFOL N/A 12/26/2015   Procedure: COLONOSCOPY WITH PROPOFOL;  Surgeon: Manya Silvas, MD;  Location[facility] ENDOSCOPY;  Service: Endoscopy;  Laterality: N/A;  .  REPLACEMENT TOTAL KNEE BILATERAL    . SHOULDER SURGERY    . VAGINAL DELIVERY     2   Family History  Problem Relation Age of Onset  . Dementia Mother   . Arthritis Mother   . Heart disease Father   . Hypertension Father   . Cancer Father        prostate  . Diabetes Father   . Diabetes Sister   . Cancer Sister        breast  . Breast cancer Sister 82  . Heart disease Sister   . Vasculitis Son   . Kidney disease Son   . Pneumonia Son   . Breast cancer Cousin 37   Social History   Socioeconomic History  . Marital status: Married    Spouse name: Not on file  . Number of children: Not on file  . Years of education: Not on file  . Highest education level: Not on file  Occupational History  . Not on file  Social Needs  . Financial resource strain: Not on file  . Food insecurity:    Worry: Not on file    Inability: Not on file  . Transportation needs:    Medical: No    Non-medical: No  Tobacco Use  . Smoking status: Never Smoker  . Smokeless tobacco: Never Used  Substance and Sexual Activity  .  Alcohol use: No  . Drug use: No  . Sexual activity: Never  Lifestyle  . Physical activity:    Days per week: Not on file    Minutes per session: Not on file  . Stress: Not at all  Relationships  . Social connections:    Talks on phone: Not on file    Gets together: Not on file    Attends religious service: Not on file    Active member of club or organization: Not on file    Attends meetings of clubs or organizations: Not on file    Relationship status: Not on file  Other Topics Concern  . Not on file  Social History Narrative   Lives in [place] with husband. Son and daughter live nearby.      Work - retired Network engineer      Diet - healthy, regular   Exercise - housework    Outpatient Encounter Medications as of 02/14/2018  Medication Sig  . ALPRAZolam (XANAX) 0.25 MG tablet Take 1 tablet (0.25 mg total) by mouth 3 (three) times daily as needed for anxiety.  Marland Kitchen  aspirin 81 MG EC tablet Take 81 mg by mouth daily. Swallow whole.  . B Complex-C (SUPER B COMPLEX PO) Take by mouth.  Marland Kitchen CALCIUM-MAGNESIUM-ZINC PO Take by mouth.  . cetirizine (ZYRTEC) 10 MG tablet Take 10 mg by mouth daily.  . cholecalciferol (VITAMIN D) 1000 UNITS tablet Take 1,000 Units by mouth daily.  . hydrochlorothiazide (MICROZIDE) 12.5 MG capsule Take 1 capsule (12.5 mg total) by mouth daily.  Marland Kitchen losartan (COZAAR) 100 MG tablet Take 1 tablet (100 mg total) by mouth daily.  . meloxicam (MOBIC) 7.5 MG tablet take 1 tablet by mouth once daily with food or milk  . metoprolol tartrate (LOPRESSOR) 50 MG tablet take 1 tablet by mouth twice a day  . Multiple Vitamin (MULTIVITAMIN) capsule Take 1 capsule by mouth daily.  . [DISCONTINUED] risedronate (ACTONEL) 150 MG tablet take 1 tablet by mouth every month on empty stomach with water  - DO NOT EAT, DRINK OR LIE DOWN FOR 30 MINUTES AFTER TAKING   No facility-administered encounter medications on file as of 02/14/2018.     Activities of Daily Living In your present state of health, do you have any difficulty performing the following activities: 02/14/2018  Hearing? N  Vision? N  Difficulty concentrating or making decisions? N  Walking or climbing stairs? N  Dressing or bathing? N  Doing errands, shopping? N  Preparing Food and eating ? N  Using the Toilet? N  In the past six months, have you accidently leaked urine? N  Do you have problems with loss of bowel control? N  Managing your Medications? N  Managing your Finances? N  Housekeeping or managing your Housekeeping? N  Some recent data might be hidden    Patient Care Team: Leone Haven, MD as PCP - General (Family Medicine)    Assessment:   This is a routine wellness examination for Morgan Byrd.  The goal of the wellness visit is to assist the patient how to close the gaps in care and create a preventative care plan for the patient.   The roster of all physicians providing  medical care to patient is listed in the Snapshot section of the chart.  Taking calcium VIT D as appropriate/Osteoporosis reviewed.    Safety issues reviewed; Smoke and carbon monoxide detectors in the home. No firearms in the home. Wears seatbelts when driving or riding with  others. No violence in the home.  They do not have excessive sun exposure.  Discussed the need for sun protection: hats, long sleeves and the use of sunscreen if there is significant sun exposure.  Patient is alert, normal appearance, oriented to person/place/and time.  Correctly identified the president of the [place] and recalls of 2/3 words. Performs simple calculations and can read correct time from watch face. Displays appropriate judgement.  No new identified risk were noted.  No failures at ADL's or IADL's.    BMI- discussed the importance of a healthy diet, water intake and the benefits of aerobic exercise. Educational material provided.   24 hour diet recall: Regular diet  Dental- every 6 months.  [employer].  Eye- Visual acuity not assessed per patient preference since they have regular follow up with the ophthalmologist.  Wears corrective lenses.  Sleep patterns- Sleeps 6-8 hours at night.  Wakes feeling rested.   Health maintenance gaps- closed.  Patient Concerns: None at this time. Follow up with PCP as needed.  Exercise Activities and Dietary recommendations Current Exercise Habits: Home exercise routine, Type of exercise: walking, Time (Minutes): 30, Frequency (Times/Week): 4, Weekly Exercise (Minutes/Week): 120, Intensity: Moderate  Goals    . DIET - INCREASE WATER INTAKE     Stay hydrated     . DIET - REDUCE SUGAR INTAKE       Fall Risk Fall Risk  02/14/2018 08/03/2017 02/10/2017 11/03/2016 08/05/2015  Falls in the past year? No No No No No  Number falls in past yr: - - - - -  Injury with Fall? - - - - -   Depression Screen PHQ 2/9 Scores 02/14/2018 08/03/2017 02/10/2017 08/05/2015    PHQ - 2 Score 0 0 0 0     Cognitive Function MMSE - Mini Mental State Exam 02/14/2018 02/10/2017  Orientation to time 5 5  Orientation to Place 5 5  Registration 3 3  Attention/ Calculation 5 5  Recall 2 3  Language- name 2 objects 2 2  Language- repeat 1 1  Language- follow 3 step command 3 3  Language- read & follow direction 1 1  Write a sentence 1 1  Copy design 1 1  Total score 29 30        Immunization History  Administered Date(s) Administered  . Influenza, High Dose Seasonal PF 08/05/2016  . Influenza,inj,Quad PF,6+ Mos 08/24/2014, 08/05/2015  . Influenza-Unspecified 08/08/2012, 08/11/2013  . Pneumococcal Conjugate-13 05/03/2014  . Pneumococcal Polysaccharide-23 04/25/2005  . Tdap 04/25/2012  . Zoster 06/26/2011   Screening Tests Health Maintenance  Topic Date Due  . INFLUENZA VACCINE  06/08/2018  . TETANUS/TDAP  04/25/2022  . DEXA SCAN  Completed  . PNA vac Low Risk Adult  Completed       Plan:    End of life planning; Advance aging; Advanced directives discussed. Copy of current HCPOA/Living Will requested.    I have personally reviewed and noted the following in the patient's chart:   . Medical and social history . Use of alcohol, tobacco or illicit drugs  . Current medications and supplements . Functional ability and status . Nutritional status . Physical activity . Advanced directives . List of other physicians . Hospitalizations, surgeries, and ER visits in previous 12 months . Vitals . Screenings to include cognitive, depression, and falls . Referrals and appointments  In addition, I have reviewed and discussed with patient certain preventive protocols, quality metrics, and best practice recommendations. A written personalized care plan  for preventive services as well as general preventive health recommendations were provided to patient.     Varney Biles, LPN  02/12/961   Reviewed above information.  Agree with assessment and  plan.    Dr Nicki Reaper

## 2018-02-14 NOTE — Patient Instructions (Addendum)
  Morgan Byrd , Thank you for taking time to come for your Medicare Wellness Visit. I appreciate your ongoing commitment to your health goals. Please review the following plan we discussed and let me know if I can assist you in the future.   Bring a copy of your [place] and/or Living Will to be scanned into chart.  Have a great day!  These are the goals we discussed: Goals    . DIET - INCREASE WATER INTAKE     Stay hydrated     . DIET - REDUCE SUGAR INTAKE       This is a list of the screening recommended for you and due dates:  Health Maintenance  Topic Date Due  . Flu Shot  06/08/2018  . Tetanus Vaccine  04/25/2022  . DEXA scan (bone density measurement)  Completed  . Pneumonia vaccines  Completed

## 2018-02-28 ENCOUNTER — Other Ambulatory Visit: Payer: Self-pay

## 2018-02-28 MED ORDER — HYDROCHLOROTHIAZIDE 12.5 MG PO CAPS: 12.5000 mg | ORAL_CAPSULE | Freq: Every day | ORAL | 3 refills | Status: DC

## 2018-03-27 ENCOUNTER — Other Ambulatory Visit: Payer: Self-pay | Admitting: Family Medicine

## 2018-05-02 ENCOUNTER — Ambulatory Visit (INDEPENDENT_AMBULATORY_CARE_PROVIDER_SITE_OTHER): Payer: Medicare Other | Admitting: Family Medicine

## 2018-05-02 ENCOUNTER — Encounter: Payer: Self-pay | Admitting: Family Medicine

## 2018-05-02 VITALS — BP 138/70 | HR 61 | Temp 97.7°F | Ht 62.0 in | Wt 166.8 lb

## 2018-05-02 DIAGNOSIS — J309 Allergic rhinitis, unspecified: Secondary | ICD-10-CM

## 2018-05-02 DIAGNOSIS — I10 Essential (primary) hypertension: Secondary | ICD-10-CM | POA: Diagnosis not present

## 2018-05-02 DIAGNOSIS — R1310 Dysphagia, unspecified: Secondary | ICD-10-CM

## 2018-05-02 MED ORDER — FLUTICASONE PROPIONATE 50 MCG/ACT NA SUSP: 2.0000 | Freq: Every day | NASAL | 6 refills | Status: DC

## 2018-05-02 MED ORDER — OMEPRAZOLE 20 MG PO CPDR: 20.0000 mg | DELAYED_RELEASE_CAPSULE | Freq: Every day | ORAL | 1 refills | Status: DC

## 2018-05-02 NOTE — Progress Notes (Signed)
  Tommi Rumps, MD Phone: [phone]  Morgan Byrd is a 82 y.o. female who presents today for f/u.  CC: htn, gerd, allergies  HYPERTENSION  Disease Monitoring  Home BP Monitoring 250 systolic recently  Chest pain- no    Dyspnea- no Medications  Compliance-  Taking losartan, metoprolol, HCTZ.  Edema- no  GERD:   Reflux symptoms: 2-3x/week has burning sensation   Abd pain: one episode last week associated with diarrhea, improved with diarrhea, has not recurred Blood in stool: no  Dysphagia: yes, trouble with bread sticking, has been chronic for years, has to drink water to get it to go down   EGD: reports prior history of this  Medication: occasional tums  Allergic rhinitis: Notes this is been going on for about a month.  Has rhinorrhea and postnasal drip with some mild cough.  Not much sneezing.  She tried Zyrtec with no benefit.  No shortness of breath.     Social History   Tobacco Use  Smoking Status Never Smoker  Smokeless Tobacco Never Used     ROS see history of present illness  Objective  Physical Exam Vitals:   05/02/18 1436  BP: 138/70  Pulse: 61  Temp: 97.7 F (36.5 C)  SpO2: 92%    BP Readings from Last 3 Encounters:  05/02/18 138/70  02/14/18 110/64  11/03/17 (!) 142/70   Wt Readings from Last 3 Encounters:  05/02/18 166 lb 12.8 oz (75.7 kg)  02/14/18 163 lb 1.9 oz (74 kg)  11/03/17 164 lb 9.6 oz (74.7 kg)    Physical Exam  Constitutional: No distress.  HENT:  Postnasal drip noted, mild posterior oropharyngeal erythema  Cardiovascular: Normal rate, regular rhythm and normal heart sounds.  Pulmonary/Chest: Effort normal and breath sounds normal.  Abdominal: Soft. Bowel sounds are normal. She exhibits no distension. There is no tenderness.  Musculoskeletal: She exhibits no edema.  Neurological: She is alert.  Skin: Skin is warm and dry. She is not diaphoretic.     Assessment/Plan: Please see individual problem  list.  Hypertension At goal.  Continue current medication.  Check BMP.  Dysphagia Has had issues with this recurrently.  Potentially related to reflux or esophageal narrowing.  Will refer back to GI.  Placed on omeprazole.  Given return precautions.  I suspect she had a viral GI illness leading to her diarrhea and prior abdominal discomfort.  This has not recurred.  Benign abdominal exam.  Allergic rhinitis Symptoms most consistent with this.  Trial of Flonase.   Orders Placed This Encounter  Procedures  . Basic Metabolic Panel (BMET)  . Ambulatory referral to Gastroenterology    Referral Priority:   Routine    Referral Type:   Consultation    Referral Reason:   Specialty Services Required    Number of Visits Requested:   1    Meds ordered this encounter  Medications  . fluticasone (FLONASE) 50 MCG/ACT nasal spray    Sig: Place 2 sprays into both nostrils daily.    Dispense:  16 g    Refill:  6  . omeprazole (PRILOSEC) 20 MG capsule    Sig: Take 1 capsule (20 mg total) by mouth daily.    Dispense:  30 capsule    Refill:  [place], MD [place]

## 2018-05-02 NOTE — Assessment & Plan Note (Signed)
Symptoms most consistent with this.  Trial of Flonase.

## 2018-05-02 NOTE — Assessment & Plan Note (Signed)
At goal.  Continue current medication.  Check BMP. 

## 2018-05-02 NOTE — Patient Instructions (Signed)
Nice to see you. We will start you on omeprazole to see if that helps with your reflux.  We will also refer you to GI. Please use the Flonase to see if that helps with your congestion and drainage. We will check labs and contact you with the results.

## 2018-05-02 NOTE — Assessment & Plan Note (Addendum)
Has had issues with this recurrently.  Potentially related to reflux or esophageal narrowing.  Will refer back to GI.  Placed on omeprazole.  Given return precautions.  I suspect she had a viral GI illness leading to her diarrhea and prior abdominal discomfort.  This has not recurred.  Benign abdominal exam.

## 2018-05-03 ENCOUNTER — Ambulatory Visit: Payer: Medicare Other | Admitting: Family Medicine

## 2018-05-03 LAB — BASIC METABOLIC PANEL
BUN: 27 mg/dL — ABNORMAL HIGH (ref 6–23)
CO2: 29 mEq/L (ref 19–32)
Calcium: 10 mg/dL (ref 8.4–10.5)
Chloride: 102 mEq/L (ref 96–112)
Creatinine, Ser: 0.83 mg/dL (ref 0.40–1.20)
GFR: 70.39 mL/min (ref >60.00)
Glucose, Bld: 93 mg/dL (ref 70–99)
Potassium: 3.9 mEq/L (ref 3.5–5.1)
Sodium: 140 mEq/L (ref 135–145)

## 2018-05-09 DIAGNOSIS — Z85828 Personal history of other malignant neoplasm of skin: Secondary | ICD-10-CM | POA: Diagnosis not present

## 2018-05-09 DIAGNOSIS — L578 Other skin changes due to chronic exposure to nonionizing radiation: Secondary | ICD-10-CM | POA: Diagnosis not present

## 2018-05-09 DIAGNOSIS — Z1283 Encounter for screening for malignant neoplasm of skin: Secondary | ICD-10-CM | POA: Diagnosis not present

## 2018-05-09 DIAGNOSIS — L570 Actinic keratosis: Secondary | ICD-10-CM | POA: Diagnosis not present

## 2018-05-09 DIAGNOSIS — L821 Other seborrheic keratosis: Secondary | ICD-10-CM | POA: Diagnosis not present

## 2018-05-15 ENCOUNTER — Encounter: Payer: Self-pay | Admitting: Family Medicine

## 2018-05-18 ENCOUNTER — Other Ambulatory Visit: Payer: Self-pay | Admitting: Nurse Practitioner

## 2018-05-18 DIAGNOSIS — R12 Heartburn: Secondary | ICD-10-CM

## 2018-05-18 DIAGNOSIS — R1319 Other dysphagia: Secondary | ICD-10-CM | POA: Diagnosis not present

## 2018-05-18 DIAGNOSIS — R0989 Other specified symptoms and signs involving the circulatory and respiratory systems: Secondary | ICD-10-CM | POA: Diagnosis not present

## 2018-05-21 ENCOUNTER — Telehealth: Payer: Self-pay | Admitting: Family Medicine

## 2018-05-21 NOTE — Telephone Encounter (Signed)
"  Dr. Caryl Byrd,   Morgan Paradise, NP (Gastroenterology - [facility]) saw Morgan Byrd yesterday in clinic for an office visit. She was seen for dysphagia and will have a barium swallow with tablet and upper GI series (without KUB) for further investigation. However, upon Morgan Byrd's assessment the patient reported that Dr. Gorden Byrd found a bruit on her abdomen many years ago and it was not investigated. Morgan Byrd also found a soft left femoral bruit - no edema in that LLE.   We asked the patient to make you aware, but also wanted to touch base and let you know.   Thanks so much,   Morgan Bal, RN"  Received the above message. Please contact the patient so we can get her set up for follow up to determine appropriate work up. She could be added at 44 on July 25th.

## 2018-05-22 ENCOUNTER — Ambulatory Visit
Admission: RE | Admit: 2018-05-22 | Discharge: 2018-05-22 | Disposition: A | Discharge status: Home / Self Care | Payer: Medicare Other | Source: Ambulatory Visit | Attending: Nurse Practitioner | Admitting: Nurse Practitioner

## 2018-05-22 ENCOUNTER — Other Ambulatory Visit: Payer: Self-pay | Admitting: Nurse Practitioner

## 2018-05-22 DIAGNOSIS — R1319 Other dysphagia: Secondary | ICD-10-CM

## 2018-05-22 DIAGNOSIS — R12 Heartburn: Secondary | ICD-10-CM

## 2018-05-22 DIAGNOSIS — K219 Gastro-esophageal reflux disease without esophagitis: Secondary | ICD-10-CM | POA: Insufficient documentation

## 2018-05-22 DIAGNOSIS — K228 Other specified diseases of esophagus: Secondary | ICD-10-CM | POA: Insufficient documentation

## 2018-05-22 NOTE — Telephone Encounter (Signed)
Patient notified and scheduled 

## 2018-06-01 ENCOUNTER — Ambulatory Visit: Payer: Medicare Other | Admitting: Family Medicine

## 2018-06-01 ENCOUNTER — Encounter: Payer: Self-pay | Admitting: Family Medicine

## 2018-06-01 VITALS — BP 120/80 | HR 53 | Temp 98.1°F | Ht 62.0 in | Wt 168.8 lb

## 2018-06-01 DIAGNOSIS — J309 Allergic rhinitis, unspecified: Secondary | ICD-10-CM | POA: Diagnosis not present

## 2018-06-01 DIAGNOSIS — R0989 Other specified symptoms and signs involving the circulatory and respiratory systems: Secondary | ICD-10-CM | POA: Insufficient documentation

## 2018-06-01 DIAGNOSIS — R1310 Dysphagia, unspecified: Secondary | ICD-10-CM | POA: Diagnosis not present

## 2018-06-01 DIAGNOSIS — I10 Essential (primary) hypertension: Secondary | ICD-10-CM | POA: Diagnosis not present

## 2018-06-01 MED ORDER — OMEPRAZOLE 20 MG PO CPDR: 20.0000 mg | DELAYED_RELEASE_CAPSULE | Freq: Every day | ORAL | 1 refills | Status: DC

## 2018-06-01 NOTE — Assessment & Plan Note (Signed)
Improved with omeprazole.  She had evaluation through GI.  Refill of omeprazole given.  She will consider whether or not she will follow-up with GI for reevaluation as planned.

## 2018-06-01 NOTE — Patient Instructions (Signed)
Nice to see you. Please continue on the omeprazole. Please try Claritin to see if that helps with your allergies.  Continue the Flonase.  If your symptoms do not improve please let us know we can try Singulair. We will refer you to vascular surgery for the femoral bruit.

## 2018-06-01 NOTE — Progress Notes (Signed)
  Tommi Rumps, MD Phone: [phone]  Morgan Byrd is a 82 y.o. female who presents today for f/u.  CC: htn, allergies, dysphagia, femoral bruit  HYPERTENSION  Disease Monitoring  Home BP Monitoring similar to today Chest pain- no    Dyspnea- no Medications  Compliance-  Taking losartan, HCTZ, metoprolol.  Edema- no  Dysphagia/GERD: She saw GI.  They did a barium swallow test that revealed mild reflux and some tertiary contractions of the distal third of the esophagus.  She notes they advised her to continue omeprazole which has been helpful and she has not had any symptoms.  No dysphagia.  No blood in her stool.  Femoral bruit: Noted on exam by GI.  Patient reports this is been present for 10+ years since she was seeing Dr. Gorden Harms.  She notes no swelling or claudication symptoms.  Allergic rhinitis: Zyrtec has not been helpful.  Flonase has helped.  She notes some postnasal drip and hoarseness.  Some sneezing.  Some rhinorrhea.    Social History   Tobacco Use  Smoking Status Never Smoker  Smokeless Tobacco Never Used     ROS see history of present illness  Objective  Physical Exam Vitals:   06/01/18 1143  BP: 120/80  Pulse: (!) 53  Temp: 98.1 F (36.7 C)  SpO2: 96%    BP Readings from Last 3 Encounters:  06/01/18 120/80  05/02/18 138/70  02/14/18 110/64   Wt Readings from Last 3 Encounters:  06/01/18 168 lb 12.8 oz (76.6 kg)  05/02/18 166 lb 12.8 oz (75.7 kg)  02/14/18 163 lb 1.9 oz (74 kg)    Physical Exam  Constitutional: No distress.  Cardiovascular: Normal rate, regular rhythm and normal heart sounds.  No abdominal bruits, there is a left femoral bruit with 2+ femoral pulse left and right, no right femoral bruit, 2+ DP and PT pulses bilaterally  Pulmonary/Chest: Effort normal and breath sounds normal.  Abdominal: Soft. Bowel sounds are normal. She exhibits no distension. There is no tenderness.  Musculoskeletal: She exhibits no edema.    Neurological: She is alert.  Skin: Skin is warm and dry. She is not diaphoretic.     Assessment/Plan: Please see individual problem list.  Hypertension Well-controlled.  Continue current regimen.  Allergic rhinitis Continue Flonase.  Trial of Claritin.  If not improving consider Singulair  Dysphagia Improved with omeprazole.  She had evaluation through GI.  Refill of omeprazole given.  She will consider whether or not she will follow-up with GI for reevaluation as planned.  Femoral bruit Refer to vascular surgery.   Orders Placed This Encounter  Procedures  . Ambulatory referral to Vascular Surgery    Referral Priority:   Routine    Referral Type:   Surgical    Referral Reason:   Specialty Services Required    Requested Specialty:   Vascular Surgery    Number of Visits Requested:   1    Meds ordered this encounter  Medications  . omeprazole (PRILOSEC) 20 MG capsule    Sig: Take 1 capsule (20 mg total) by mouth daily.    Dispense:  90 capsule    Refill:  1     Tommi Rumps, MD [place]

## 2018-06-01 NOTE — Assessment & Plan Note (Signed)
Continue Flonase.  Trial of Claritin.  If not improving consider Singulair

## 2018-06-01 NOTE — Assessment & Plan Note (Signed)
Refer to vascular surgery. 

## 2018-06-01 NOTE — Assessment & Plan Note (Signed)
Well-controlled.  Continue current regimen. 

## 2018-06-21 ENCOUNTER — Other Ambulatory Visit: Payer: Self-pay | Admitting: Family Medicine

## 2018-06-21 DIAGNOSIS — M545 Low back pain, unspecified: Secondary | ICD-10-CM

## 2018-06-21 DIAGNOSIS — Z1231 Encounter for screening mammogram for malignant neoplasm of breast: Secondary | ICD-10-CM

## 2018-06-22 NOTE — Telephone Encounter (Signed)
Last OV 06/01/18 last filled 12/07/17 90 1rf

## 2018-06-26 ENCOUNTER — Telehealth: Payer: Self-pay | Admitting: Family Medicine

## 2018-06-26 MED ORDER — AMOXICILLIN 500 MG PO CAPS: 2000.0000 mg | ORAL_CAPSULE | Freq: Once | ORAL | 0 refills | Status: AC

## 2018-06-26 NOTE — Telephone Encounter (Signed)
Rx for antibiotics for dental procedure.

## 2018-06-26 NOTE — Telephone Encounter (Signed)
Copied from [place] [id number]. Topic: General - Other >> Jun 26, 2018  9:01 AM Lennox Solders wrote: Reason for CRM: pt is calling and needs 4 abx pills for dental work tomorrow. Pt had knee replacement. [facility] in [place]. Pt does not remember the name of abx. Pt states everytime she has dental work abx is needed

## 2018-06-26 NOTE — Telephone Encounter (Signed)
Sent to pharmacy 

## 2018-06-27 ENCOUNTER — Ambulatory Visit (INDEPENDENT_AMBULATORY_CARE_PROVIDER_SITE_OTHER): Payer: Medicare Other | Admitting: Vascular Surgery

## 2018-06-27 ENCOUNTER — Encounter (INDEPENDENT_AMBULATORY_CARE_PROVIDER_SITE_OTHER): Payer: Self-pay | Admitting: Vascular Surgery

## 2018-06-27 ENCOUNTER — Other Ambulatory Visit: Payer: Self-pay

## 2018-06-27 VITALS — BP 125/67 | HR 53 | Ht 62.0 in | Wt 170.0 lb

## 2018-06-27 DIAGNOSIS — R0989 Other specified symptoms and signs involving the circulatory and respiratory systems: Secondary | ICD-10-CM | POA: Diagnosis not present

## 2018-06-27 DIAGNOSIS — I10 Essential (primary) hypertension: Secondary | ICD-10-CM

## 2018-06-27 DIAGNOSIS — I7300 Raynaud's syndrome without gangrene: Secondary | ICD-10-CM | POA: Diagnosis not present

## 2018-06-27 NOTE — Progress Notes (Signed)
Patient ID: Morgan Byrd, female   DOB: September 09, 1936, 82 y.o.   MRN: [id number]  Chief Complaint  Patient presents with  . Leg Pain    Dr. Caryl Bis refer for femoral bruit    HPI Morgan Byrd is a 82 y.o. female.  I am asked to see the patient by Dr. Caryl Bis for evaluation of a femoral bruit.  The patient reports her previous primary care physician about 10 years ago had noticed a femoral bruit but she had not had any specific work-up.  She denies any lifestyle limiting claudication, ischemic rest pain, or ulceration of the lower extremities.  No history of aneurysm related symptoms. Specifically, the patient denies new back or abdominal pain, or signs of peripheral embolization    Past Medical History:  Diagnosis Date  . Anxiety   . Hypertension     Past Surgical History:  Procedure Laterality Date  . ABDOMINAL HYSTERECTOMY    . BLADDER SUSPENSION    . BREAST BIOPSY Right 1978   neg  . COLONOSCOPY WITH PROPOFOL N/A 12/26/2015   Procedure: COLONOSCOPY WITH PROPOFOL;  Surgeon: Manya Silvas, MD;  Location: [facility] ENDOSCOPY;  Service: Endoscopy;  Laterality: N/A;  . REPLACEMENT TOTAL KNEE BILATERAL    . SHOULDER SURGERY    . VAGINAL DELIVERY     2    Family History  Problem Relation Age of Onset  . Dementia Mother   . Arthritis Mother   . Heart disease Father   . Hypertension Father   . Cancer Father        prostate  . Diabetes Father   . Diabetes Sister   . Cancer Sister        breast  . Breast cancer Sister 67  . Heart disease Sister   . Vasculitis Son   . Kidney disease Son   . Pneumonia Son   . Breast cancer Cousin 62     Social History Social History   Tobacco Use  . Smoking status: Never Smoker  . Smokeless tobacco: Never Used  Substance Use Topics  . Alcohol use: No  . Drug use: No     Allergies  Allergen Reactions  . Codeine Nausea And Vomiting  . Fosamax [Alendronate Sodium]   . Lexapro [Escitalopram Oxalate]     Current  Outpatient Medications  Medication Sig Dispense Refill  . aspirin 81 MG EC tablet Take 81 mg by mouth daily. Swallow whole.    . B Complex-C (SUPER B COMPLEX PO) Take by mouth.    Marland Kitchen CALCIUM-MAGNESIUM-ZINC PO Take by mouth.    . cetirizine (ZYRTEC) 10 MG tablet Take 10 mg by mouth daily.    . cholecalciferol (VITAMIN D) 1000 UNITS tablet Take 1,000 Units by mouth daily.    . fluticasone (FLONASE) 50 MCG/ACT nasal spray Place 2 sprays into both nostrils daily. 16 g 6  . hydrochlorothiazide (MICROZIDE) 12.5 MG capsule Take 1 capsule (12.5 mg total) by mouth daily. 90 capsule 3  . losartan (COZAAR) 100 MG tablet Take 1 tablet (100 mg total) by mouth daily. 90 tablet 3  . meloxicam (MOBIC) 7.5 MG tablet TAKE 1 TABLET BY MOUTH DAILY WITH FOOD OR MILK 90 tablet 0  . metoprolol tartrate (LOPRESSOR) 50 MG tablet TAKE 1 TABLET BY MOUTH TWICE DAILY 180 tablet 1  . Multiple Vitamin (MULTIVITAMIN) capsule Take 1 capsule by mouth daily.    Marland Kitchen omeprazole (PRILOSEC) 20 MG capsule Take 1 capsule (20 mg total) by mouth daily.  90 capsule 1  . ALPRAZolam (XANAX) 0.25 MG tablet Take 1 tablet (0.25 mg total) by mouth 3 (three) times daily as needed for anxiety. (Patient not taking: Reported on 06/27/2018) 20 tablet 0  . amoxicillin (AMOXIL) 500 MG capsule      No current facility-administered medications for this visit.       REVIEW OF SYSTEMS (Negative unless checked)  Constitutional: [] Weight loss  [] Fever  [] Chills Cardiac: [] Chest pain   [] Chest pressure   [] Palpitations   [] Shortness of breath when laying flat   [] Shortness of breath at rest   [] Shortness of breath with exertion. Vascular:  [] Pain in legs with walking   [] Pain in legs at rest   [] Pain in legs when laying flat   [] Claudication   [] Pain in feet when walking  [] Pain in feet at rest  [] Pain in feet when laying flat   [] History of DVT   [] Phlebitis   [] Swelling in legs   [] Varicose veins   [] Non-healing ulcers Pulmonary:   [] Uses home oxygen    [] Productive cough   [] Hemoptysis   [] Wheeze  [] COPD   [] Asthma Neurologic:  [] Dizziness  [] Blackouts   [] Seizures   [] History of stroke   [] History of TIA  [] Aphasia   [] Temporary blindness   [] Dysphagia   [] Weakness or numbness in arms   [] Weakness or numbness in legs Musculoskeletal:  [x] Arthritis   [] Joint swelling   [] Joint pain   [] Low back pain Hematologic:  [] Easy bruising  [] Easy bleeding   [] Hypercoagulable state   [] Anemic  [] Hepatitis Gastrointestinal:  [] Blood in stool   [] Vomiting blood  [x] Gastroesophageal reflux/heartburn   [] Abdominal pain Genitourinary:  [] Chronic kidney disease   [] Difficult urination  [] Frequent urination  [] Burning with urination   [] Hematuria Skin:  [] Rashes   [] Ulcers   [] Wounds Psychological:  [x] History of anxiety   []  History of major depression.    Physical Exam BP 125/67 (BP Location: Right Arm)   Pulse (!) 53   Ht 5\' 2"  (1.575 m)   Wt 170 lb (77.1 kg)   BMI 31.09 kg/m  Gen:  WD/WN, NAD. Appears younger than stated age Head: [place]/AT, No temporalis wasting.  Ear/Nose/Throat: Hearing grossly intact, nares w/o erythema or drainage, oropharynx w/o Erythema/Exudate Eyes: Conjunctiva clear, sclera non-icteric  Neck: trachea midline.  No bruit or JVD.  Pulmonary:  Good air movement, clear to auscultation bilaterally.  Cardiac: RRR, normal S1, S2 Vascular:  Vessel Right Left  Radial Palpable Palpable                  Femoral Palpable with bruit Palpable      PT Palpable 1+ Palpable  DP Palpable Palpable    Musculoskeletal: M/S 5/5 throughout.  Extremities without ischemic changes.  No deformity or atrophy. No edema. Neurologic: Sensation grossly intact in extremities.  Symmetrical.  Speech is fluent. Motor exam as listed above. Psychiatric: Judgment intact, Mood & affect appropriate for pt's clinical situation. Dermatologic: No rashes or ulcers noted.  No cellulitis or open wounds.  Radiology No results found.  Labs Recent Results  (from the past 2160 hour(s))  Basic Metabolic Panel (BMET)     Status: Abnormal   Collection Time: 05/02/18  3:17 PM  Result Value Ref Range   Sodium 140 135 - 145 mEq/L   Potassium 3.9 3.5 - 5.1 mEq/L   Chloride 102 96 - 112 mEq/L   CO2 29 19 - 32 mEq/L   Glucose, Bld 93 70 - 99 mg/dL  BUN 27 (H) 6 - 23 mg/dL   Creatinine, Ser 0.83 0.40 - 1.20 mg/dL   Calcium 10.0 8.4 - 10.5 mg/dL   GFR 70.39 >60.00 mL/min    Assessment/Plan:  Hypertension blood pressure control important in reducing the progression of atherosclerotic disease. On appropriate oral medications.   Raynaud phenomenon Mild and not overly symptomatic at this time.  Femoral bruit I think an aortoiliac duplex to assess her femoral bruit would be prudent.  I have discussed that many bruits are not related to significant disease and are basically incidental findings.  Some, however, do indicate significant atherosclerotic disease or aneurysmal disease.  We will obtain this noninvasive study in the near future at her convenience.  She will continue her aspirin therapy.  I will see her back following the study      Leotis Pain 06/27/2018, 11:25 AM   This note was created with Dragon medical transcription system.  Any errors from dictation are unintentional.

## 2018-06-27 NOTE — Assessment & Plan Note (Signed)
I think an aortoiliac duplex to assess her femoral bruit would be prudent.  I have discussed that many bruits are not related to significant disease and are basically incidental findings.  Some, however, do indicate significant atherosclerotic disease or aneurysmal disease.  We will obtain this noninvasive study in the near future at her convenience.  She will continue her aspirin therapy.  I will see her back following the study

## 2018-06-27 NOTE — Patient Instructions (Signed)

## 2018-06-27 NOTE — Assessment & Plan Note (Signed)
Mild and not overly symptomatic at this time.

## 2018-06-27 NOTE — Assessment & Plan Note (Signed)
blood pressure control important in reducing the progression of atherosclerotic disease. On appropriate oral medications.  

## 2018-06-29 ENCOUNTER — Encounter (INDEPENDENT_AMBULATORY_CARE_PROVIDER_SITE_OTHER): Payer: Self-pay | Admitting: Vascular Surgery

## 2018-06-29 ENCOUNTER — Telehealth (INDEPENDENT_AMBULATORY_CARE_PROVIDER_SITE_OTHER): Payer: Self-pay | Admitting: Nurse Practitioner

## 2018-06-29 ENCOUNTER — Encounter (INDEPENDENT_AMBULATORY_CARE_PROVIDER_SITE_OTHER): Payer: Self-pay

## 2018-06-29 ENCOUNTER — Ambulatory Visit (INDEPENDENT_AMBULATORY_CARE_PROVIDER_SITE_OTHER): Payer: Medicare Other

## 2018-06-29 ENCOUNTER — Ambulatory Visit (INDEPENDENT_AMBULATORY_CARE_PROVIDER_SITE_OTHER): Payer: Medicare Other | Admitting: Nurse Practitioner

## 2018-06-29 DIAGNOSIS — R0989 Other specified symptoms and signs involving the circulatory and respiratory systems: Secondary | ICD-10-CM | POA: Diagnosis not present

## 2018-06-29 NOTE — Telephone Encounter (Signed)
I spoke directly with Morgan Byrd regarding her test results of her aortoiliac duplex.  Inform Morgan Byrd that her plaque indicates a less than 50% stenosis in her vessels and that currently it is not something we would need to intervene on at this time.  I advised her of signs and symptoms that would indicate intervention was necessary such as claudication, rest pain, weakness.  I advised her that we would follow-up and repeat her studies with the year as well as with an ABI.  I advised her to contact the office should anything change in she feels she needs to be seen sooner.  The patient agreed and understood.

## 2018-07-11 ENCOUNTER — Encounter: Payer: Self-pay | Admitting: Family

## 2018-07-14 ENCOUNTER — Other Ambulatory Visit: Payer: Self-pay | Admitting: Family

## 2018-07-14 ENCOUNTER — Encounter: Payer: Self-pay | Admitting: Family

## 2018-07-14 MED ORDER — AMOXICILLIN 500 MG PO CAPS: 2000.0000 mg | ORAL_CAPSULE | Freq: Once | ORAL | 0 refills | Status: AC

## 2018-07-20 ENCOUNTER — Encounter: Payer: Self-pay | Admitting: Family Medicine

## 2018-07-20 ENCOUNTER — Ambulatory Visit (INDEPENDENT_AMBULATORY_CARE_PROVIDER_SITE_OTHER): Payer: Medicare Other | Admitting: Family Medicine

## 2018-07-20 VITALS — BP 136/72 | HR 61 | Temp 98.2°F | Ht 62.0 in | Wt 169.8 lb

## 2018-07-20 DIAGNOSIS — J309 Allergic rhinitis, unspecified: Secondary | ICD-10-CM

## 2018-07-20 DIAGNOSIS — R05 Cough: Secondary | ICD-10-CM

## 2018-07-20 DIAGNOSIS — R059 Cough, unspecified: Secondary | ICD-10-CM

## 2018-07-20 MED ORDER — BENZONATATE 100 MG PO CAPS: 100.0000 mg | ORAL_CAPSULE | Freq: Three times a day (TID) | ORAL | 1 refills | Status: DC | PRN

## 2018-07-20 MED ORDER — FEXOFENADINE HCL 180 MG PO TABS: 180.0000 mg | ORAL_TABLET | Freq: Every day | ORAL | 1 refills | Status: DC

## 2018-07-20 MED ORDER — FLUTICASONE PROPIONATE 50 MCG/ACT NA SUSP: 2.0000 | Freq: Every day | NASAL | 6 refills | Status: DC

## 2018-07-20 MED ORDER — MONTELUKAST SODIUM 10 MG PO TABS
10.0000 mg | ORAL_TABLET | Freq: Every day | ORAL | 1 refills | Status: DC
Start: 2018-07-20 — End: 2018-10-14

## 2018-07-20 NOTE — Patient Instructions (Signed)

## 2018-07-20 NOTE — Progress Notes (Signed)
Subjective:    Patient ID: Morgan Byrd, female    DOB: [birth year], 82 y.o.   MRN: [id number]  HPI   Patient presents to clinic complaining of nasal drainage, hoarseness off and on for many weeks.  Also has cough, sometimes will cough up white phlegm.  Patient states she has tried different allergy medications including Zyrtec and Claritin without much relief.  She uses Flonase with some help in reducing congestion symptoms.  Patient states her allergies have been worse all summer because he does not have a job to move the lawn, states her husband used to do this but he no longer is able to do so.   Denies any fever or chills.  Denies any shortness of breath or wheezing.  Patient Active Problem List   Diagnosis Date Noted  . Femoral bruit 06/01/2018  . Allergic rhinitis 05/02/2018  . Obesity (BMI 30.0-34.9) 11/03/2017  . Anxiety 08/05/2016  . Neuropathy 02/02/2016  . Dysphagia 10/09/2012  . Hypertension 04/25/2012  . Low back pain 04/25/2012  . Osteoporosis 04/25/2012  . Raynaud phenomenon 04/25/2012   Social History   Tobacco Use  . Smoking status: Never Smoker  . Smokeless tobacco: Never Used  Substance Use Topics  . Alcohol use: No    Review of Systems  Constitutional: Negative for chills, fatigue and fever.  HENT: positive for congestion, ear pain, sinus pain, runny nose, hoarse voice.   Eyes: Negative.   Respiratory: +cough. Negative for shortness of breath and wheezing.   Cardiovascular: Negative for chest pain, palpitations and leg swelling.  Gastrointestinal: Negative for abdominal pain, diarrhea, nausea and vomiting.  Genitourinary: Negative for dysuria, frequency and urgency.  Musculoskeletal: Negative for arthralgias and myalgias.  Skin: Negative for color change, pallor and rash.  Neurological: Negative for syncope, light-headedness and headaches.  Psychiatric/Behavioral: The patient is not nervous/anxious.       Objective:   Physical Exam    Constitutional: She is oriented to person, place, and time. She appears well-nourished. No distress.  HENT:  Head: Normocephalic and atraumatic.  Mouth/Throat: No oropharyngeal exudate.  Fullness bilat tympanic membranes, they are not red.  Ear canals normal.  Positive clear rhinorrhea.  Positive postnasal drip.  Positive inflammation bilateral nares.  Voice is a little scratchy.  Eyes: Conjunctivae and EOM are normal. Right eye exhibits no discharge. Left eye exhibits no discharge. No scleral icterus.  Neck: Neck supple. No tracheal deviation present.  Cardiovascular: Normal rate and regular rhythm.  Pulmonary/Chest: Effort normal and breath sounds normal. No respiratory distress. She has no wheezes. She has no rales.  Musculoskeletal: She exhibits no edema.  Lymphadenopathy:    She has no cervical adenopathy.  Neurological: She is alert and oriented to person, place, and time. No cranial nerve deficit.  Skin: Skin is warm and dry. No pallor.  Psychiatric: She has a normal mood and affect. Her behavior is normal.  Nursing note and vitals reviewed.     Vitals:   07/20/18 0920  BP: 136/72  Pulse: 61  Temp: 98.2 F (36.8 C)  SpO2: 94%   Assessment & Plan:    Chronic allergic rhinitis --we will try different allergy medications, patient will take Allegra 180 mg in the a.m. and takes Singulair 10 mg in the p.m.  She will continue to use Flonase nasal spray.  Cough- I suspect cough is related to postnasal drip due to lungs being clear on exam.  Patient will use Tessalon Perles as needed.  Keep up good  fluid intake & try to avoid any allergy triggers.  Patient will keep regular follow-up visit as already scheduled in December 2019.  Patient advised to return to clinic sooner if current symptoms persist or worsen.

## 2018-07-26 ENCOUNTER — Ambulatory Visit
Admission: RE | Admit: 2018-07-26 | Discharge: 2018-07-26 | Disposition: A | Discharge status: Home / Self Care | Payer: Medicare Other | Source: Ambulatory Visit | Attending: Family Medicine | Admitting: Family Medicine

## 2018-07-26 DIAGNOSIS — Z1231 Encounter for screening mammogram for malignant neoplasm of breast: Secondary | ICD-10-CM | POA: Diagnosis not present

## 2018-08-26 ENCOUNTER — Other Ambulatory Visit: Payer: Self-pay | Admitting: Family Medicine

## 2018-09-06 ENCOUNTER — Telehealth: Payer: Self-pay | Admitting: Family Medicine

## 2018-09-06 NOTE — Telephone Encounter (Signed)
Copied from [place] [id number]. Topic: Referral - Request for Referral >> Sep 06, 2018  3:43 PM Ahmed Prima L wrote: Has patient seen PCP for this complaint? No *If NO, is insurance requiring patient see PCP for this issue before PCP can refer them? She does not know Referral for which specialty: Orthopedics  Preferred provider/office: Dr Harlow Mares at [facility]  Reason for referral: Left Shoulder Pain

## 2018-09-06 NOTE — Telephone Encounter (Signed)
Sent to PCP does pt need an OV first? Or are you able to place referral without an OV   Pain started about 1 month ago. Pt noticed the pain after bending over to pick up a load of wet clothes. Pt stated at night she will feel pain while sleeping, while opening kitchen cabinets, holding her phone.   Pt is willing to schedule for an OV if needed.

## 2018-09-06 NOTE — Telephone Encounter (Signed)
I would suggest a visit in the office to evaluate further though she could just go to the [facility] walk-in clinic between 1 and 7 PM during the week and have it evaluated by them as well.  If she would prefer to be seen in our clinic she could see myself or Lauren for this.

## 2018-09-07 NOTE — Telephone Encounter (Signed)
Called and spoke with pt. Pt advised and voiced understanding. Pt has been scheduled for an OV with Lauren on Sep 11, 2018.

## 2018-09-11 ENCOUNTER — Ambulatory Visit (INDEPENDENT_AMBULATORY_CARE_PROVIDER_SITE_OTHER): Payer: Medicare Other

## 2018-09-11 ENCOUNTER — Encounter: Payer: Self-pay | Admitting: Family Medicine

## 2018-09-11 ENCOUNTER — Ambulatory Visit (INDEPENDENT_AMBULATORY_CARE_PROVIDER_SITE_OTHER): Payer: Medicare Other | Admitting: Family Medicine

## 2018-09-11 VITALS — BP 134/60 | HR 56 | Temp 98.0°F | Ht 61.5 in | Wt 162.0 lb

## 2018-09-11 DIAGNOSIS — M25512 Pain in left shoulder: Secondary | ICD-10-CM

## 2018-09-11 DIAGNOSIS — M19012 Primary osteoarthritis, left shoulder: Secondary | ICD-10-CM | POA: Diagnosis not present

## 2018-09-11 DIAGNOSIS — M542 Cervicalgia: Secondary | ICD-10-CM

## 2018-09-11 DIAGNOSIS — M4802 Spinal stenosis, cervical region: Secondary | ICD-10-CM | POA: Diagnosis not present

## 2018-09-11 NOTE — Progress Notes (Signed)
Subjective:    Patient ID: Morgan Byrd, female    DOB: 1935-12-30, 82 y.o.   MRN: [id number]  HPI  Presents to clinic due to left shoulder pain. Noticed it when bending over to pick wet clothes up out of washer 2 weeks ago & also notices it when reaching up to get something out of cabinet.  Denies any fall or known injury to the left shoulder.  Does state the pain also goes up into left side of her neck at times, has a pulling type pain when reaching with her left arm.  Denies any numbness or tingling in arm.  Denies chest pain, short of breath, palpitations, fever or chills.  Patient Active Problem List   Diagnosis Date Noted  . Femoral bruit 06/01/2018  . Allergic rhinitis 05/02/2018  . Obesity (BMI 30.0-34.9) 11/03/2017  . Anxiety 08/05/2016  . Neuropathy 02/02/2016  . Dysphagia 10/09/2012  . Hypertension 04/25/2012  . Low back pain 04/25/2012  . Osteoporosis 04/25/2012  . Raynaud phenomenon 04/25/2012   Social History   Tobacco Use  . Smoking status: Never Smoker  . Smokeless tobacco: Never Used  Substance Use Topics  . Alcohol use: No   Review of Systems   Constitutional: Negative for chills, fatigue and fever.  HENT: Negative for congestion, ear pain, sinus pain and sore throat.   Eyes: Negative.   Respiratory: Negative for cough, shortness of breath and wheezing.   Cardiovascular: Negative for chest pain, palpitations and leg swelling.  Gastrointestinal: Negative for abdominal pain, diarrhea, nausea and vomiting.  Genitourinary: Negative for dysuria, frequency and urgency.  Musculoskeletal: +left shoulder pain Skin: Negative for color change, pallor and rash.  Neurological: Negative for syncope, light-headedness and headaches.  Psychiatric/Behavioral: The patient is not nervous/anxious.       Objective:   Physical Exam  Constitutional: She is oriented to person, place, and time. She appears well-nourished. No distress.  HENT:  Head: Normocephalic.    Eyes: Conjunctivae and EOM are normal. No scleral icterus.  Neck: Normal range of motion. Neck supple. No JVD present. No tracheal deviation present.  Cardiovascular: Normal rate and regular rhythm.  Pulmonary/Chest: Effort normal and breath sounds normal. No respiratory distress.  Musculoskeletal: She exhibits no edema or deformity.       Left shoulder: She exhibits tenderness.  Positive tenderness of left AC joint with palpation of area.  Patient is able to reach arm straight up above head, put hand behind back of head and behind low back, is able to reach left arm across chest to grab right shoulder.  Patient is also able to hold left arm straight out in front of her and resist me pushing down.  Patient states when pain happens in left neck it will go down into left shoulder and then down into bicep area of left upper arm.  Lymphadenopathy:    She has no cervical adenopathy.  Neurological: She is alert and oriented to person, place, and time.  Skin: Skin is warm and dry. No rash noted. No erythema. No pallor.  Psychiatric: She has a normal mood and affect. Her behavior is normal.  Nursing note and vitals reviewed.     Vitals:   09/11/18 0917  BP: 134/60  Pulse: (!) 56  Temp: 98 F (36.7 C)  SpO2: 95%   Assessment & Plan:   Left shoulder pain/neck pain-we will get x-ray of both left shoulder and neck.  Patient offered low-dose steroid taper to see if this will calm  down pain & inflammation, but she declines.  Patient advised to try a topical rub such as BenGay or Biofreeze to soothe pain and also can use heating pad on sore area.  Patient aware she can use Tylenol as needed for pain as well, but I WOULD NOT recommend excess use of ibuprofen due to patient already taking aspirin.  Patient would like referral to orthopedic doctor that her daughter recommended, Dr. Harlow Mares.  Referral placed for patient.  Keep regularly scheduled follow-up with PCP as planned.  Patient will be made aware  of x-ray results when they are available.

## 2018-09-15 ENCOUNTER — Encounter (INDEPENDENT_AMBULATORY_CARE_PROVIDER_SITE_OTHER): Payer: Self-pay | Admitting: Nurse Practitioner

## 2018-09-15 NOTE — Progress Notes (Signed)
This encounter was  cancelled in error

## 2018-09-19 ENCOUNTER — Other Ambulatory Visit: Payer: Self-pay | Admitting: Family Medicine

## 2018-09-19 DIAGNOSIS — M545 Low back pain, unspecified: Secondary | ICD-10-CM

## 2018-09-26 DIAGNOSIS — S46012A Strain of muscle(s) and tendon(s) of the rotator cuff of left shoulder, initial encounter: Secondary | ICD-10-CM | POA: Diagnosis not present

## 2018-10-11 DIAGNOSIS — M25512 Pain in left shoulder: Secondary | ICD-10-CM | POA: Diagnosis not present

## 2018-10-11 DIAGNOSIS — S46012A Strain of muscle(s) and tendon(s) of the rotator cuff of left shoulder, initial encounter: Secondary | ICD-10-CM | POA: Diagnosis not present

## 2018-10-11 DIAGNOSIS — M6281 Muscle weakness (generalized): Secondary | ICD-10-CM | POA: Diagnosis not present

## 2018-10-14 ENCOUNTER — Other Ambulatory Visit: Payer: Self-pay | Admitting: Family Medicine

## 2018-10-14 DIAGNOSIS — J309 Allergic rhinitis, unspecified: Secondary | ICD-10-CM

## 2018-10-16 DIAGNOSIS — M6281 Muscle weakness (generalized): Secondary | ICD-10-CM | POA: Diagnosis not present

## 2018-10-16 DIAGNOSIS — M25512 Pain in left shoulder: Secondary | ICD-10-CM | POA: Diagnosis not present

## 2018-10-19 DIAGNOSIS — M6281 Muscle weakness (generalized): Secondary | ICD-10-CM | POA: Diagnosis not present

## 2018-10-19 DIAGNOSIS — M25512 Pain in left shoulder: Secondary | ICD-10-CM | POA: Diagnosis not present

## 2018-10-23 DIAGNOSIS — M25512 Pain in left shoulder: Secondary | ICD-10-CM | POA: Diagnosis not present

## 2018-10-23 DIAGNOSIS — M6281 Muscle weakness (generalized): Secondary | ICD-10-CM | POA: Diagnosis not present

## 2018-10-26 DIAGNOSIS — M6281 Muscle weakness (generalized): Secondary | ICD-10-CM | POA: Diagnosis not present

## 2018-10-26 DIAGNOSIS — M25512 Pain in left shoulder: Secondary | ICD-10-CM | POA: Diagnosis not present

## 2018-11-02 DIAGNOSIS — M25512 Pain in left shoulder: Secondary | ICD-10-CM | POA: Diagnosis not present

## 2018-11-02 DIAGNOSIS — M6281 Muscle weakness (generalized): Secondary | ICD-10-CM | POA: Diagnosis not present

## 2018-11-03 ENCOUNTER — Ambulatory Visit: Payer: Medicare Other | Admitting: Family Medicine

## 2018-11-03 DIAGNOSIS — Z0289 Encounter for other administrative examinations: Secondary | ICD-10-CM

## 2018-11-13 ENCOUNTER — Encounter: Payer: Self-pay | Admitting: Family Medicine

## 2018-11-13 ENCOUNTER — Other Ambulatory Visit: Payer: Self-pay | Admitting: Family Medicine

## 2018-11-13 ENCOUNTER — Ambulatory Visit (INDEPENDENT_AMBULATORY_CARE_PROVIDER_SITE_OTHER): Payer: PPO | Admitting: Family Medicine

## 2018-11-13 VITALS — BP 120/60 | HR 51 | Temp 97.7°F | Ht 61.5 in | Wt 158.4 lb

## 2018-11-13 DIAGNOSIS — I10 Essential (primary) hypertension: Secondary | ICD-10-CM | POA: Diagnosis not present

## 2018-11-13 DIAGNOSIS — F419 Anxiety disorder, unspecified: Secondary | ICD-10-CM | POA: Diagnosis not present

## 2018-11-13 DIAGNOSIS — M810 Age-related osteoporosis without current pathological fracture: Secondary | ICD-10-CM | POA: Diagnosis not present

## 2018-11-13 DIAGNOSIS — R1310 Dysphagia, unspecified: Secondary | ICD-10-CM | POA: Diagnosis not present

## 2018-11-13 DIAGNOSIS — M25512 Pain in left shoulder: Secondary | ICD-10-CM | POA: Insufficient documentation

## 2018-11-13 DIAGNOSIS — E663 Overweight: Secondary | ICD-10-CM | POA: Diagnosis not present

## 2018-11-13 DIAGNOSIS — N179 Acute kidney failure, unspecified: Secondary | ICD-10-CM

## 2018-11-13 LAB — COMPREHENSIVE METABOLIC PANEL
ALT: 23 U/L (ref 0–35)
AST: 28 U/L (ref 0–37)
Albumin: 4.1 g/dL (ref 3.5–5.2)
Alkaline Phosphatase: 53 U/L (ref 39–117)
BUN: 27 mg/dL — ABNORMAL HIGH (ref 6–23)
CO2: 29 mEq/L (ref 19–32)
Calcium: 9.6 mg/dL (ref 8.4–10.5)
Chloride: 100 mEq/L (ref 96–112)
Creatinine, Ser: 1.22 mg/dL — ABNORMAL HIGH (ref 0.40–1.20)
GFR: 45.07 mL/min — ABNORMAL LOW (ref >60.00)
Glucose, Bld: 97 mg/dL (ref 70–99)
Potassium: 4.7 mEq/L (ref 3.5–5.1)
Sodium: 136 mEq/L (ref 135–145)
Total Bilirubin: 0.6 mg/dL (ref 0.2–1.2)
Total Protein: 7.2 g/dL (ref 6.0–8.3)

## 2018-11-13 NOTE — Assessment & Plan Note (Signed)
Asymptomatic with omeprazole.  She will continue this.

## 2018-11-13 NOTE — Assessment & Plan Note (Signed)
She will continue weight watchers and stay active.

## 2018-11-13 NOTE — Assessment & Plan Note (Signed)
Possible rotator cuff impingement.  Could also be bursitis.  She will see orthopedics next week as planned.

## 2018-11-13 NOTE — Assessment & Plan Note (Signed)
Well-controlled.  Continue current regimen.  Check CMP. 

## 2018-11-13 NOTE — Patient Instructions (Signed)
Nice to see you. We will check lab work today and contact you with results. Please continue your current blood pressure medications. Please continue to work on diet and exercise. Please see orthopedics as planned.

## 2018-11-13 NOTE — Progress Notes (Signed)
Tommi Rumps, MD Phone: [phone]  Morgan Byrd is a 83 y.o. female who presents today for follow-up.  CC: Hypertension, GERD, anxiety, left shoulder pain, overweight  Hypertension: Typically 120s/60s.  Taking HCTZ, losartan, metoprolol.  No chest pain, shortness of breath, or edema.  GERD: Taking omeprazole.  No reflux, abdominal pain, blood in her stool, or dysphagia.  Anxiety: She notes no anxiety or depression.  She has not taken Xanax in a long time.  Left shoulder pain: She has been seeing orthopedics.  She notes her left shoulder hurts if she tries to reach across and touch her right shoulder.  She notes they feel it is possibly related to a strain.  She did physical therapy and that was not beneficial.  She sees orthopedics next Tuesday for follow-up.  Overweight: Patient has lost weight recently.  She has been working on weight loss.  She has been doing weight watchers.  She does walk her dog for exercise.  Social History   Tobacco Use  Smoking Status Never Smoker  Smokeless Tobacco Never Used     ROS see history of present illness  Objective  Physical Exam Vitals:   11/13/18 1017  BP: 120/60  Pulse: (!) 51  Temp: 97.7 F (36.5 C)  SpO2: 97%    BP Readings from Last 3 Encounters:  11/13/18 120/60  09/11/18 134/60  07/20/18 136/72   Wt Readings from Last 3 Encounters:  11/13/18 158 lb 6.4 oz (71.8 kg)  09/11/18 162 lb (73.5 kg)  07/20/18 169 lb 12.8 oz (77 kg)    Physical Exam Constitutional:      General: She is not in acute distress.    Appearance: She is not diaphoretic.  Cardiovascular:     Rate and Rhythm: Normal rate and regular rhythm.     Heart sounds: Normal heart sounds.  Pulmonary:     Effort: Pulmonary effort is normal.     Breath sounds: Normal breath sounds.  Musculoskeletal:     Right lower leg: No edema.     Left lower leg: No edema.     Comments: Bilateral shoulders symmetric, no tenderness of either shoulder, there is  discomfort on internal active range of motion and abduction of the left shoulder, no other discomfort on other active or passive ranges of motion in the left or right shoulder, positive empty can on the left, negative empty can on the right, negative speeds bilaterally  Skin:    General: Skin is warm and dry.  Neurological:     Mental Status: She is alert.      Assessment/Plan: Please see individual problem list.  Hypertension Well-controlled.  Continue current regimen.  Check CMP.  Dysphagia Asymptomatic with omeprazole.  She will continue this.  Anxiety Asymptomatic.  Monitor for recurrence.  Osteoporosis DEXA scan ordered.  Left shoulder pain Possible rotator cuff impingement.  Could also be bursitis.  She will see orthopedics next week as planned.  Overweight She will continue weight watchers and stay active.    Orders Placed This Encounter  Procedures  . DG Bone Density    Standing Status:   Future    Standing Expiration Date:   01/12/2020    Order Specific Question:   Reason for Exam (SYMPTOM  OR DIAGNOSIS REQUIRED)    Answer:   Postmenopausal estrogen deficiency, osteoporosis follow-up, not on treatment currently    Order Specific Question:   Preferred imaging location?    Answer:   [facility]  . Comp Met (CMET)  No orders of the defined types were placed in this encounter.    Tommi Rumps, MD [place]

## 2018-11-13 NOTE — Assessment & Plan Note (Signed)
Asymptomatic.  Monitor for recurrence. 

## 2018-11-13 NOTE — Assessment & Plan Note (Signed)
-   DEXA scan ordered

## 2018-11-14 ENCOUNTER — Encounter

## 2018-11-14 ENCOUNTER — Encounter (INDEPENDENT_AMBULATORY_CARE_PROVIDER_SITE_OTHER): Payer: Medicare Other

## 2018-11-14 ENCOUNTER — Ambulatory Visit (INDEPENDENT_AMBULATORY_CARE_PROVIDER_SITE_OTHER): Payer: Medicare Other | Admitting: Vascular Surgery

## 2018-11-21 DIAGNOSIS — M25512 Pain in left shoulder: Secondary | ICD-10-CM | POA: Diagnosis not present

## 2018-11-23 ENCOUNTER — Other Ambulatory Visit (INDEPENDENT_AMBULATORY_CARE_PROVIDER_SITE_OTHER): Payer: PPO

## 2018-11-23 DIAGNOSIS — N179 Acute kidney failure, unspecified: Secondary | ICD-10-CM | POA: Diagnosis not present

## 2018-11-23 LAB — BASIC METABOLIC PANEL
BUN: 20 mg/dL (ref 6–23)
CO2: 31 mEq/L (ref 19–32)
Calcium: 9.7 mg/dL (ref 8.4–10.5)
Chloride: 98 mEq/L (ref 96–112)
Creatinine, Ser: 0.84 mg/dL (ref 0.40–1.20)
GFR: 65.22 mL/min (ref >60.00)
Glucose, Bld: 103 mg/dL — ABNORMAL HIGH (ref 70–99)
Potassium: 4.6 mEq/L (ref 3.5–5.1)
Sodium: 136 mEq/L (ref 135–145)

## 2018-11-28 ENCOUNTER — Telehealth: Payer: Self-pay

## 2018-11-28 ENCOUNTER — Other Ambulatory Visit: Payer: Self-pay | Admitting: Family Medicine

## 2018-11-28 NOTE — Telephone Encounter (Signed)
Copied from [place] [id number]. Topic: General - Inquiry >> Nov 28, 2018  9:25 AM Virl Axe D wrote: Reason for CRM: Pt stated that she has not heard anything regarding being scheduled for her bone density test. Please advise. CB# [phone]   I called & advised patient that she could call & schedule her bone density. I gave her number to [facility] & made sure she knew to schedule after 12/27/18 so it would be two years after the previous scan.

## 2018-12-04 DIAGNOSIS — M25512 Pain in left shoulder: Secondary | ICD-10-CM | POA: Diagnosis not present

## 2018-12-07 DIAGNOSIS — M75122 Complete rotator cuff tear or rupture of left shoulder, not specified as traumatic: Secondary | ICD-10-CM | POA: Diagnosis not present

## 2018-12-11 ENCOUNTER — Encounter: Payer: Self-pay | Admitting: Family Medicine

## 2018-12-14 DIAGNOSIS — B351 Tinea unguium: Secondary | ICD-10-CM | POA: Diagnosis not present

## 2018-12-14 DIAGNOSIS — M79675 Pain in left toe(s): Secondary | ICD-10-CM | POA: Diagnosis not present

## 2018-12-14 DIAGNOSIS — M2042 Other hammer toe(s) (acquired), left foot: Secondary | ICD-10-CM | POA: Diagnosis not present

## 2018-12-14 DIAGNOSIS — M79674 Pain in right toe(s): Secondary | ICD-10-CM | POA: Diagnosis not present

## 2018-12-14 DIAGNOSIS — M2041 Other hammer toe(s) (acquired), right foot: Secondary | ICD-10-CM | POA: Diagnosis not present

## 2018-12-26 ENCOUNTER — Other Ambulatory Visit: Payer: Self-pay | Admitting: Family Medicine

## 2019-01-02 DIAGNOSIS — M75120 Complete rotator cuff tear or rupture of unspecified shoulder, not specified as traumatic: Secondary | ICD-10-CM | POA: Insufficient documentation

## 2019-01-02 DIAGNOSIS — M75122 Complete rotator cuff tear or rupture of left shoulder, not specified as traumatic: Secondary | ICD-10-CM | POA: Diagnosis not present

## 2019-01-16 ENCOUNTER — Telehealth: Payer: Self-pay

## 2019-01-16 ENCOUNTER — Other Ambulatory Visit: Payer: Self-pay | Admitting: Family Medicine

## 2019-01-16 DIAGNOSIS — J309 Allergic rhinitis, unspecified: Secondary | ICD-10-CM

## 2019-01-16 NOTE — Telephone Encounter (Signed)
Sent to PCP pt will need an OV correct with surgical clearance form to fill out?

## 2019-01-16 NOTE — Telephone Encounter (Signed)
Copied from [place] [id number]. Topic: General - Other >> Jan 16, 2019  2:17 PM Wynetta Emery, [place] C wrote: Reason for CRM: pt called in to make provider aware that she is having surgery on Friday. Pt says that she was advised by [facility] that she need to have clearance for surgery. Pt says that they advised her that she need to have PCP contact them to make them aware that she's okay for surgery and that pt doesn't have anything that would keep her from having surgery.    [facility] phone: [phone] ext [place]

## 2019-01-16 NOTE — Telephone Encounter (Addendum)
She will need an office visit for surgical clearance.  I can see her on 01/17/2019 at 145.  I blocked this time slot.  Please get her scheduled.

## 2019-01-17 ENCOUNTER — Ambulatory Visit (INDEPENDENT_AMBULATORY_CARE_PROVIDER_SITE_OTHER): Payer: PPO | Admitting: Family Medicine

## 2019-01-17 ENCOUNTER — Other Ambulatory Visit: Payer: Self-pay

## 2019-01-17 ENCOUNTER — Encounter: Payer: Self-pay | Admitting: Family Medicine

## 2019-01-17 VITALS — BP 128/88 | HR 61 | Temp 98.2°F | Ht 61.5 in | Wt 161.6 lb

## 2019-01-17 DIAGNOSIS — Z01818 Encounter for other preprocedural examination: Secondary | ICD-10-CM

## 2019-01-17 LAB — BASIC METABOLIC PANEL
BUN: 24 mg/dL — ABNORMAL HIGH (ref 6–23)
CO2: 30 mEq/L (ref 19–32)
Calcium: 9.9 mg/dL (ref 8.4–10.5)
Chloride: 101 mEq/L (ref 96–112)
Creatinine, Ser: 0.83 mg/dL (ref 0.40–1.20)
GFR: 66.1 mL/min (ref >60.00)
Glucose, Bld: 94 mg/dL (ref 70–99)
Potassium: 4.4 mEq/L (ref 3.5–5.1)
Sodium: 139 mEq/L (ref 135–145)

## 2019-01-17 LAB — CBC
HCT: 39.5 % (ref 36.0–46.0)
Hemoglobin: 13.2 g/dL (ref 12.0–15.0)
MCHC: 33.4 g/dL (ref 30.0–36.0)
MCV: 96.1 fl (ref 78.0–100.0)
Platelets: 291 10*3/uL (ref 150.0–400.0)
RBC: 4.11 Mil/uL (ref 3.87–5.11)
RDW: 13 % (ref 11.5–15.5)
WBC: 7 10*3/uL (ref 4.0–10.5)

## 2019-01-17 NOTE — Telephone Encounter (Signed)
Called and spoke with patient. Pt advised and is aware that she has been scheduled to be seen today at 1:45 PM. Sent to [facility] to schedule pt since I am unable to do so.  Thanks

## 2019-01-17 NOTE — Telephone Encounter (Signed)
Patient scheduled.

## 2019-01-17 NOTE — Patient Instructions (Addendum)
Nice to see you. We will check labs today and take care of your surgical clearance.

## 2019-01-17 NOTE — Telephone Encounter (Signed)
Patient needs her clearance for surgery sent to [facility] at [employer] in [place],

## 2019-01-18 NOTE — Telephone Encounter (Signed)
Letter has been faxed to [facility] and confirm confirmation was faxed back to [place]. Letter placed up front for pick up and pt came to the office today to pick this up.

## 2019-01-18 NOTE — Telephone Encounter (Addendum)
I apologize for the delay in getting this completed.  This patient is low risk for her surgical procedure and may proceed with surgery.  I have created a letter stating this.  Please fax this to the surgeon's office and make available for the patient to pick up.  Please contact the surgeon's office and let them know this.

## 2019-01-18 NOTE — Progress Notes (Signed)
  Tommi Rumps, MD Phone: [phone]  Morgan Byrd is a 83 y.o. female who presents today for follow-up.  Left rotator cuff surgery: Patient presents for surgical clearance for right rotator cuff surgery.  The patient denies chest pain and breathlessness when climbing 2 flights of stairs at a normal speed.  She is able to complete housework with no dyspnea or chest pain.  She is able to complete at least 4 METS of activity.  No history of kidney disease, family history of anesthesia issues, history of heart attack, history of irregular heartbeat, history of stroke, history of seizures, history of thyroid disease, history of liver disease, history of heart failure, history of asthma or bronchitis, or history of diabetes.  She does have some issues with nausea with anesthesia.  Social History   Tobacco Use  Smoking Status Former Smoker  Smokeless Tobacco Never Used     ROS see history of present illness  Objective  Physical Exam Vitals:   01/17/19 1346  BP: 128/88  Pulse: 61  Temp: 98.2 F (36.8 C)  SpO2: 95%    BP Readings from Last 3 Encounters:  01/17/19 128/88  11/13/18 120/60  09/11/18 134/60   Wt Readings from Last 3 Encounters:  01/17/19 161 lb 9.6 oz (73.3 kg)  11/13/18 158 lb 6.4 oz (71.8 kg)  09/11/18 162 lb (73.5 kg)    Physical Exam Constitutional:      General: She is not in acute distress.    Appearance: She is not diaphoretic.  HENT:     Head: Normocephalic and atraumatic.     Mouth/Throat:     Mouth: Mucous membranes are moist.     Pharynx: Oropharynx is clear.  Cardiovascular:     Rate and Rhythm: Normal rate and regular rhythm.     Heart sounds: Normal heart sounds.  Pulmonary:     Effort: Pulmonary effort is normal.     Breath sounds: Normal breath sounds.  Abdominal:     General: Bowel sounds are normal. There is no distension.     Palpations: Abdomen is soft.     Tenderness: There is no abdominal tenderness. There is no guarding or  rebound.  Musculoskeletal:     Right lower leg: No edema.     Left lower leg: No edema.  Skin:    General: Skin is warm and dry.  Neurological:     Mental Status: She is alert.      Assessment/Plan: Please see individual problem list.  Pre-op evaluation Preoperative exam complete.  Lab work obtained.  Gupta perioperative risk score calculated.  Patient is low risk for cardiac complications.  Medical issues are well controlled.  She is low risk for surgery and may proceed.   Orders Placed This Encounter  Procedures  . CBC  . Basic Metabolic Panel (BMET)    No orders of the defined types were placed in this encounter.    Tommi Rumps, MD [place]

## 2019-01-18 NOTE — Telephone Encounter (Signed)
Did called the [facility] and was put on hold for a long period of time did refax the letter two more these just to make sure it does go through to them today.

## 2019-01-18 NOTE — Telephone Encounter (Signed)
Patient called to check on status of clearance and I advised patient per Butch Penny, that we relayed to Dr. Caryl Bis and his assistant that it needs to be completed today. Patient insists it was supposed to be done yesterday because her surgery is supposed to be tomorrow and says she will come to the practice instead of waiting for a call back. Advised patient Epic was down and we would be getting this completed as soon as possible but she was not happy with the information provided regarding getting the clearance completed.

## 2019-01-19 DIAGNOSIS — M65812 Other synovitis and tenosynovitis, left shoulder: Secondary | ICD-10-CM | POA: Diagnosis not present

## 2019-01-19 DIAGNOSIS — S43432D Superior glenoid labrum lesion of left shoulder, subsequent encounter: Secondary | ICD-10-CM | POA: Diagnosis not present

## 2019-01-19 DIAGNOSIS — M7522 Bicipital tendinitis, left shoulder: Secondary | ICD-10-CM | POA: Diagnosis not present

## 2019-01-19 DIAGNOSIS — G8918 Other acute postprocedural pain: Secondary | ICD-10-CM | POA: Diagnosis not present

## 2019-01-19 DIAGNOSIS — M19012 Primary osteoarthritis, left shoulder: Secondary | ICD-10-CM | POA: Diagnosis not present

## 2019-01-19 DIAGNOSIS — Z87891 Personal history of nicotine dependence: Secondary | ICD-10-CM | POA: Diagnosis not present

## 2019-01-19 DIAGNOSIS — M75122 Complete rotator cuff tear or rupture of left shoulder, not specified as traumatic: Secondary | ICD-10-CM | POA: Diagnosis not present

## 2019-01-19 DIAGNOSIS — S46212D Strain of muscle, fascia and tendon of other parts of biceps, left arm, subsequent encounter: Secondary | ICD-10-CM | POA: Diagnosis not present

## 2019-01-19 DIAGNOSIS — M7542 Impingement syndrome of left shoulder: Secondary | ICD-10-CM | POA: Diagnosis not present

## 2019-01-19 DIAGNOSIS — M7592 Shoulder lesion, unspecified, left shoulder: Secondary | ICD-10-CM | POA: Diagnosis not present

## 2019-01-19 DIAGNOSIS — M25512 Pain in left shoulder: Secondary | ICD-10-CM | POA: Diagnosis not present

## 2019-01-19 DIAGNOSIS — Z79899 Other long term (current) drug therapy: Secondary | ICD-10-CM | POA: Diagnosis not present

## 2019-01-19 DIAGNOSIS — Z885 Allergy status to narcotic agent status: Secondary | ICD-10-CM | POA: Diagnosis not present

## 2019-01-19 DIAGNOSIS — I10 Essential (primary) hypertension: Secondary | ICD-10-CM | POA: Diagnosis not present

## 2019-01-19 DIAGNOSIS — K219 Gastro-esophageal reflux disease without esophagitis: Secondary | ICD-10-CM | POA: Diagnosis not present

## 2019-01-21 DIAGNOSIS — Z01818 Encounter for other preprocedural examination: Secondary | ICD-10-CM | POA: Insufficient documentation

## 2019-01-21 NOTE — Assessment & Plan Note (Signed)
Preoperative exam complete.  Lab work obtained.  Gupta perioperative risk score calculated.  Patient is low risk for cardiac complications.  Medical issues are well controlled.  She is low risk for surgery and may proceed.

## 2019-01-23 ENCOUNTER — Other Ambulatory Visit: Payer: PPO

## 2019-01-24 ENCOUNTER — Other Ambulatory Visit: Payer: Self-pay | Admitting: Family Medicine

## 2019-01-24 ENCOUNTER — Other Ambulatory Visit: Payer: Self-pay

## 2019-01-24 NOTE — Patient Outreach (Signed)
[facility]) Care Management  01/24/2019  Morgan Byrd 1936/03/03 [id number]   Referral Date: 01/24/2019 Referral Source: Nurseline Referral Reason: Swelling to lower legs and feet   Outreach Attempt: spoke with patient.  She is able to verify HIPAA.  Discussed reason for the call.  She states that she elevated her legs last night and her legs are gone down.  Patient states that she has also increased her activity and water intake.  Discussed with patient if she has any further problems to contact her physician.  She verbalized understanding and appreciative of call. Patient denies any further needs at this time.     Plan: RN CM will close case.     Jone Baseman, RN, MSN [facility] Care Management Care Management Coordinator Direct Line [phone] Toll Free: [phone]  Fax: [phone]

## 2019-01-31 ENCOUNTER — Other Ambulatory Visit: Payer: Self-pay | Admitting: Family Medicine

## 2019-02-02 DIAGNOSIS — M25512 Pain in left shoulder: Secondary | ICD-10-CM | POA: Diagnosis not present

## 2019-02-02 DIAGNOSIS — M25612 Stiffness of left shoulder, not elsewhere classified: Secondary | ICD-10-CM | POA: Diagnosis not present

## 2019-02-07 DIAGNOSIS — M25612 Stiffness of left shoulder, not elsewhere classified: Secondary | ICD-10-CM | POA: Diagnosis not present

## 2019-02-07 DIAGNOSIS — M25512 Pain in left shoulder: Secondary | ICD-10-CM | POA: Diagnosis not present

## 2019-02-09 DIAGNOSIS — M25512 Pain in left shoulder: Secondary | ICD-10-CM | POA: Diagnosis not present

## 2019-02-09 DIAGNOSIS — M1611 Unilateral primary osteoarthritis, right hip: Secondary | ICD-10-CM | POA: Diagnosis not present

## 2019-02-09 DIAGNOSIS — G8929 Other chronic pain: Secondary | ICD-10-CM | POA: Diagnosis not present

## 2019-02-09 DIAGNOSIS — M25612 Stiffness of left shoulder, not elsewhere classified: Secondary | ICD-10-CM | POA: Diagnosis not present

## 2019-02-09 DIAGNOSIS — M25511 Pain in right shoulder: Secondary | ICD-10-CM | POA: Diagnosis not present

## 2019-02-12 ENCOUNTER — Telehealth: Payer: Self-pay

## 2019-02-12 ENCOUNTER — Ambulatory Visit: Payer: Self-pay | Admitting: *Deleted

## 2019-02-12 ENCOUNTER — Encounter: Payer: Self-pay | Admitting: Family Medicine

## 2019-02-12 ENCOUNTER — Other Ambulatory Visit: Payer: Self-pay

## 2019-02-12 ENCOUNTER — Ambulatory Visit (INDEPENDENT_AMBULATORY_CARE_PROVIDER_SITE_OTHER): Payer: PPO | Admitting: Family Medicine

## 2019-02-12 DIAGNOSIS — J989 Respiratory disorder, unspecified: Secondary | ICD-10-CM

## 2019-02-12 MED ORDER — OSELTAMIVIR PHOSPHATE 75 MG PO CAPS: 75.0000 mg | ORAL_CAPSULE | Freq: Two times a day (BID) | ORAL | 0 refills | Status: DC

## 2019-02-12 NOTE — Telephone Encounter (Signed)
Pt has been scheduled for a phone visit with PCP today

## 2019-02-12 NOTE — Telephone Encounter (Signed)
Pt has been scheduled fo phone visit with PCP today

## 2019-02-12 NOTE — Telephone Encounter (Signed)
Copied from [place] [id number]. Topic: Appointment Scheduling - Scheduling Inquiry for Clinic >> Feb 12, 2019  8:22 AM Alanda Slim E wrote: Reason for CRM: Pt would like to be seen. Pt a fever of 100.4 and cough with sore throat / attempted to contact office 3 time with no answer/ please advise

## 2019-02-12 NOTE — Telephone Encounter (Signed)
Patient is not feeling well- chills, congestion, BP low and high, fever- 100.1 this morning- using tylenol. At highest reading- 101- tylenol does seem to be bringing her temperature down. Patient states she is not having cough or respiratory symptoms.  Patient encouraged to increase fluids and continue her tylenol. Patient to records her BP readings for provider- they ranges from- 112/40 to 170/90 at times. Patient contacts for virtual visit are: [email or web address] and [phone]  Reason for Disposition . Fever present > 3 days (72 hours)  Answer Assessment - Initial Assessment Questions 1. TEMPERATURE: "What is the most recent temperature?"  "How was it measured?"      100.1- ear thermometer 2. ONSET: "When did the fever start?"      Saturday evening 3. SYMPTOMS: "Do you have any other symptoms besides the fever?"  (e.g., colds, headache, sore throat, earache, cough, rash, diarrhea, vomiting, abdominal pain)     Sinus Congestion, chills 4. CAUSE: If there are no symptoms, ask: "What do you think is causing the fever?"      n/a 5. CONTACTS: "Does anyone else in the family have an infection?"     No contact- 3/13 surgery- patient has been doing surgical rehab 4/3 last visit 6. TREATMENT: "What have you done so far to treat this fever?" (e.g., medications)     Tylenol, patient was treated for sinus infection after surgery  7. IMMUNOCOMPROMISE: "Do you have of the following: diabetes, HIV positive, splenectomy, cancer chemotherapy, chronic steroid treatment, transplant patient, etc."     Post surgical only 8. PREGNANCY: "Is there any chance you are pregnant?" "When was your last menstrual period?"     n/a 9. TRAVEL: "Have you traveled out of the country in the last month?" (e.g., travel history, exposures)     No travel,no known exposure  Protocols used: FEVER-A-AH

## 2019-02-12 NOTE — Telephone Encounter (Signed)
Please contact the patient and get her set up for a WebEx visit.  If she cannot do a WebEx visit we can do a phone visit.  We should try to do this today, though if she cannot do it today you could schedule her with Ander Purpura tomorrow.  She needs to self isolate at home as well and not go to any additional rehab visits.

## 2019-02-12 NOTE — Progress Notes (Signed)
Virtual Visit via Telephone Note  This visit type was conducted due to national recommendations for restrictions regarding the COVID-19 pandemic (e.g. social distancing).  This format is felt to be most appropriate for this patient at this time.  All issues noted in this document were discussed and addressed.  No physical exam was performed (except for noted visual exam findings with Video Visits).   I connected with Morgan Byrd on 02/12/19 at  1:15 PM EDT by telephone and verified that I am speaking with the correct person using two identifiers.   I discussed the limitations, risks, security and privacy concerns of performing an evaluation and management service by telephone and the availability of in person appointments. I also discussed with the patient that there may be a patient responsible charge related to this service. The patient expressed understanding and agreed to proceed.  The patient Morgan Byrd) was at home and I Tommi Rumps) was in the office. We were the participants in this visit.   Interactive audio and video telecommunications were attempted between this provider and patient, however failed, due to patient having technical difficulties OR patient did not have access to video capability.  We continued and completed visit with audio only.   Reason for visit: URI symptoms  History of Present Illness: Patient reports onset of symptoms Saturday evening at 7 PM.  Patient notes these came on suddenly.  Symptoms started with cold shaking.  She had a 55F temperature at that time.  She continued to have symptoms the next day and developed postnasal drip with sinus congestion and minimal cough productive of mucus.  She notes no shortness of breath.  She notes no sore throat.  She had a T-max of 75 F which came down with Tylenol.  She has continued Tylenol and her temperature came down today to 97.3 F.  She notes no COVID-19 exposure.  No travel.  She did have shoulder surgery  and notes that is doing well with no issues.  She is canceled her therapy for this week though was doing therapy last week.  She was treated with for a sinus infection in the middle of March with Augmentin and notes those symptoms resolved for about 1.5 weeks.  She does note her son is on the kidney transplant list and was at her house on Saturday and notes he is worried given his exposure to her.  She did get a flu shot.  She does take her allergy medications.    Observations/Objective: No physical exam was completed given that this was a telehealth visit.  Assessment and Plan: Respiratory illness Patient presents with upper respiratory illness with fever.  This could represent a common cold, influenza, or COVID-19.  I discussed this with the patient.  Given sudden onset and symptoms we will treat her empirically for influenza.  I did discuss strict self-isolation precautions and advised her and her husband to follow these for the next 14 days or until she has been 3 days without fever without use of antipyretics and with improvement of symptoms and as long as it has been greater than 7 days since her symptoms started.  I did discuss that if her son develops any symptoms he needs to contact his physician.  She is given reasons to seek medical attention in the emergency department.  She was given reasons to contact us.    Follow Up Instructions: 2-day phone call follow-up by CMA to check on how the patient is doing.   I  discussed the assessment and treatment plan with the patient. The patient was provided an opportunity to ask questions and all were answered. The patient agreed with the plan and demonstrated an understanding of the instructions.   The patient was advised to call back or seek an in-person evaluation if the symptoms worsen or if the condition fails to improve as anticipated.  I provided 20 minutes of non-face-to-face time during this encounter.   Tommi Rumps, MD

## 2019-02-13 ENCOUNTER — Telehealth: Payer: Self-pay | Admitting: Family Medicine

## 2019-02-13 DIAGNOSIS — J989 Respiratory disorder, unspecified: Secondary | ICD-10-CM | POA: Insufficient documentation

## 2019-02-13 NOTE — Telephone Encounter (Signed)
Please call the patient on Wednesday and see how she is doing with her respiratory illness.  Thanks.

## 2019-02-13 NOTE — Assessment & Plan Note (Signed)
Patient presents with upper respiratory illness with fever.  This could represent a common cold, influenza, or COVID-19.  I discussed this with the patient.  Given sudden onset and symptoms we will treat her empirically for influenza.  I did discuss strict self-isolation precautions and advised her and her husband to follow these for the next 14 days or until she has been 3 days without fever without use of antipyretics and with improvement of symptoms and as long as it has been greater than 7 days since her symptoms started.  I did discuss that if her son develops any symptoms he needs to contact his physician.  She is given reasons to seek medical attention in the emergency department.  She was given reasons to contact us.

## 2019-02-14 NOTE — Telephone Encounter (Signed)
Called and spoke with patient. Pt is still c/o having cold chills and coughing up green mucus, and having to blow her nose a lot, BP has been low a few times last night at 8:45 PM it was 102/48. Pt is still having to take tylenol is keeping fever down until about 1/2 an hour before the pill starts to wear off and she has to take another one. Pt stated that the cold chills are the worse part.   PCP would like pt to check he BP and let us know what it reads today if it is still low she needs to go to urgent care.

## 2019-02-14 NOTE — Telephone Encounter (Signed)
Called and spoke with pt. Pt advised and scheduled for an appt 02/21/2019 will set up for doxyme.

## 2019-02-14 NOTE — Telephone Encounter (Signed)
Noted. I am glad she is feeling somewhat better. It may take several more days for her to continue to improve. The tamiflu helps to decrease the duration of symptoms in patients with influenza. If her symptoms are related to influenza this will decrease the duration of symptoms by about 1 day though will not make them improve suddenly and it may take several more days for her to improve further. If the symptoms are related to coronavirus she could continue to have symptoms for a while longer. She can continue the tylenol over the counter. She can continue to use the flonase and zyrtec for symptoms. She should decrease her losartan dose to 50 mg daily. This will be half a tablet of her current dose. She should monitor her BP and if let us know what it is running tomorrow. We should do a phone follow-up with her early next week to see how she is progressing. Thanks.

## 2019-02-14 NOTE — Telephone Encounter (Signed)
BP 12:23 PM 102/46  BP 1:01 PM 109/52   BP 1:30 PM 124/60   Took BP medication at 11:00 AM today   Pt stated that the left side of her nasal cavity is stuffy and has a runny nose, still coughing up discolored mucus, cold chills have seem to have gotten somewhat better compare to what it was on Saturday. Pt wanted to know would the Tanaful help her to get rid of this congestion.

## 2019-02-14 NOTE — Telephone Encounter (Signed)
Called and spoke with pt pt will check BP and call us back with her reading today.

## 2019-02-15 ENCOUNTER — Telehealth: Payer: Self-pay | Admitting: Family Medicine

## 2019-02-15 DIAGNOSIS — J069 Acute upper respiratory infection, unspecified: Secondary | ICD-10-CM | POA: Diagnosis not present

## 2019-02-15 DIAGNOSIS — J00 Acute nasopharyngitis [common cold]: Secondary | ICD-10-CM | POA: Diagnosis not present

## 2019-02-15 DIAGNOSIS — R05 Cough: Secondary | ICD-10-CM | POA: Diagnosis not present

## 2019-02-15 DIAGNOSIS — J0100 Acute maxillary sinusitis, unspecified: Secondary | ICD-10-CM | POA: Diagnosis not present

## 2019-02-15 NOTE — Telephone Encounter (Signed)
Given her continued symptoms and intermittently low BPs I would advise the she be evaluated at urgent care to determine if she needs a CXR and lab work. At this time she needs to be seen in person for an evaluation and urgent care is the best place for her to go. Given that her BP this morning is improved I do not think she needs to go to the ED. She should wear a mask to urgent care and she should call ahead to let them know her symptoms so they can advise her on the protocol for arrival.

## 2019-02-15 NOTE — Telephone Encounter (Signed)
Patient called [facility] line wanted make PCP aware her BP 103/42 and that during the night she rechecked improved to 121/54 this am 143/65, she is remaining afebrile as long as she is taking tylenol every 6 - 8 hours, but has really bad cold chills, and she has a lot of green to yellow colored mucus draining from sinuses stated at times it is really bad she has to cough it up. Advised patient she should try to drink plenty of fluids, and patient stated she has had recent shoulder surgery and makes it hard for her to get up and down to attain fluids. Patient denies shortness of breath and is speaking in full sentences without pause. Patient main concern with the chills she is almost shaking and quivering an in sweats at the same time is she has an infection.

## 2019-02-15 NOTE — Telephone Encounter (Signed)
Patient stated she will go to [facility] [place] number given to call ahead and patient has mask will wear.

## 2019-02-16 ENCOUNTER — Ambulatory Visit: Payer: Medicare Other

## 2019-02-19 ENCOUNTER — Ambulatory Visit: Payer: PPO

## 2019-02-21 ENCOUNTER — Other Ambulatory Visit: Payer: Self-pay

## 2019-02-21 ENCOUNTER — Telehealth: Payer: Self-pay | Admitting: Family Medicine

## 2019-02-21 ENCOUNTER — Encounter: Payer: Self-pay | Admitting: Family Medicine

## 2019-02-21 ENCOUNTER — Ambulatory Visit (INDEPENDENT_AMBULATORY_CARE_PROVIDER_SITE_OTHER): Payer: PPO | Admitting: Family Medicine

## 2019-02-21 DIAGNOSIS — J189 Pneumonia, unspecified organism: Secondary | ICD-10-CM

## 2019-02-21 DIAGNOSIS — J181 Lobar pneumonia, unspecified organism: Secondary | ICD-10-CM | POA: Diagnosis not present

## 2019-02-21 DIAGNOSIS — I10 Essential (primary) hypertension: Secondary | ICD-10-CM

## 2019-02-21 DIAGNOSIS — R600 Localized edema: Secondary | ICD-10-CM | POA: Insufficient documentation

## 2019-02-21 DIAGNOSIS — J989 Respiratory disorder, unspecified: Secondary | ICD-10-CM | POA: Diagnosis not present

## 2019-02-21 NOTE — Progress Notes (Addendum)
Virtual Visit via Video Note  This visit type was conducted due to national recommendations for restrictions regarding the COVID-19 pandemic (e.g. social distancing).  This format is felt to be most appropriate for this patient at this time.  All issues noted in this document were discussed and addressed.  No physical exam was performed (except for noted visual exam findings with Video Visits).   I connected with Morgan Byrd on 02/21/19 at 10:00 AM EDT by a video enabled telemedicine application and verified that I am speaking with the correct person using two identifiers. Location patient: home Location provider: work Persons participating in the virtual visit: patient, provider, Pension scheme manager  I discussed the limitations, risks, security and privacy concerns of performing an evaluation and management service by telephone and the availability of in person appointments. I also discussed with the patient that there may be a patient responsible charge related to this service. The patient expressed understanding and agreed to proceed.  Reason for visit: follow-up  HPI: Respiratory illness: Patient was evaluated at urgent care.  She reports she had a negative flu test.  They did COVID-19 testing which she reports was negative.  She did a chest x-ray and noted that they advised her that there might have been something at the bottom of her right lung consistent with a pneumonia.  They placed her on doxycycline and she has significantly improved.  She is noted no fevers.  She has not taken any Tylenol in the last 4 days.  She does note some loose stools with the doxycycline.  She notes no blood or abdominal pain with this.  She does note she is checked her temperature with an ear thermometer though it was 95.5 F.  She had her husband check his temperature and his was the same.  Hypertension: Blood pressures been running in the 140s over 60s to 70s.  She is taking HCTZ, metoprolol, and half her dose of  losartan.  She notes no chest pain or shortness of breath.    Edema: She does note some bilateral leg edema that is been going on for 5 weeks since her surgery.  Does note it goes down overnight.  She notes no orthopnea or PND.  Recent CBC and renal function acceptable.   ROS: See pertinent positives and negatives per HPI.  Past Medical History:  Diagnosis Date  . Anxiety   . Hypertension     Past Surgical History:  Procedure Laterality Date  . ABDOMINAL HYSTERECTOMY    . BLADDER SUSPENSION    . BREAST BIOPSY Right 1978   neg  . COLONOSCOPY WITH PROPOFOL N/A 12/26/2015   Procedure: COLONOSCOPY WITH PROPOFOL;  Surgeon: Manya Silvas, MD;  Location: [facility] ENDOSCOPY;  Service: Endoscopy;  Laterality: N/A;  . REPLACEMENT TOTAL KNEE BILATERAL    . SHOULDER SURGERY    . VAGINAL DELIVERY     2    Family History  Problem Relation Age of Onset  . Dementia Mother   . Arthritis Mother   . Heart disease Father   . Hypertension Father   . Cancer Father        prostate  . Diabetes Father   . Diabetes Sister   . Cancer Sister        breast  . Breast cancer Sister 93  . Heart disease Sister   . Vasculitis Son   . Kidney disease Son   . Pneumonia Son   . Breast cancer Cousin 28    SOCIAL HX:  Former smoker   Current Outpatient Medications:  .  ALLERGY RELIEF 180 MG tablet, TAKE ONE TABLET EVERY DAY, Disp: 90 tablet, Rfl: 1 .  ALPRAZolam (XANAX) 0.25 MG tablet, Take 1 tablet (0.25 mg total) by mouth 3 (three) times daily as needed for anxiety., Disp: 20 tablet, Rfl: 0 .  aspirin 81 MG EC tablet, Take 81 mg by mouth daily. Swallow whole., Disp: , Rfl:  .  B Complex-C (SUPER B COMPLEX PO), Take by mouth., Disp: , Rfl:  .  CALCIUM-MAGNESIUM-ZINC PO, Take by mouth., Disp: , Rfl:  .  cetirizine (ZYRTEC) 10 MG tablet, Take 10 mg by mouth daily., Disp: , Rfl:  .  cholecalciferol (VITAMIN D) 1000 UNITS tablet, Take 1,000 Units by mouth daily., Disp: , Rfl:  .  doxycycline (VIBRAMYCIN)  100 MG capsule, , Disp: , Rfl:  .  fluticasone (FLONASE) 50 MCG/ACT nasal spray, Place 2 sprays into both nostrils daily., Disp: 16 g, Rfl: 6 .  hydrochlorothiazide (MICROZIDE) 12.5 MG capsule, TAKE 1 CAPSULE EVERY DAY, Disp: 90 capsule, Rfl: 3 .  losartan (COZAAR) 100 MG tablet, TAKE 1 TABLET DAILY, Disp: 90 tablet, Rfl: 3 .  metoprolol tartrate (LOPRESSOR) 50 MG tablet, TAKE 1 TABLET BY MOUTH TWICE DAILY, Disp: 180 tablet, Rfl: 1 .  montelukast (SINGULAIR) 10 MG tablet, TAKE ONE TABLET AT BEDTIME, Disp: 90 tablet, Rfl: 1 .  Multiple Vitamin (MULTIVITAMIN) capsule, Take 1 capsule by mouth daily., Disp: , Rfl:  .  omeprazole (PRILOSEC) 20 MG capsule, TAKE 1 CAPSULE EVERY DAY, Disp: 30 capsule, Rfl: 1  EXAM:  VITALS per patient if applicable: None.  GENERAL: alert, oriented, appears well and in no acute distress  HEENT: atraumatic, conjunctiva clear, no obvious abnormalities on inspection of external nose and ears  NECK: normal movements of the head and neck  LUNGS: on inspection no signs of respiratory distress, breathing rate appears normal, no obvious gross SOB, gasping or wheezing  CV: no obvious cyanosis  MS: moves all visible extremities without noticeable abnormality  PSYCH/NEURO: pleasant and cooperative, no obvious depression or anxiety, speech and thought processing grossly intact  ASSESSMENT AND PLAN:  Discussed the following assessment and plan:  Pneumonia of right lower lobe due to infectious organism ([place]) - Plan: DG Chest 2 View  Essential hypertension  Respiratory illness  Bilateral lower extremity edema - Plan: TSH, Hepatic function panel  Hypertension Above goal.  She will increase her losartan back to her prior dose.  She will monitor her blood pressure and if it drops low she will contact us.  If her systolic blood pressures do not come down to less than 140 she will let us know.  Respiratory illness Sounds to have had a pneumonia.  She will complete her  course of doxycycline.  I discussed her starting on a probiotic and monitoring her loose stools.  If they worsen or she develops blood in her stool or abdominal pain she will contact us or be evaluated immediately.  We will have her return to the office in 3 to 4 weeks for a follow-up chest x-ray.  Bilateral lower extremity edema Undetermined cause.  Could be venous insufficiency.  No CHF symptoms.  We will check a hepatic function panel and TSH.    I discussed the assessment and treatment plan with the patient. The patient was provided an opportunity to ask questions and all were answered. The patient agreed with the plan and demonstrated an understanding of the instructions.   The patient was  advised to call back or seek an in-person evaluation if the symptoms worsen or if the condition fails to improve as anticipated.   Tommi Rumps, MD

## 2019-02-21 NOTE — Assessment & Plan Note (Signed)
Above goal.  She will increase her losartan back to her prior dose.  She will monitor her blood pressure and if it drops low she will contact us.  If her systolic blood pressures do not come down to less than 140 she will let us know.

## 2019-02-21 NOTE — Telephone Encounter (Signed)
Please contact the patient and get her set up for lab work and a chest x-ray in 3 to 4 weeks.  She needs follow-up in the office in 5 months.  Thanks.

## 2019-02-21 NOTE — Assessment & Plan Note (Signed)
Sounds to have had a pneumonia.  She will complete her course of doxycycline.  I discussed her starting on a probiotic and monitoring her loose stools.  If they worsen or she develops blood in her stool or abdominal pain she will contact us or be evaluated immediately.  We will have her return to the office in 3 to 4 weeks for a follow-up chest x-ray.

## 2019-02-21 NOTE — Assessment & Plan Note (Signed)
Undetermined cause.  Could be venous insufficiency.  No CHF symptoms.  We will check a hepatic function panel and TSH.

## 2019-02-21 NOTE — Telephone Encounter (Signed)
Called and spoke with pt. Pt has been scheduled for lab work and 5 month follow up appt.

## 2019-03-01 DIAGNOSIS — M25512 Pain in left shoulder: Secondary | ICD-10-CM | POA: Diagnosis not present

## 2019-03-01 DIAGNOSIS — M25612 Stiffness of left shoulder, not elsewhere classified: Secondary | ICD-10-CM | POA: Diagnosis not present

## 2019-03-05 DIAGNOSIS — M25612 Stiffness of left shoulder, not elsewhere classified: Secondary | ICD-10-CM | POA: Diagnosis not present

## 2019-03-05 DIAGNOSIS — M25512 Pain in left shoulder: Secondary | ICD-10-CM | POA: Diagnosis not present

## 2019-03-07 DIAGNOSIS — M25612 Stiffness of left shoulder, not elsewhere classified: Secondary | ICD-10-CM | POA: Diagnosis not present

## 2019-03-07 DIAGNOSIS — M25512 Pain in left shoulder: Secondary | ICD-10-CM | POA: Diagnosis not present

## 2019-03-12 ENCOUNTER — Ambulatory Visit (INDEPENDENT_AMBULATORY_CARE_PROVIDER_SITE_OTHER): Payer: PPO

## 2019-03-12 ENCOUNTER — Other Ambulatory Visit: Payer: Self-pay

## 2019-03-12 ENCOUNTER — Other Ambulatory Visit (INDEPENDENT_AMBULATORY_CARE_PROVIDER_SITE_OTHER): Payer: PPO

## 2019-03-12 DIAGNOSIS — R600 Localized edema: Secondary | ICD-10-CM

## 2019-03-12 DIAGNOSIS — J189 Pneumonia, unspecified organism: Secondary | ICD-10-CM

## 2019-03-12 DIAGNOSIS — J181 Lobar pneumonia, unspecified organism: Secondary | ICD-10-CM | POA: Diagnosis not present

## 2019-03-12 LAB — HEPATIC FUNCTION PANEL
ALT: 28 U/L (ref 0–35)
AST: 33 U/L (ref 0–37)
Albumin: 4 g/dL (ref 3.5–5.2)
Alkaline Phosphatase: 74 U/L (ref 39–117)
Bilirubin, Direct: 0.1 mg/dL (ref 0.0–0.3)
Total Bilirubin: 0.6 mg/dL (ref 0.2–1.2)
Total Protein: 7.3 g/dL (ref 6.0–8.3)

## 2019-03-12 LAB — TSH: TSH: 0.05 u[IU]/mL — ABNORMAL LOW (ref 0.35–4.50)

## 2019-03-14 DIAGNOSIS — M25512 Pain in left shoulder: Secondary | ICD-10-CM | POA: Diagnosis not present

## 2019-03-14 DIAGNOSIS — M25612 Stiffness of left shoulder, not elsewhere classified: Secondary | ICD-10-CM | POA: Diagnosis not present

## 2019-03-15 ENCOUNTER — Telehealth: Payer: Self-pay

## 2019-03-15 DIAGNOSIS — R911 Solitary pulmonary nodule: Secondary | ICD-10-CM

## 2019-03-15 NOTE — Telephone Encounter (Signed)
Copied from [place] [id number]. Topic: General - Inquiry >> Mar 14, 2019  4:07 PM Alanda Slim E wrote: Reason for CRM: Pt called to inquire about her lab results/ please advise

## 2019-03-15 NOTE — Telephone Encounter (Signed)
Please let the patient know that her TSH is low. This could indicate hyperthyroidism. She will need to have follow-up lab work to determine if her thyroid hormone (free T4) is elevated. Please get her scheduled for that. Her liver function testing was normal. Thanks.

## 2019-03-16 DIAGNOSIS — M25612 Stiffness of left shoulder, not elsewhere classified: Secondary | ICD-10-CM | POA: Diagnosis not present

## 2019-03-16 DIAGNOSIS — M25512 Pain in left shoulder: Secondary | ICD-10-CM | POA: Diagnosis not present

## 2019-03-16 NOTE — Telephone Encounter (Signed)
Dr. Caryl Bis this patient is also asking about her Chest Xray she states she had that also, can you let me know what those results are and I'll call her back, and I did schedule her lab and gave her your earlier results.  Mubashir Mallek,cma

## 2019-03-17 ENCOUNTER — Other Ambulatory Visit: Payer: Self-pay | Admitting: Family Medicine

## 2019-03-17 DIAGNOSIS — R7989 Other specified abnormal findings of blood chemistry: Secondary | ICD-10-CM

## 2019-03-17 NOTE — Telephone Encounter (Signed)
Please let the patient know that her chest x-ray did not reveal any signs of pneumonia.  They did see a rounded density that they thought may be related to a pulmonary vein or could represent a small hiatal hernia however they could not rule out a pulmonary nodule.  Pulmonary nodules are oftentimes benign and noncancerous though do warrant follow-up to ensure that they do not change.  The radiologist noted that it would be useful to have a copy of the prior films to compare.  Given their concern for a possible pulmonary nodule on this film I would suggest that we proceed with a CT scan to determine the cause of this finding on the chest x-ray.  Once you speak with her I can place this order.

## 2019-03-19 NOTE — Telephone Encounter (Signed)
Order placed.  I will forward to Melissa to get her CT scan scheduled.

## 2019-03-19 NOTE — Telephone Encounter (Signed)
Pt understood the chest x-ray results and she is ok with you ordering her a CT scan.  Nina,cma

## 2019-03-20 ENCOUNTER — Other Ambulatory Visit: Payer: Self-pay | Admitting: Family Medicine

## 2019-03-20 DIAGNOSIS — M25512 Pain in left shoulder: Secondary | ICD-10-CM | POA: Diagnosis not present

## 2019-03-20 DIAGNOSIS — R911 Solitary pulmonary nodule: Secondary | ICD-10-CM

## 2019-03-20 DIAGNOSIS — M25612 Stiffness of left shoulder, not elsewhere classified: Secondary | ICD-10-CM | POA: Diagnosis not present

## 2019-03-21 ENCOUNTER — Other Ambulatory Visit (INDEPENDENT_AMBULATORY_CARE_PROVIDER_SITE_OTHER): Payer: PPO

## 2019-03-21 ENCOUNTER — Telehealth: Payer: Self-pay | Admitting: *Deleted

## 2019-03-21 ENCOUNTER — Other Ambulatory Visit: Payer: Self-pay

## 2019-03-21 DIAGNOSIS — R7989 Other specified abnormal findings of blood chemistry: Secondary | ICD-10-CM | POA: Diagnosis not present

## 2019-03-21 DIAGNOSIS — R911 Solitary pulmonary nodule: Secondary | ICD-10-CM

## 2019-03-21 LAB — T4, FREE: Free T4: 0.92 ng/dL (ref 0.60–1.60)

## 2019-03-21 NOTE — Addendum Note (Signed)
Addended by: Leeanne Rio on: 03/21/2019 10:36 AM   Modules accepted: Orders

## 2019-03-21 NOTE — Telephone Encounter (Signed)
Pt came in for labs this morning & asked if I could check her BP. Pt denies any issues but reports that her readings at home shows her diastolic number in the 57'K. Bp in office today was 149/69 (pulse: 59).

## 2019-03-22 ENCOUNTER — Other Ambulatory Visit: Payer: Self-pay | Admitting: Family Medicine

## 2019-03-22 DIAGNOSIS — R7989 Other specified abnormal findings of blood chemistry: Secondary | ICD-10-CM

## 2019-03-22 DIAGNOSIS — M25512 Pain in left shoulder: Secondary | ICD-10-CM | POA: Diagnosis not present

## 2019-03-22 DIAGNOSIS — M25612 Stiffness of left shoulder, not elsewhere classified: Secondary | ICD-10-CM | POA: Diagnosis not present

## 2019-03-22 NOTE — Telephone Encounter (Signed)
See below

## 2019-03-22 NOTE — Telephone Encounter (Signed)
Noted. As long as she is not getting light headed with her diastolics in the 62Y I would suggest she check her BP daily for one week and contact us with the readings at that time. Thanks.

## 2019-03-23 NOTE — Telephone Encounter (Signed)
Left message for patient to return call back. PEC may give information.  

## 2019-03-27 DIAGNOSIS — M25512 Pain in left shoulder: Secondary | ICD-10-CM | POA: Diagnosis not present

## 2019-03-27 DIAGNOSIS — M25612 Stiffness of left shoulder, not elsewhere classified: Secondary | ICD-10-CM | POA: Diagnosis not present

## 2019-03-28 ENCOUNTER — Other Ambulatory Visit: Payer: Self-pay

## 2019-03-28 ENCOUNTER — Telehealth: Payer: Self-pay

## 2019-03-28 ENCOUNTER — Ambulatory Visit
Admission: RE | Admit: 2019-03-28 | Discharge: 2019-03-28 | Disposition: A | Discharge status: Home / Self Care | Payer: PPO | Source: Ambulatory Visit | Attending: Family Medicine | Admitting: Family Medicine

## 2019-03-28 DIAGNOSIS — R911 Solitary pulmonary nodule: Secondary | ICD-10-CM | POA: Diagnosis not present

## 2019-03-28 NOTE — Telephone Encounter (Signed)
Noted. We will see what her repeat labs show in one month.

## 2019-03-28 NOTE — Telephone Encounter (Signed)
Copied from [place] [id number]. Topic: General - Call Back - No Documentation >> Mar 27, 2019  4:52 PM Sheran Luz wrote: Patient returning call to [facility]. Requesting call back.

## 2019-03-29 DIAGNOSIS — M25512 Pain in left shoulder: Secondary | ICD-10-CM | POA: Diagnosis not present

## 2019-03-29 DIAGNOSIS — M25612 Stiffness of left shoulder, not elsewhere classified: Secondary | ICD-10-CM | POA: Diagnosis not present

## 2019-04-03 ENCOUNTER — Other Ambulatory Visit: Payer: Self-pay | Admitting: Family Medicine

## 2019-04-04 DIAGNOSIS — M25612 Stiffness of left shoulder, not elsewhere classified: Secondary | ICD-10-CM | POA: Diagnosis not present

## 2019-04-04 DIAGNOSIS — M25512 Pain in left shoulder: Secondary | ICD-10-CM | POA: Diagnosis not present

## 2019-04-05 ENCOUNTER — Encounter: Payer: Self-pay | Admitting: Family Medicine

## 2019-04-05 ENCOUNTER — Telehealth: Payer: Self-pay | Admitting: Family Medicine

## 2019-04-05 ENCOUNTER — Encounter: Payer: Self-pay | Admitting: *Deleted

## 2019-04-05 DIAGNOSIS — R911 Solitary pulmonary nodule: Secondary | ICD-10-CM

## 2019-04-05 DIAGNOSIS — J479 Bronchiectasis, uncomplicated: Secondary | ICD-10-CM

## 2019-04-05 DIAGNOSIS — E041 Nontoxic single thyroid nodule: Secondary | ICD-10-CM

## 2019-04-05 HISTORY — DX: Solitary pulmonary nodule: R91.1

## 2019-04-05 NOTE — Telephone Encounter (Signed)
Pt states she still has not heard anything about pulmonology referral and a thyroid US. Please advise

## 2019-04-05 NOTE — Telephone Encounter (Signed)
I have placed orders for these today. It may be 1-2 weeks prior to her being contacted regarding these things.  If she does not hear anything in that timeframe she needs to contact us.

## 2019-04-06 ENCOUNTER — Encounter: Payer: Self-pay | Admitting: *Deleted

## 2019-04-06 ENCOUNTER — Ambulatory Visit (INDEPENDENT_AMBULATORY_CARE_PROVIDER_SITE_OTHER): Payer: PPO

## 2019-04-06 ENCOUNTER — Other Ambulatory Visit: Payer: Self-pay

## 2019-04-06 DIAGNOSIS — M25612 Stiffness of left shoulder, not elsewhere classified: Secondary | ICD-10-CM | POA: Diagnosis not present

## 2019-04-06 DIAGNOSIS — Z0000 Encounter for general adult medical examination without abnormal findings: Secondary | ICD-10-CM

## 2019-04-06 DIAGNOSIS — M25512 Pain in left shoulder: Secondary | ICD-10-CM | POA: Diagnosis not present

## 2019-04-06 NOTE — Telephone Encounter (Signed)
Sent mychart message

## 2019-04-06 NOTE — Progress Notes (Signed)
Subjective:   Morgan Byrd is a 83 y.o. female who presents for Medicare Annual (Subsequent) preventive examination.  Review of Systems:  No ROS.  Medicare Wellness Virtual Visit.  Visual/audio telehealth visit, UTA vital signs.   See social history for additional risk factors.   Cardiac Risk Factors include: advanced age (>70men, >30 women);hypertension     Objective:     Vitals: There were no vitals taken for this visit.  There is no height or weight on file to calculate BMI.  Advanced Directives 04/06/2019 02/14/2018 02/10/2017 12/26/2015  Does Patient Have a Medical Advance Directive? Yes Yes Yes No  Type of Paramedic of [place];Living will [place];Living will [place];Living will -  Does patient want to make changes to medical advance directive? No - Patient declined No - Patient declined No - Patient declined -  Copy of [place] in Chart? No - copy requested No - copy requested No - copy requested -    Tobacco Social History   Tobacco Use  Smoking Status Former Smoker  Smokeless Tobacco Never Used     Counseling given: Not Answered   Clinical Intake:  Pre-visit preparation completed: Yes        Diabetes: No  How often do you need to have someone help you when you read instructions, pamphlets, or other written materials from your doctor or pharmacy?: 1 - Never  Interpreter Needed?: No     Past Medical History:  Diagnosis Date  . Anxiety   . Hypertension    Past Surgical History:  Procedure Laterality Date  . ABDOMINAL HYSTERECTOMY    . BLADDER SUSPENSION    . BREAST BIOPSY Right 1978   neg  . COLONOSCOPY WITH PROPOFOL N/A 12/26/2015   Procedure: COLONOSCOPY WITH PROPOFOL;  Surgeon: Manya Silvas, MD;  Location: [facility] ENDOSCOPY;  Service: Endoscopy;  Laterality: N/A;  . REPLACEMENT TOTAL KNEE BILATERAL    . SHOULDER SURGERY    . VAGINAL DELIVERY     2    Family History  Problem Relation Age of Onset  . Dementia Mother   . Arthritis Mother   . Heart disease Father   . Hypertension Father   . Cancer Father        prostate  . Diabetes Father   . Diabetes Sister   . Cancer Sister        breast  . Breast cancer Sister 44  . Heart disease Sister   . Vasculitis Son   . Kidney disease Son   . Pneumonia Son   . Breast cancer Cousin 83   Social History   Socioeconomic History  . Marital status: Married    Spouse name: Not on file  . Number of children: Not on file  . Years of education: Not on file  . Highest education level: Not on file  Occupational History  . Not on file  Social Needs  . Financial resource strain: Not hard at all  . Food insecurity:    Worry: Never true    Inability: Never true  . Transportation needs:    Medical: No    Non-medical: No  Tobacco Use  . Smoking status: Former Research scientist (life sciences)  . Smokeless tobacco: Never Used  Substance and Sexual Activity  . Alcohol use: No  . Drug use: No  . Sexual activity: Never  Lifestyle  . Physical activity:    Days per week: 2 days  Minutes per session: 60 min  . Stress: Not at all  Relationships  . Social connections:    Talks on phone: Not on file    Gets together: Not on file    Attends religious service: Not on file    Active member of club or organization: Not on file    Attends meetings of clubs or organizations: Not on file    Relationship status: Not on file  Other Topics Concern  . Not on file  Social History Narrative   Lives in [place] with husband. Son and daughter live nearby.      Work - retired Network engineer      Diet - healthy, regular   Exercise - housework    Outpatient Encounter Medications as of 04/06/2019  Medication Sig  . ALLERGY RELIEF 180 MG tablet TAKE ONE TABLET EVERY DAY  . ALPRAZolam (XANAX) 0.25 MG tablet Take 1 tablet (0.25 mg total) by mouth 3 (three) times daily as needed for anxiety.  Marland Kitchen aspirin 81 MG EC tablet Take 81 mg by  mouth daily. Swallow whole.  . B Complex-C (SUPER B COMPLEX PO) Take by mouth.  Marland Kitchen CALCIUM-MAGNESIUM-ZINC PO Take by mouth.  . cetirizine (ZYRTEC) 10 MG tablet Take 10 mg by mouth daily.  . cholecalciferol (VITAMIN D) 1000 UNITS tablet Take 1,000 Units by mouth daily.  . fluticasone (FLONASE) 50 MCG/ACT nasal spray Place 2 sprays into both nostrils daily.  . hydrochlorothiazide (MICROZIDE) 12.5 MG capsule TAKE 1 CAPSULE EVERY DAY  . losartan (COZAAR) 100 MG tablet TAKE 1 TABLET DAILY  . metoprolol tartrate (LOPRESSOR) 50 MG tablet TAKE 1 TABLET BY MOUTH TWICE DAILY  . montelukast (SINGULAIR) 10 MG tablet TAKE ONE TABLET AT BEDTIME  . Multiple Vitamin (MULTIVITAMIN) capsule Take 1 capsule by mouth daily.  Marland Kitchen omeprazole (PRILOSEC) 20 MG capsule TAKE 1 CAPSULE BY MOUTH ONCE DAILY  . [DISCONTINUED] doxycycline (VIBRAMYCIN) 100 MG capsule    No facility-administered encounter medications on file as of 04/06/2019.     Activities of Daily Living In your present state of health, do you have any difficulty performing the following activities: 04/06/2019  Hearing? N  Vision? N  Difficulty concentrating or making decisions? N  Walking or climbing stairs? N  Dressing or bathing? N  Doing errands, shopping? N  Preparing Food and eating ? N  Using the Toilet? N  In the past six months, have you accidently leaked urine? Y  Comment Managed with daily pad  Do you have problems with loss of bowel control? N  Managing your Medications? N  Managing your Finances? N  Housekeeping or managing your Housekeeping? N  Some recent data might be hidden    Patient Care Team: Leone Haven, MD as PCP - General (Family Medicine)    Assessment:   This is a routine wellness examination for Morgan Byrd.  I connected with patient 04/06/19 at 11:30 AM EDT by a video/audio enabled telemedicine application and verified that I am speaking with the correct person using two identifiers. Patient stated full name and  DOB. Patient gave permission to continue with virtual visit. Patient's location was at home and Nurse's location was at [place] office.   Health Screenings  Mammogram - 07/2018 Colonoscopy - 12/2015 Bone Density - 12/2016; new order placed 11/2018.  Glaucoma -none Hearing -demonstrates normal hearing during visit. Hemoglobin A1C - 11/2017 5.6) Cholesterol - 11/2017 (180) Dental- every 6 months.  Vision- visits within the last 12 months.  Social  Alcohol intake -  no         Smoking history- former    Smokers in home? none Illicit drug use? none Exercise - walks her  dog daily 20 minutes, physical therapy for  L shoulder BID, 60 minutes.  Diet - regular Sexually Active -not currently BMI- discussed the importance of a healthy diet, water intake and the benefits of aerobic exercise.  Educational material provided.   Safety  Patient feels safe at home- yes Patient does have smoke detectors at home- yes Patient does wear sunscreen or protective clothing when in direct sunlight -yes Patient does wear seat belt when in a moving vehicle -yes  Covid-19 precautions and sickness symptoms discussed.   Activities of Daily Living Patient denies needing assistance with: driving, household chores, feeding themselves, getting from bed to chair, getting to the toilet, bathing/showering, dressing, managing money, or preparing meals.  No new identified risk were noted.    Depression Screen Patient denies losing interest in daily life, feeling hopeless, or crying easily over simple problems.   Medication-taking as directed and without issues.   Fall Screen Patient denies being afraid of falling or falling in the last year.   Memory Screen Patient is alert.  Patient denies difficulty focusing, concentrating or misplacing items. Correctly identified the president of the [place], season and recall. Patient likes to read for brain stimulation.  Immunizations The following Immunizations were discussed:  Influenza, shingles, pneumonia, and tetanus.   Other Providers Patient Care Team: Leone Haven, MD as PCP - General (Family Medicine)  Exercise Activities and Dietary recommendations Current Exercise Habits: Home exercise routine, Type of exercise: walking(Walks her dog daily), Time (Minutes): 20, Intensity: Mild  Goals      Patient Stated   . Follow up with Provider as scheduled (pt-stated)     Awaiting appointment for thyroid ultrasound Complete dexa scan        Fall Risk Fall Risk  04/06/2019 11/13/2018 02/14/2018 08/03/2017 02/10/2017  Falls in the past year? 0 0 No No No  Number falls in past yr: - 0 - - -  Injury with Fall? - 0 - - -   Depression Screen PHQ 2/9 Scores 04/06/2019 11/13/2018 02/14/2018 08/03/2017  PHQ - 2 Score 0 0 0 0     Cognitive Function MMSE - Mini Mental State Exam 02/14/2018 02/10/2017  Orientation to time 5 5  Orientation to Place 5 5  Registration 3 3  Attention/ Calculation 5 5  Recall 2 3  Language- name 2 objects 2 2  Language- repeat 1 1  Language- follow 3 step command 3 3  Language- read & follow direction 1 1  Write a sentence 1 1  Copy design 1 1  Total score 29 30     6CIT Screen 04/06/2019  What Year? 0 points  What month? 0 points  What time? 0 points  Count back from 20 0 points  Months in reverse 0 points  Repeat phrase 0 points  Total Score 0    Immunization History  Administered Date(s) Administered  . Influenza, High Dose Seasonal PF 08/05/2016  . Influenza,inj,Quad PF,6+ Mos 08/24/2014, 08/05/2015  . Influenza-Unspecified 08/08/2012, 08/11/2013  . Pneumococcal Conjugate-13 05/03/2014  . Pneumococcal Polysaccharide-23 04/25/2005  . Tdap 04/25/2012  . Zoster 06/26/2011   Screening Tests Health Maintenance  Topic Date Due  . INFLUENZA VACCINE  06/09/2019  . TETANUS/TDAP  04/25/2022  . DEXA SCAN  Completed  . PNA vac Low Risk Adult  Completed  Plan:    End of life planning; Advance aging; Advanced directives  discussed.  Copy of current HCPOA/Living Will requested.    I have personally reviewed and noted the following in the patient's chart:   . Medical and social history . Use of alcohol, tobacco or illicit drugs  . Current medications and supplements . Functional ability and status . Nutritional status . Physical activity . Advanced directives . List of other physicians . Hospitalizations, surgeries, and ER visits in previous 12 months . Vitals . Screenings to include cognitive, depression, and falls . Referrals and appointments  In addition, I have reviewed and discussed with patient certain preventive protocols, quality metrics, and best practice recommendations. A written personalized care plan for preventive services as well as general preventive health recommendations were provided to patient.     Varney Biles, LPN  07/18/3111

## 2019-04-06 NOTE — Patient Instructions (Addendum)
  Ms. Flett , Thank you for taking time to come for your Medicare Wellness Visit. I appreciate your ongoing commitment to your health goals. Please review the following plan we discussed and let me know if I can assist you in the future.   These are the goals we discussed: Goals      Patient Stated   . Follow up with Provider as scheduled (pt-stated)     Awaiting appointment for thyroid ultrasound Complete dexa scan        This is a list of the screening recommended for you and due dates:  Health Maintenance  Topic Date Due  . Flu Shot  06/09/2019  . Tetanus Vaccine  04/25/2022  . DEXA scan (bone density measurement)  Completed  . Pneumonia vaccines  Completed

## 2019-04-10 ENCOUNTER — Telehealth: Payer: Self-pay | Admitting: Internal Medicine

## 2019-04-10 DIAGNOSIS — M25612 Stiffness of left shoulder, not elsewhere classified: Secondary | ICD-10-CM | POA: Diagnosis not present

## 2019-04-10 DIAGNOSIS — M25512 Pain in left shoulder: Secondary | ICD-10-CM | POA: Diagnosis not present

## 2019-04-10 NOTE — Telephone Encounter (Signed)
Called patient for COVID-19 pre-screening for in office visit.  Have you recently traveled any where out of the local area in the last 2 weeks? No  Have you been in close contact with a person diagnosed with COVID-19 within the last 2 weeks? No Do you currently have any of the following symptoms? If so, when did they start? Cough     Diarrhea   Joint Pain Fever      Muscle Pain   Red eyes Shortness of breath   Abdominal pain  Vomiting Loss of smell    Rash    Sore Throat Headache    Weakness   Bruising or bleeding   Okay to proceed with visit 04/11/2019

## 2019-04-10 NOTE — Progress Notes (Signed)
I have reviewed the above note and agree.  Timya Trimmer, M.D.  

## 2019-04-11 ENCOUNTER — Other Ambulatory Visit
Admission: RE | Admit: 2019-04-11 | Discharge: 2019-04-11 | Disposition: A | Discharge status: Home / Self Care | Payer: PPO | Source: Ambulatory Visit | Attending: Internal Medicine | Admitting: Internal Medicine

## 2019-04-11 ENCOUNTER — Encounter: Payer: Self-pay | Admitting: Internal Medicine

## 2019-04-11 ENCOUNTER — Ambulatory Visit: Payer: PPO | Admitting: Internal Medicine

## 2019-04-11 ENCOUNTER — Other Ambulatory Visit: Payer: Self-pay

## 2019-04-11 VITALS — BP 120/74 | HR 61 | Temp 97.7°F | Ht 61.0 in | Wt 160.2 lb

## 2019-04-11 DIAGNOSIS — J471 Bronchiectasis with (acute) exacerbation: Secondary | ICD-10-CM

## 2019-04-11 NOTE — Addendum Note (Signed)
Addended by: Santiago Bur on: 04/11/2019 12:11 PM   Modules accepted: Orders

## 2019-04-11 NOTE — Patient Instructions (Signed)
Will check blood work and see you back in a year. Call us back sooner if you begin to have recurrent chest infections.

## 2019-04-11 NOTE — Progress Notes (Signed)
[facility] Consultation      Assessment and Plan:  Bronchiectasis with acute bronchitis. - Patient appears to have an episode of acute bronchitis occurring after recent surgery which now appears to be resolving. - CT chest shows what appears to be incidental findings of bilateral bronchiectasis, these appear to be mild.  Patient has no history of associated rheumatological/immunological conditions or known infections.  She does not have history of recurrent infections which could be associated with significant bronchiectasis, she does not appear to have problems with excessive expectoration of sputum.  Will check a TB QuantiFERON and rheumatoid factor, if these were negative, then I think this can be monitored conservatively with a yearly chest x-ray and office visit.  Orders Placed This Encounter  Procedures  . QuantiFERON-TB Gold Plus  . Rheumatoid factor   Return in about 1 year (around 04/10/2020).   Date: 04/11/2019  MRN# [id number] Morgan Byrd 08-Sep-1936    Morgan Byrd is a 83 y.o. old female seen in consultation for chief complaint of:    Chief Complaint  Patient presents with  . pulmonary consult    per Dr. Caryl Bis- CT 03/28/2019. occ prod cough with greenish to yellow mucus.     HPI:  She has been having symptoms of mild cough for a few months she has occasional expectoration which has been present for the same amount of time which has been mild. She denies dyspnea. She is fairly active, she drives a car, cleans and cooks her home, she mows her yard.  Her bout of bronchitis in March happened after a rotator cuff surgery and she required 2 courses of abx which seemed to take care of it. She denies any previous episodes of bronchitis, she gets an occasional cold once per year but nothing like this past bout.  No weight loss.  No hemoptysis.  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  She has a dog, in bed.  She has  never been tested for allergies but she has a lot of nasal drainage. She takes Nature conservation officer, flonase occasionally and that seems to control things.  Denies RA.   CT chest 03/28/2019>> images personally reviewed, there is bilateral diffuse bronchiectasis, which appears to be mild.  Lungs are otherwise unremarkable.   PMHX:   Past Medical History:  Diagnosis Date  . Anxiety   . Hypertension    Surgical Hx:  Past Surgical History:  Procedure Laterality Date  . ABDOMINAL HYSTERECTOMY    . BLADDER SUSPENSION    . BREAST BIOPSY Right 1978   neg  . COLONOSCOPY WITH PROPOFOL N/A 12/26/2015   Procedure: COLONOSCOPY WITH PROPOFOL;  Surgeon: Manya Silvas, MD;  Location: [facility] ENDOSCOPY;  Service: Endoscopy;  Laterality: N/A;  . REPLACEMENT TOTAL KNEE BILATERAL    . SHOULDER SURGERY    . VAGINAL DELIVERY     2   Family Hx:  Family History  Problem Relation Age of Onset  . Dementia Mother   . Arthritis Mother   . Heart disease Father   . Hypertension Father   . Cancer Father        prostate  . Diabetes Father   . Diabetes Sister   . Cancer Sister        breast  . Breast cancer Sister 49  . Heart disease Sister   . Vasculitis Son   . Kidney disease Son   . Pneumonia Son   . Breast  cancer Cousin 62   Social Hx:   Social History   Tobacco Use  . Smoking status: Former Smoker    Packs/day: 1.00    Years: 4.00    Pack years: 4.00    Types: Cigarettes    Last attempt to quit: 1974    Years since quitting: 46.4  . Smokeless tobacco: Never Used  Substance Use Topics  . Alcohol use: No  . Drug use: No   Medication:    Current Outpatient Medications:  .  ALLERGY RELIEF 180 MG tablet, TAKE ONE TABLET EVERY DAY, Disp: 90 tablet, Rfl: 1 .  aspirin 81 MG EC tablet, Take 81 mg by mouth daily. Swallow whole., Disp: , Rfl:  .  B Complex-C (SUPER B COMPLEX PO), Take by mouth., Disp: , Rfl:  .  CALCIUM-MAGNESIUM-ZINC PO, Take by mouth., Disp: , Rfl:  .  cetirizine  (ZYRTEC) 10 MG tablet, Take 10 mg by mouth daily., Disp: , Rfl:  .  cholecalciferol (VITAMIN D) 1000 UNITS tablet, Take 1,000 Units by mouth daily., Disp: , Rfl:  .  fluticasone (FLONASE) 50 MCG/ACT nasal spray, Place 2 sprays into both nostrils daily., Disp: 16 g, Rfl: 6 .  hydrochlorothiazide (MICROZIDE) 12.5 MG capsule, TAKE 1 CAPSULE EVERY DAY, Disp: 90 capsule, Rfl: 3 .  losartan (COZAAR) 100 MG tablet, TAKE 1 TABLET DAILY, Disp: 90 tablet, Rfl: 3 .  metoprolol tartrate (LOPRESSOR) 50 MG tablet, TAKE 1 TABLET BY MOUTH TWICE DAILY, Disp: 180 tablet, Rfl: 1 .  montelukast (SINGULAIR) 10 MG tablet, TAKE ONE TABLET AT BEDTIME, Disp: 90 tablet, Rfl: 1 .  Multiple Vitamin (MULTIVITAMIN) capsule, Take 1 capsule by mouth daily., Disp: , Rfl:  .  omeprazole (PRILOSEC) 20 MG capsule, TAKE 1 CAPSULE BY MOUTH ONCE DAILY, Disp: 30 capsule, Rfl: 1   Allergies:  Codeine; Fosamax [alendronate sodium]; and Lexapro [escitalopram oxalate]  Review of Systems: Gen:  Denies  fever, sweats, chills HEENT: Denies blurred vision, double vision. bleeds, sore throat Cvc:  No dizziness, chest pain. Resp:   Denies cough or sputum production, shortness of breath Gi: Denies swallowing difficulty, stomach pain. Gu:  Denies bladder incontinence, burning urine Ext:   No Joint pain, stiffness. Skin: No skin rash,  hives  Endoc:  No polyuria, polydipsia. Psych: No depression, insomnia. Other:  All other systems were reviewed with the patient and were negative other that what is mentioned in the HPI.   Physical Examination:   VS: BP 120/74 (BP Location: Left Arm, Cuff Size: Normal)   Pulse 61   Temp 97.7 F (36.5 C) (Oral)   Ht 5\' 1"  (1.549 m)   Wt 160 lb 3.2 oz (72.7 kg)   SpO2 94%   BMI 30.27 kg/m   General Appearance: No distress  Neuro:without focal findings,  speech normal,  HEENT: PERRLA, EOM intact.   Pulmonary: normal breath sounds, No wheezing.  CardiovascularNormal S1,S2.  No m/r/g.   Abdomen:  Benign, Soft, non-tender. Renal:  No costovertebral tenderness  GU:  No performed at this time. Endoc: No evident thyromegaly, no signs of acromegaly. Skin:   warm, no rashes, no ecchymosis  Extremities: normal, no cyanosis, clubbing.  Other findings:    LABORATORY PANEL:   CBC No results for input(s): WBC, HGB, HCT, PLT in the last 168 hours. ------------------------------------------------------------------------------------------------------------------  Chemistries  No results for input(s): NA, K, CL, CO2, GLUCOSE, BUN, CREATININE, CALCIUM, MG, AST, ALT, ALKPHOS, BILITOT in the last 168 hours.  Invalid input(s): GFRCGP ------------------------------------------------------------------------------------------------------------------  Cardiac  Enzymes No results for input(s): TROPONINI in the last 168 hours. ------------------------------------------------------------  RADIOLOGY:  No results found.     Thank  you for the consultation and for allowing [facility] to assist in the care of your patient. Our recommendations are noted above.  Please contact us if we can be of further service.   Marda Stalker, M.D., F.C.C.P.  Board Certified in Internal Medicine, Pulmonary Medicine, [place], and Sleep Medicine.  [facility] Office Number: [phone]   04/11/2019

## 2019-04-12 LAB — RHEUMATOID FACTOR: Rheumatoid fact SerPl-aCnc: 11.2 IU/mL (ref 0.0–13.9)

## 2019-04-13 LAB — QUANTIFERON-TB GOLD PLUS: QuantiFERON-TB Gold Plus: NEGATIVE

## 2019-04-13 LAB — QUANTIFERON-TB GOLD PLUS (RQFGPL)
QuantiFERON Mitogen Value: >10 IU/mL
QuantiFERON Nil Value: 0.07 IU/mL
QuantiFERON TB1 Ag Value: 0.03 IU/mL
QuantiFERON TB2 Ag Value: 0.04 IU/mL

## 2019-04-17 ENCOUNTER — Ambulatory Visit: Payer: PPO

## 2019-04-18 ENCOUNTER — Other Ambulatory Visit: Payer: Self-pay | Admitting: Family Medicine

## 2019-04-18 DIAGNOSIS — J309 Allergic rhinitis, unspecified: Secondary | ICD-10-CM

## 2019-04-20 ENCOUNTER — Other Ambulatory Visit: Payer: Self-pay

## 2019-04-20 ENCOUNTER — Ambulatory Visit
Admission: RE | Admit: 2019-04-20 | Discharge: 2019-04-20 | Disposition: A | Discharge status: Home / Self Care | Payer: PPO | Source: Ambulatory Visit | Attending: Family Medicine | Admitting: Family Medicine

## 2019-04-20 DIAGNOSIS — E041 Nontoxic single thyroid nodule: Secondary | ICD-10-CM | POA: Insufficient documentation

## 2019-04-20 DIAGNOSIS — E042 Nontoxic multinodular goiter: Secondary | ICD-10-CM | POA: Diagnosis not present

## 2019-04-23 ENCOUNTER — Other Ambulatory Visit: Payer: Self-pay

## 2019-04-23 ENCOUNTER — Other Ambulatory Visit (INDEPENDENT_AMBULATORY_CARE_PROVIDER_SITE_OTHER): Payer: PPO

## 2019-04-23 DIAGNOSIS — R7989 Other specified abnormal findings of blood chemistry: Secondary | ICD-10-CM | POA: Diagnosis not present

## 2019-04-23 LAB — T4, FREE: Free T4: 1.03 ng/dL (ref 0.60–1.60)

## 2019-04-23 LAB — TSH: TSH: 0.08 u[IU]/mL — ABNORMAL LOW (ref 0.35–4.50)

## 2019-04-24 ENCOUNTER — Ambulatory Visit
Admission: RE | Admit: 2019-04-24 | Discharge: 2019-04-24 | Disposition: A | Discharge status: Home / Self Care | Payer: PPO | Source: Ambulatory Visit | Attending: Family Medicine | Admitting: Family Medicine

## 2019-04-24 DIAGNOSIS — E2839 Other primary ovarian failure: Secondary | ICD-10-CM | POA: Diagnosis not present

## 2019-04-24 DIAGNOSIS — Z780 Asymptomatic menopausal state: Secondary | ICD-10-CM | POA: Diagnosis not present

## 2019-04-24 DIAGNOSIS — E663 Overweight: Secondary | ICD-10-CM | POA: Diagnosis not present

## 2019-04-24 DIAGNOSIS — M810 Age-related osteoporosis without current pathological fracture: Secondary | ICD-10-CM | POA: Insufficient documentation

## 2019-04-25 DIAGNOSIS — H43811 Vitreous degeneration, right eye: Secondary | ICD-10-CM | POA: Diagnosis not present

## 2019-04-26 ENCOUNTER — Encounter: Payer: Self-pay | Admitting: Family Medicine

## 2019-04-26 DIAGNOSIS — E041 Nontoxic single thyroid nodule: Secondary | ICD-10-CM | POA: Insufficient documentation

## 2019-05-02 ENCOUNTER — Telehealth: Payer: Self-pay | Admitting: Family Medicine

## 2019-05-02 DIAGNOSIS — E041 Nontoxic single thyroid nodule: Secondary | ICD-10-CM

## 2019-05-02 DIAGNOSIS — E0590 Thyrotoxicosis, unspecified without thyrotoxic crisis or storm: Secondary | ICD-10-CM

## 2019-05-02 NOTE — Telephone Encounter (Signed)
REFILL risedronate (ACTONEL) 150 MG tablet [facility] - [place], [place] - [place] [phone] (Phone) [phone] (Fax)

## 2019-05-05 ENCOUNTER — Other Ambulatory Visit: Payer: Self-pay | Admitting: Family Medicine

## 2019-05-07 ENCOUNTER — Other Ambulatory Visit: Payer: Self-pay | Admitting: Family Medicine

## 2019-05-07 DIAGNOSIS — J309 Allergic rhinitis, unspecified: Secondary | ICD-10-CM

## 2019-05-10 MED ORDER — RISEDRONATE SODIUM 35 MG PO TABS: 35.0000 mg | ORAL_TABLET | ORAL | 1 refills | Status: DC

## 2019-05-10 NOTE — Telephone Encounter (Signed)
I have sent the Actonel to the pharmacy.  I have also placed a referral for endocrinology.  Please let the patient know this.  Please also determine whether or not she is taking calcium and vitamin D supplement.  Thanks.

## 2019-05-10 NOTE — Telephone Encounter (Signed)
This patient wants to be started back on Actonel and [facility].  Aspin Palomarez,cma

## 2019-05-10 NOTE — Telephone Encounter (Signed)
Pt needs a refill for Actonel. Pt states she received a phone call from the office stating she needs to go back on the actonel. Please advise?    Pharmacy is [place], [place]  And pt mentioned someone told her that a referral was going to be set up to see the endocrinologist? Please advise? Thank you  Call pt @ [phone].

## 2019-05-14 ENCOUNTER — Ambulatory Visit (INDEPENDENT_AMBULATORY_CARE_PROVIDER_SITE_OTHER): Payer: PPO | Admitting: Family Medicine

## 2019-05-14 ENCOUNTER — Other Ambulatory Visit: Payer: Self-pay

## 2019-05-14 ENCOUNTER — Encounter: Payer: Self-pay | Admitting: Family Medicine

## 2019-05-14 DIAGNOSIS — I10 Essential (primary) hypertension: Secondary | ICD-10-CM

## 2019-05-14 DIAGNOSIS — M5431 Sciatica, right side: Secondary | ICD-10-CM | POA: Diagnosis not present

## 2019-05-14 DIAGNOSIS — E0590 Thyrotoxicosis, unspecified without thyrotoxic crisis or storm: Secondary | ICD-10-CM

## 2019-05-14 DIAGNOSIS — J479 Bronchiectasis, uncomplicated: Secondary | ICD-10-CM

## 2019-05-14 DIAGNOSIS — J309 Allergic rhinitis, unspecified: Secondary | ICD-10-CM | POA: Diagnosis not present

## 2019-05-14 DIAGNOSIS — M810 Age-related osteoporosis without current pathological fracture: Secondary | ICD-10-CM | POA: Diagnosis not present

## 2019-05-14 DIAGNOSIS — M5432 Sciatica, left side: Secondary | ICD-10-CM | POA: Diagnosis not present

## 2019-05-14 DIAGNOSIS — M5430 Sciatica, unspecified side: Secondary | ICD-10-CM | POA: Insufficient documentation

## 2019-05-14 DIAGNOSIS — E041 Nontoxic single thyroid nodule: Secondary | ICD-10-CM | POA: Diagnosis not present

## 2019-05-14 NOTE — Assessment & Plan Note (Signed)
Moderate pain symptoms that improve with increasing movement throughout the day.  Discussed referral for physical therapy though she declined this and opted to change her sleeping position and if not improving she will let us know.  If she changes her mind regarding physical therapy she will let us know.

## 2019-05-14 NOTE — Telephone Encounter (Signed)
Called and spoke with the patient and pt states she pick up the Actonel and she is taking the calcium and vitamin d supplement.  Amarrion Pastorino,cma

## 2019-05-14 NOTE — Assessment & Plan Note (Signed)
Referred to endocrinology previously.  If she does not hear from them in the next week she will contact us.

## 2019-05-14 NOTE — Assessment & Plan Note (Signed)
Referred to endocrinology

## 2019-05-14 NOTE — Assessment & Plan Note (Signed)
Relatively well controlled for her age.  She will continue her current regimen.

## 2019-05-14 NOTE — Progress Notes (Signed)
Virtual Visit via telephone Note  This visit type was conducted due to national recommendations for restrictions regarding the COVID-19 pandemic (e.g. social distancing).  This format is felt to be most appropriate for this patient at this time.  All issues noted in this document were discussed and addressed.  No physical exam was performed (except for noted visual exam findings with Video Visits).   I connected with Morgan Byrd today at 10:00 AM EDT by telephone and verified that I am speaking with the correct person using two identifiers. Location patient: home Location provider: home office Persons participating in the virtual visit: patient, provider, Mr Bellucci (husband)  I discussed the limitations, risks, security and privacy concerns of performing an evaluation and management service by telephone and the availability of in person appointments. I also discussed with the patient that there may be a patient responsible charge related to this service. The patient expressed understanding and agreed to proceed.  Interactive audio and video telecommunications were attempted between this provider and patient, however failed, due to patient having technical difficulties OR patient did not have access to video capability.  We continued and completed visit with audio only.   Reason for visit: follow-up  HPI: HYPERTENSION  Disease Monitoring  Home BP Monitoring typically 130s-140s or less Chest pain- no    Dyspnea- no Medications  Compliance-  Losartan, HCTZ, metoprolol.   Edema- no  Allergies: Patient does note some postnasal drip and occasional sneezing.  This has been going on for some time and is consistent with her allergies.  She takes Human resources officer and Singulair daily.  She is not using Flonase daily.  Bronchiectasis: Patient noted to have this on CT scan.  She saw pulmonology and had rheumatoid factor and QuantiFERON gold checked both of which were negative.  She feels well and only  occasionally has cough.  They want her to follow-up in 1 year.  Osteoporosis: She started on Actonel today.  She was previously on this and tolerated it well.  Sciatica: Patient notes right greater than left sciatica type discomfort that radiates from her hip down to her ankle down the side of her leg.  No back pain.  Only bothers her in the morning when she gets up and improves with increasing activity.  No numbness or weakness.  No incontinence.  She notes this started to bother her since she started sleeping in her bed again after her shoulder surgery.  Thyroid nodule/subclinical hyperthyroidism: She does note occasionally being sweaty or hot and having some fatigue though no other significant symptoms.  No palpitations.  She has been referred to endocrinology.   ROS: See pertinent positives and negatives per HPI.  Past Medical History:  Diagnosis Date  . Anxiety   . Hypertension     Past Surgical History:  Procedure Laterality Date  . ABDOMINAL HYSTERECTOMY    . BLADDER SUSPENSION    . BREAST BIOPSY Right 1978   neg  . COLONOSCOPY WITH PROPOFOL N/A 12/26/2015   Procedure: COLONOSCOPY WITH PROPOFOL;  Surgeon: Manya Silvas, MD;  Location: [facility] ENDOSCOPY;  Service: Endoscopy;  Laterality: N/A;  . REPLACEMENT TOTAL KNEE BILATERAL    . SHOULDER SURGERY    . VAGINAL DELIVERY     2    Family History  Problem Relation Age of Onset  . Dementia Mother   . Arthritis Mother   . Heart disease Father   . Hypertension Father   . Cancer Father  prostate  . Diabetes Father   . Diabetes Sister   . Cancer Sister        breast  . Breast cancer Sister 59  . Heart disease Sister   . Vasculitis Son   . Kidney disease Son   . Pneumonia Son   . Breast cancer Cousin 14    SOCIAL HX: Former smoker   Current Outpatient Medications:  .  ALLERGY RELIEF 180 MG tablet, TAKE ONE TABLET BY MOUTH EVERY DAY, Disp: 90 tablet, Rfl: 1 .  aspirin 81 MG EC tablet, Take 81 mg by mouth  daily. Swallow whole., Disp: , Rfl:  .  B Complex-C (SUPER B COMPLEX PO), Take by mouth., Disp: , Rfl:  .  CALCIUM-MAGNESIUM-ZINC PO, Take by mouth., Disp: , Rfl:  .  cetirizine (ZYRTEC) 10 MG tablet, Take 10 mg by mouth daily., Disp: , Rfl:  .  cholecalciferol (VITAMIN D) 1000 UNITS tablet, Take 1,000 Units by mouth daily., Disp: , Rfl:  .  fluticasone (FLONASE) 50 MCG/ACT nasal spray, Place 2 sprays into both nostrils daily., Disp: 16 g, Rfl: 6 .  hydrochlorothiazide (MICROZIDE) 12.5 MG capsule, TAKE 1 CAPSULE EVERY DAY, Disp: 90 capsule, Rfl: 3 .  losartan (COZAAR) 100 MG tablet, TAKE 1 TABLET DAILY, Disp: 90 tablet, Rfl: 3 .  metoprolol tartrate (LOPRESSOR) 50 MG tablet, TAKE 1 TABLET BY MOUTH TWICE DAILY, Disp: 180 tablet, Rfl: 1 .  montelukast (SINGULAIR) 10 MG tablet, TAKE ONE TABLET AT BEDTIME, Disp: 90 tablet, Rfl: 1 .  Multiple Vitamin (MULTIVITAMIN) capsule, Take 1 capsule by mouth daily., Disp: , Rfl:  .  omeprazole (PRILOSEC) 20 MG capsule, TAKE 1 CAPSULE BY MOUTH ONCE DAILY, Disp: 30 capsule, Rfl: 1 .  risedronate (ACTONEL) 35 MG tablet, Take 1 tablet (35 mg total) by mouth every 7 (seven) days. with water on empty stomach, nothing by mouth or lie down for next 30 minutes., Disp: 12 tablet, Rfl: 1  EXAM: This is a telehealth telephone visit notes no physical exam was completed.  ASSESSMENT AND PLAN:  Discussed the following assessment and plan:  Hypertension Relatively well controlled for her age.  She will continue her current regimen.  Allergic rhinitis Still with some postnasal drip.  She will start using Flonase daily.  She will continue Allegra and Singulair.  If not improving consider allergy evaluation.  Thyroid nodule Referred to endocrinology previously.  If she does not hear from them in the next week she will contact us.  Subclinical hyperthyroidism Referred to endocrinology.  Osteoporosis She will remain on Actonel.  Bronchiectasis ([place]) Overall minimal  symptoms if any.  She will continue to follow with pulmonology.  Monitor for worsening symptoms.  Sciatica Moderate pain symptoms that improve with increasing movement throughout the day.  Discussed referral for physical therapy though she declined this and opted to change her sleeping position and if not improving she will let us know.  If she changes her mind regarding physical therapy she will let us know.  Social distancing precautions and sick precautions given regarding COVID-19.   I discussed the assessment and treatment plan with the patient. The patient was provided an opportunity to ask questions and all were answered. The patient agreed with the plan and demonstrated an understanding of the instructions.   The patient was advised to call back or seek an in-person evaluation if the symptoms worsen or if the condition fails to improve as anticipated.  I provided 22 minutes of non-face-to-face time during this encounter.  Tommi Rumps, MD

## 2019-05-14 NOTE — Assessment & Plan Note (Signed)
Overall minimal symptoms if any.  She will continue to follow with pulmonology.  Monitor for worsening symptoms.

## 2019-05-14 NOTE — Assessment & Plan Note (Signed)
Still with some postnasal drip.  She will start using Flonase daily.  She will continue Allegra and Singulair.  If not improving consider allergy evaluation.

## 2019-05-14 NOTE — Assessment & Plan Note (Signed)
She will remain on Actonel.

## 2019-05-28 ENCOUNTER — Other Ambulatory Visit: Payer: Self-pay | Admitting: Family Medicine

## 2019-05-30 DIAGNOSIS — L578 Other skin changes due to chronic exposure to nonionizing radiation: Secondary | ICD-10-CM | POA: Diagnosis not present

## 2019-05-30 DIAGNOSIS — L570 Actinic keratosis: Secondary | ICD-10-CM | POA: Diagnosis not present

## 2019-05-30 DIAGNOSIS — Z85828 Personal history of other malignant neoplasm of skin: Secondary | ICD-10-CM | POA: Diagnosis not present

## 2019-05-30 DIAGNOSIS — Z872 Personal history of diseases of the skin and subcutaneous tissue: Secondary | ICD-10-CM | POA: Diagnosis not present

## 2019-05-30 DIAGNOSIS — L821 Other seborrheic keratosis: Secondary | ICD-10-CM | POA: Diagnosis not present

## 2019-06-07 DIAGNOSIS — M810 Age-related osteoporosis without current pathological fracture: Secondary | ICD-10-CM | POA: Diagnosis not present

## 2019-06-07 DIAGNOSIS — E042 Nontoxic multinodular goiter: Secondary | ICD-10-CM | POA: Diagnosis not present

## 2019-06-07 DIAGNOSIS — E0590 Thyrotoxicosis, unspecified without thyrotoxic crisis or storm: Secondary | ICD-10-CM | POA: Diagnosis not present

## 2019-06-12 ENCOUNTER — Other Ambulatory Visit: Payer: Self-pay | Admitting: Family Medicine

## 2019-06-12 DIAGNOSIS — Z1231 Encounter for screening mammogram for malignant neoplasm of breast: Secondary | ICD-10-CM

## 2019-07-24 ENCOUNTER — Other Ambulatory Visit: Payer: Self-pay

## 2019-07-25 ENCOUNTER — Other Ambulatory Visit: Payer: Self-pay

## 2019-07-25 ENCOUNTER — Ambulatory Visit (INDEPENDENT_AMBULATORY_CARE_PROVIDER_SITE_OTHER): Payer: PPO | Admitting: Family Medicine

## 2019-07-25 ENCOUNTER — Encounter: Payer: Self-pay | Admitting: Family Medicine

## 2019-07-25 VITALS — BP 150/80 | HR 59 | Temp 98.2°F | Ht 61.0 in | Wt 168.4 lb

## 2019-07-25 DIAGNOSIS — J309 Allergic rhinitis, unspecified: Secondary | ICD-10-CM | POA: Diagnosis not present

## 2019-07-25 DIAGNOSIS — E0590 Thyrotoxicosis, unspecified without thyrotoxic crisis or storm: Secondary | ICD-10-CM | POA: Diagnosis not present

## 2019-07-25 DIAGNOSIS — S99921A Unspecified injury of right foot, initial encounter: Secondary | ICD-10-CM

## 2019-07-25 DIAGNOSIS — E669 Obesity, unspecified: Secondary | ICD-10-CM

## 2019-07-25 DIAGNOSIS — Z23 Encounter for immunization: Secondary | ICD-10-CM | POA: Diagnosis not present

## 2019-07-25 DIAGNOSIS — I10 Essential (primary) hypertension: Secondary | ICD-10-CM | POA: Diagnosis not present

## 2019-07-25 DIAGNOSIS — S99929A Unspecified injury of unspecified foot, initial encounter: Secondary | ICD-10-CM | POA: Insufficient documentation

## 2019-07-25 LAB — COMPREHENSIVE METABOLIC PANEL
ALT: 21 U/L (ref 0–35)
AST: 27 U/L (ref 0–37)
Albumin: 4.3 g/dL (ref 3.5–5.2)
Alkaline Phosphatase: 61 U/L (ref 39–117)
BUN: 22 mg/dL (ref 6–23)
CO2: 31 mEq/L (ref 19–32)
Calcium: 9.9 mg/dL (ref 8.4–10.5)
Chloride: 103 mEq/L (ref 96–112)
Creatinine, Ser: 0.9 mg/dL (ref 0.40–1.20)
GFR: 60.13 mL/min (ref >60.00)
Glucose, Bld: 93 mg/dL (ref 70–99)
Potassium: 4.2 mEq/L (ref 3.5–5.1)
Sodium: 141 mEq/L (ref 135–145)
Total Bilirubin: 0.7 mg/dL (ref 0.2–1.2)
Total Protein: 7.8 g/dL (ref 6.0–8.3)

## 2019-07-25 LAB — LIPID PANEL
Cholesterol: 190 mg/dL (ref 0–200)
HDL: 65.4 mg/dL (ref >39.00)
LDL Cholesterol: 106 mg/dL — ABNORMAL HIGH (ref 0–99)
NonHDL: 124.98
Total CHOL/HDL Ratio: 3
Triglycerides: 93 mg/dL (ref 0.0–149.0)
VLDL: 18.6 mg/dL (ref 0.0–40.0)

## 2019-07-25 LAB — HEMOGLOBIN A1C: Hgb A1c MFr Bld: 5.9 % (ref 4.6–6.5)

## 2019-07-25 NOTE — Assessment & Plan Note (Signed)
Referred to ENT for evaluation.  To continue her current regimen.

## 2019-07-25 NOTE — Patient Instructions (Addendum)
Nice to see you. Please continue to monitor your blood pressure.  Please continue with your blood pressure medication.  We will get lab work today and contact you with results.  Please take your medication daily. Please try to cut down on the cookies and eating out.  She can cut this down by the third and progressively decreased from there. We will refer you to ENT for your allergy symptoms. Please monitor your right toenail if you develop signs of infection such as redness, drainage, warmth, swelling, or any new symptoms please let us know right away.

## 2019-07-25 NOTE — Progress Notes (Signed)
Morgan Rumps, MD Phone: [phone]  Morgan Byrd is a 83 y.o. female who presents today for f/u.  HYPERTENSION  Disease Monitoring  Home BP Monitoring typically well controlled in the 120s over 70s though she did have one episode recently where is 150/120. Chest pain.    Dyspnea-chronic and stable related to underlying lung disease. Medications  Compliance-taking HCTZ and losartan.  Patient has not taken her medication this morning. Edema-no.  Obesity: Patient has tried to stay active as she is now doing yard work and caring for her husband.  No specific exercise.  She describes her diet as not good.  She has a sandwich for lunch.  Eggs and toast and oatmeal for breakfast.  They have been eating out a lot for dinner.  Some sn at night..  Tingling in hands.  He had a EGD.  H Acking with cookies.  Allergic rhinitis: Patient reports some chronic mildly productive cough related to this.  Symptoms have been ongoing for months.  Feels as though phlegm is stuck in her throat.  She is a little hoarse.  Some postnasal drip.  No significant sneezing.  No fevers.  No GERD.  She is on Singulair, Flonase, and Allegra.  She did see pulmonology for mild cough with occasional production.  Hyperthyroidism: Patient saw endocrinology.  They started her on methimazole.  She still has some sweatiness and fatigue at times.  They plan to follow-up with an ultrasound in a year.  Right middle toe injury: Patient stubbed her right middle toe couple days ago.  There was some bleeding.  She has onychomycosis on a number of her toenails and has onychomycosis on this toenail.  The nail appears to be slightly separated from the nail bed.    Social History   Tobacco Use  Smoking Status Former Smoker  . Packs/day: 1.00  . Years: 4.00  . Pack years: 4.00  . Types: Cigarettes  . Quit date: 26  . Years since quitting: 46.7  Smokeless Tobacco Never Used     ROS see history of present illness  Objective  Physical Exam Vitals:   07/25/19 0810 07/25/19 0836  BP: (!) 150/80 (!) 150/80  Pulse: (!) 59   Temp: 98.2 F (36.8 C)   SpO2: 98%     BP Readings from Last 3 Encounters:  07/25/19 (!) 150/80  04/11/19 120/74  01/17/19 128/88   Wt Readings from Last 3 Encounters:  07/25/19 168 lb 6.4 oz (76.4 kg)  04/11/19 160 lb 3.2 oz (72.7 kg)  01/17/19 161 lb 9.6 oz (73.3 kg)    Physical Exam Constitutional:      General: She is not in acute distress.    Appearance: She is not diaphoretic.  HENT:     Head: Normocephalic and atraumatic.     Mouth/Throat:     Mouth: Mucous membranes are moist.     Pharynx: Oropharynx is clear.  Eyes:     Conjunctiva/sclera: Conjunctivae normal.     Pupils: Pupils are equal, round, and reactive to light.  Cardiovascular:     Rate and Rhythm: Normal rate and regular rhythm.     Heart sounds: Normal heart sounds.  Pulmonary:     Effort: Pulmonary effort is normal.     Breath sounds: Normal breath sounds.  Musculoskeletal:     Comments: Right middle toe with onychomycosis, slight separation of the toenail from the nail bed, no tenderness, no bleeding, no drainage  Skin:    General: Skin is warm  and dry.  Neurological:     Mental Status: She is alert.      Assessment/Plan: Please see individual problem list.  Allergic rhinitis Referred to ENT for evaluation.  To continue her current regimen.  Hypertension Adequately controlled at home.  She will continue her current regimen.  Subclinical hyperthyroidism She will continue management through endocrinology.  Obesity (BMI 30.0-34.9) Discussed dietary changes.  She will continue to stay more active.  Check A1c.  Toe injury Discussed that she will likely lose the toenail.  She will keep it clean.  Monitor for signs of infection.   Orders Placed This Encounter  Procedures  . Flu Vaccine QUAD High Dose(Fluad)  . Comp Met (CMET)  . Lipid panel  . HgB A1c  . Ambulatory referral to ENT     Referral Priority:   Routine    Referral Type:   Consultation    Referral Reason:   Specialty Services Required    Requested Specialty:   Otolaryngology    Number of Visits Requested:   1    No orders of the defined types were placed in this encounter.    Morgan Rumps, MD [place]

## 2019-07-25 NOTE — Assessment & Plan Note (Addendum)
Discussed that she will likely lose the toenail.  She will keep it clean.  Monitor for signs of infection.

## 2019-07-25 NOTE — Assessment & Plan Note (Signed)
Discussed dietary changes.  She will continue to stay more active.  Check A1c.

## 2019-07-25 NOTE — Assessment & Plan Note (Signed)
Adequately controlled at home.  She will continue her current regimen. 

## 2019-07-25 NOTE — Assessment & Plan Note (Signed)
She will continue management through endocrinology. 

## 2019-07-30 ENCOUNTER — Ambulatory Visit
Admission: RE | Admit: 2019-07-30 | Discharge: 2019-07-30 | Disposition: A | Discharge status: Home / Self Care | Payer: PPO | Source: Ambulatory Visit | Attending: Family Medicine | Admitting: Family Medicine

## 2019-07-30 DIAGNOSIS — Z1231 Encounter for screening mammogram for malignant neoplasm of breast: Secondary | ICD-10-CM

## 2019-07-31 ENCOUNTER — Other Ambulatory Visit: Payer: Self-pay | Admitting: Family Medicine

## 2019-07-31 DIAGNOSIS — N6310 Unspecified lump in the right breast, unspecified quadrant: Secondary | ICD-10-CM

## 2019-07-31 DIAGNOSIS — R928 Other abnormal and inconclusive findings on diagnostic imaging of breast: Secondary | ICD-10-CM

## 2019-07-31 DIAGNOSIS — N6489 Other specified disorders of breast: Secondary | ICD-10-CM

## 2019-08-07 ENCOUNTER — Ambulatory Visit
Admission: RE | Admit: 2019-08-07 | Discharge: 2019-08-07 | Disposition: A | Discharge status: Home / Self Care | Payer: PPO | Source: Ambulatory Visit | Attending: Family Medicine | Admitting: Family Medicine

## 2019-08-07 DIAGNOSIS — N6011 Diffuse cystic mastopathy of right breast: Secondary | ICD-10-CM | POA: Diagnosis not present

## 2019-08-07 DIAGNOSIS — J301 Allergic rhinitis due to pollen: Secondary | ICD-10-CM | POA: Diagnosis not present

## 2019-08-07 DIAGNOSIS — J342 Deviated nasal septum: Secondary | ICD-10-CM | POA: Diagnosis not present

## 2019-08-07 DIAGNOSIS — R928 Other abnormal and inconclusive findings on diagnostic imaging of breast: Secondary | ICD-10-CM

## 2019-08-07 DIAGNOSIS — N6310 Unspecified lump in the right breast, unspecified quadrant: Secondary | ICD-10-CM

## 2019-08-07 DIAGNOSIS — N6489 Other specified disorders of breast: Secondary | ICD-10-CM

## 2019-08-07 DIAGNOSIS — N6002 Solitary cyst of left breast: Secondary | ICD-10-CM | POA: Diagnosis not present

## 2019-08-20 ENCOUNTER — Other Ambulatory Visit: Payer: Self-pay | Admitting: Family Medicine

## 2019-08-20 DIAGNOSIS — J309 Allergic rhinitis, unspecified: Secondary | ICD-10-CM

## 2019-09-04 DIAGNOSIS — E0590 Thyrotoxicosis, unspecified without thyrotoxic crisis or storm: Secondary | ICD-10-CM | POA: Diagnosis not present

## 2019-09-06 ENCOUNTER — Ambulatory Visit: Payer: Self-pay

## 2019-09-06 NOTE — Telephone Encounter (Signed)
Incoming call from Pt.  With a complaint of SOB with exertion.  Pt. States that its been going on for several weeks.   States it comes and goes.   Rates it moderately.  Reviewed protocol with Pt.  Recommended Pt.  Go to Urgent Care for further evaluation. Pt, states that she will go the next episode  It occurs.         Reason for Disposition . [1] MODERATE difficulty breathing (e.g., speaks in phrases, SOB even at rest, pulse 100-120) AND [2] NEW-onset or WORSE than normal  Answer Assessment - Initial Assessment Questions 1. RESPIRATORY STATUS: "Describe your breathing?" (e.g., wheezing, shortness of breath, unable to speak, severe coughing)      SOB 2. ONSET: "When did this breathing problem begin?"     A while several weeks,  With exertion 3. PATTERN "Does the difficult breathing come and go, or has it been constant since it started?"     Comes and goes 4. SEVERITY: "How bad is your breathing?" (e.g., mild, moderate, severe)    - MILD: No SOB at rest, mild SOB with walking, speaks normally in sentences, can lay down, no retractions, pulse < 100.    - MODERATE: SOB at rest, SOB with minimal exertion and prefers to sit, cannot lie down flat, speaks in phrases, mild retractions, audible wheezing, pulse 100-120.    - SEVERE: Very SOB at rest, speaks in single words, struggling to breathe, sitting hunched forward, retractions, pulse > 120    moderate 5. RECURRENT SYMPTOM: "Have you had difficulty breathing before?" If so, ask: "When was the last time?" and "What happened that time?"     moderate 6. CARDIAC HISTORY: "Do you have any history of heart disease?" (e.g., heart attack, angina, bypass surgery, angioplasty)     denies 7. LUNG HISTORY: "Do you have any history of lung disease?"  (e.g., pulmonary embolus, asthma, emphysema)   denies 8. CAUSE: "What do you think is causing the breathing problem?"      9. OTHER SYMPTOMS: "Do you have any other symptoms? (e.g., dizziness, runny nose,  cough, chest pain, fever)     denies10. PREGNANCY: "Is there any chance you are pregnant?" "When was your last menstrual period?"      denies 51. TRAVEL: "Have you traveled out of the country in the last month?" (e.g., travel history, exposures)      Denies.  Protocols used: BREATHING DIFFICULTY-A-AH

## 2019-09-06 NOTE — Telephone Encounter (Signed)
I agree needs to be evaluated, urgent care is good  If does not want UC, she can do a virtual with [place]

## 2019-09-06 NOTE — Telephone Encounter (Signed)
I called and spoke with the patient and she stated that she feels somewhat better and thinks its stress I offered her a virtual visit she stated she will wait and see how she feels in the morning and if she still feels SOB she will go to urgent care.  Sidhant Helderman,cma

## 2019-09-06 NOTE — Telephone Encounter (Signed)
Called and spoke to patient.  Patient said that she has been having shortness of breath for the past several weeks.  Pt said that the SOB comes with exertion.  Pt said SOB comes and goes.  Pt said that she checked her blood pressure today and the systolic number was [id number] but when she checked it after 5 minutes it dropped to 145 then 130.  Advised patient to go Urgent Care to be evaluated in person.  Pt said that there would be no point in her going to the ED right now since nothing probably would be done.  Pt feels she should wait until next episode.  Asked pt to consider going to an urgent care, recommended [facility].  Patient said that she didn't want to go to urgent care to be around a lot of people who was probably there for COVID.  Informed patient that Dr. Caryl Bis is out of the office today but will forward notes to be reviewed by NP.

## 2019-09-07 DIAGNOSIS — J301 Allergic rhinitis due to pollen: Secondary | ICD-10-CM | POA: Diagnosis not present

## 2019-09-07 DIAGNOSIS — J019 Acute sinusitis, unspecified: Secondary | ICD-10-CM | POA: Diagnosis not present

## 2019-09-08 ENCOUNTER — Encounter: Payer: Self-pay | Admitting: Emergency Medicine

## 2019-09-08 ENCOUNTER — Emergency Department
Admission: EM | Admit: 2019-09-08 | Discharge: 2019-09-08 | Disposition: A | Discharge status: Home / Self Care | Payer: PPO | Attending: Emergency Medicine | Admitting: Emergency Medicine

## 2019-09-08 ENCOUNTER — Emergency Department: Payer: PPO

## 2019-09-08 ENCOUNTER — Other Ambulatory Visit: Payer: Self-pay

## 2019-09-08 DIAGNOSIS — Z7982 Long term (current) use of aspirin: Secondary | ICD-10-CM | POA: Insufficient documentation

## 2019-09-08 DIAGNOSIS — I10 Essential (primary) hypertension: Secondary | ICD-10-CM | POA: Insufficient documentation

## 2019-09-08 DIAGNOSIS — R0789 Other chest pain: Secondary | ICD-10-CM | POA: Diagnosis present

## 2019-09-08 DIAGNOSIS — Z79899 Other long term (current) drug therapy: Secondary | ICD-10-CM | POA: Insufficient documentation

## 2019-09-08 DIAGNOSIS — R0602 Shortness of breath: Secondary | ICD-10-CM | POA: Diagnosis not present

## 2019-09-08 DIAGNOSIS — R079 Chest pain, unspecified: Secondary | ICD-10-CM

## 2019-09-08 DIAGNOSIS — Z87891 Personal history of nicotine dependence: Secondary | ICD-10-CM | POA: Diagnosis not present

## 2019-09-08 DIAGNOSIS — I498 Other specified cardiac arrhythmias: Secondary | ICD-10-CM | POA: Insufficient documentation

## 2019-09-08 LAB — BASIC METABOLIC PANEL
Anion gap: 11 (ref 5–15)
BUN: 18 mg/dL (ref 8–23)
CO2: 23 mmol/L (ref 22–32)
Calcium: 8.9 mg/dL (ref 8.9–10.3)
Chloride: 106 mmol/L (ref 98–111)
Creatinine, Ser: 0.9 mg/dL (ref 0.44–1.00)
GFR calc Af Amer: >60 mL/min (ref >60)
GFR calc non Af Amer: >60 mL/min (ref >60)
Glucose, Bld: 118 mg/dL — ABNORMAL HIGH (ref 70–99)
Potassium: 3.8 mmol/L (ref 3.5–5.1)
Sodium: 140 mmol/L (ref 135–145)

## 2019-09-08 LAB — TROPONIN I (HIGH SENSITIVITY)
Troponin I (High Sensitivity): 10 ng/L (ref <18)
Troponin I (High Sensitivity): 11 ng/L (ref <18)
Troponin I (High Sensitivity): 12 ng/L (ref <18)

## 2019-09-08 LAB — CBC
HCT: 39 % (ref 36.0–46.0)
Hemoglobin: 12.6 g/dL (ref 12.0–15.0)
MCH: 31.2 pg (ref 26.0–34.0)
MCHC: 32.3 g/dL (ref 30.0–36.0)
MCV: 96.5 fL (ref 80.0–100.0)
Platelets: 225 10*3/uL (ref 150–400)
RBC: 4.04 MIL/uL (ref 3.87–5.11)
RDW: 13.4 % (ref 11.5–15.5)
WBC: 6.9 10*3/uL (ref 4.0–10.5)
nRBC: 0 % (ref 0.0–0.2)

## 2019-09-08 LAB — BRAIN NATRIURETIC PEPTIDE: B Natriuretic Peptide: 497 pg/mL — ABNORMAL HIGH (ref 0.0–100.0)

## 2019-09-08 MED ORDER — NITROGLYCERIN 2 % TD OINT
0.5000 [in_us] | TOPICAL_OINTMENT | Freq: Once | TRANSDERMAL | Status: AC
  Administered 2019-09-08: 0.5 [in_us] via TOPICAL
  Filled 2019-09-08: qty 1

## 2019-09-08 MED ORDER — SODIUM CHLORIDE 0.9% FLUSH
3.0000 mL | Freq: Once | INTRAVENOUS | Status: AC
  Administered 2019-09-08: 3 mL via INTRAVENOUS

## 2019-09-08 MED ORDER — FUROSEMIDE 10 MG/ML IJ SOLN
20.0000 mg | Freq: Once | INTRAMUSCULAR | Status: AC
  Administered 2019-09-08: 20 mg via INTRAVENOUS
  Filled 2019-09-08: qty 4

## 2019-09-08 MED ORDER — IRBESARTAN 75 MG PO TABS
37.5000 mg | ORAL_TABLET | Freq: Every day | ORAL | Status: DC
  Administered 2019-09-08: 37.5 mg via ORAL
  Filled 2019-09-08: qty 0.5

## 2019-09-08 MED ORDER — IRBESARTAN 75 MG PO TABS: 75.0000 mg | ORAL_TABLET | Freq: Every day | ORAL | 11 refills | Status: DC

## 2019-09-08 NOTE — ED Provider Notes (Signed)
Patient's troponins are negative.  Her chest x-ray looks like CHF and her BNP is elevated however she has no shortness of breath no symptoms no edema no JVD.  She feels well.  Per Dr. Clayborn Bigness I will give her some ARB will give irbesartan 75 mg once a day she will follow-up with him Monday return if she is worse.  I should add that her pulse is not 31 and obviously was the PVCs were being counted.   Nena Polio, MD 09/08/19 2036

## 2019-09-08 NOTE — ED Triage Notes (Addendum)
FIRST NURSE NOTE- here for CP and SHOB. Pulled for EKG.

## 2019-09-08 NOTE — Discharge Instructions (Addendum)
Please return for increasing pain, fever or shortness of breath or if you feel sicker at all.  Please take the irbesartan 75 mg once a day.  Please give Dr. Etta Quill office a call Monday morning.  Let him know you were in the emergency room with some chest pain bigeminy and a chest x-ray that look like congestive heart failure.  He should be out of see you rapidly.

## 2019-09-08 NOTE — ED Provider Notes (Signed)
[facility] Emergency Department Provider Note  ____________________________________________   None    (approximate)  I have reviewed the triage vital signs and the nursing notes.   HISTORY  Chief Complaint Chest Pain   HPI Morgan Byrd is a 83 y.o. female who presents to the emergency department for treatment and evaluation of central chest pressure for the past 2 days.  Symptoms started while bending over to help her husband put his shoes on.  She has not experienced similar symptoms in the past.   Past Medical History:  Diagnosis Date  . Anxiety   . Hypertension     Patient Active Problem List   Diagnosis Date Noted  . Toe injury 07/25/2019  . Subclinical hyperthyroidism 05/14/2019  . Bronchiectasis ([place]) 05/14/2019  . Sciatica 05/14/2019  . Thyroid nodule 04/26/2019  . Lung nodule 04/05/2019  . Bilateral lower extremity edema 02/21/2019  . Respiratory illness 02/13/2019  . Left shoulder pain 11/13/2018  . Femoral bruit 06/01/2018  . Allergic rhinitis 05/02/2018  . Obesity (BMI 30.0-34.9) 11/03/2017  . Anxiety 08/05/2016  . Neuropathy 02/02/2016  . Dysphagia 10/09/2012  . Hypertension 04/25/2012  . Low back pain 04/25/2012  . Osteoporosis 04/25/2012  . Raynaud phenomenon 04/25/2012    Past Surgical History:  Procedure Laterality Date  . ABDOMINAL HYSTERECTOMY    . BLADDER SUSPENSION    . BREAST BIOPSY Right 1978   neg  . COLONOSCOPY WITH PROPOFOL N/A 12/26/2015   Procedure: COLONOSCOPY WITH PROPOFOL;  Surgeon: Manya Silvas, MD;  Location: [facility] ENDOSCOPY;  Service: Endoscopy;  Laterality: N/A;  . REPLACEMENT TOTAL KNEE BILATERAL    . SHOULDER SURGERY    . VAGINAL DELIVERY     2    Prior to Admission medications   Medication Sig Start Date End Date Taking? Authorizing Provider  ALLERGY RELIEF 180 MG tablet TAKE ONE TABLET BY MOUTH EVERY DAY 05/07/19   Leone Haven, MD  aspirin 81 MG EC tablet Take 81 mg by mouth  daily. Swallow whole.    [provider]  B Complex-C (SUPER B COMPLEX PO) Take by mouth.    [provider]  CALCIUM-MAGNESIUM-ZINC PO Take by mouth.    [provider]  cetirizine (ZYRTEC) 10 MG tablet Take 10 mg by mouth daily.    [provider]  cholecalciferol (VITAMIN D) 1000 UNITS tablet Take 1,000 Units by mouth daily.    [provider]  fluticasone (FLONASE) 50 MCG/ACT nasal spray TAKE 2 SPRAYS INTO BOTH NOSTRILS DAILY 08/20/19   Guse, Jacquelynn Cree, FNP  hydrochlorothiazide (MICROZIDE) 12.5 MG capsule TAKE 1 CAPSULE EVERY DAY 11/28/18   Leone Haven, MD  losartan (COZAAR) 100 MG tablet TAKE 1 TABLET DAILY 11/28/18   Leone Haven, MD  metoprolol tartrate (LOPRESSOR) 50 MG tablet TAKE 1 TABLET BY MOUTH TWICE DAILY 05/07/19   Leone Haven, MD  montelukast (SINGULAIR) 10 MG tablet TAKE ONE TABLET AT BEDTIME 04/18/19   Leone Haven, MD  Multiple Vitamin (MULTIVITAMIN) capsule Take 1 capsule by mouth daily.    [provider]  omeprazole (PRILOSEC) 20 MG capsule TAKE 1 CAPSULE BY MOUTH EVERY DAY 05/29/19   Leone Haven, MD  risedronate (ACTONEL) 35 MG tablet Take 1 tablet (35 mg total) by mouth every 7 (seven) days. with water on empty stomach, nothing by mouth or lie down for next 30 minutes. 05/10/19   Leone Haven, MD    Allergies Codeine, Fosamax Fara Boros  sodium], and Lexapro [escitalopram oxalate]  Family History  Problem Relation Age of Onset  . Dementia Mother   . Arthritis Mother   . Heart disease Father   . Hypertension Father   . Cancer Father        prostate  . Diabetes Father   . Diabetes Sister   . Cancer Sister        breast  . Breast cancer Sister 37  . Heart disease Sister   . Vasculitis Son   . Kidney disease Son   . Pneumonia Son   . Breast cancer Cousin 62    Social History Social History   Tobacco Use  . Smoking status: Former Smoker    Packs/day: 1.00    Years: 4.00     Pack years: 4.00    Types: Cigarettes    Quit date: 1974    Years since quitting: 46.8  . Smokeless tobacco: Never Used  Substance Use Topics  . Alcohol use: No  . Drug use: No    Review of Systems  Constitutional: No fever/chills. Eyes: No visual changes. ENT: No sore throat. Cardiovascular: Positive for chest pressure.  Negative for pleuritic pain.  Negative for palpitations.  Negative for leg pain. Respiratory: Positive for shortness of breath. Gastrointestinal: Negative for abdominal pain.  No nausea, negative for vomiting.  No diarrhea.  No constipation. Genitourinary: Negative for dysuria. Musculoskeletal: Negative for back pain.  Skin: Negative for rash, lesion, wound. Neurological: Negative for headaches, focal weakness or numbness. ____________________________________________   PHYSICAL EXAM:  VITAL SIGNS: ED Triage Vitals  Enc Vitals Group     BP 09/08/19 1422 (!) 161/48     Pulse Rate 09/08/19 1422 (!) 33     Resp 09/08/19 1422 18     Temp 09/08/19 1422 98.4 F (36.9 C)     Temp Source 09/08/19 1422 Oral     SpO2 --      Weight 09/08/19 1422 162 lb (73.5 kg)     Height 09/08/19 1422 5\' 1"  (1.549 m)     Head Circumference --      Peak Flow --      Pain Score 09/08/19 1429 4     Pain Loc --      Pain Edu? --      Excl. in [place]? --     Constitutional: Alert and oriented.  Overall well appearing and in no acute distress.  Normal mental status. Eyes: Conjunctivae are normal. PERRL. Head: Atraumatic. Nose: No congestion/rhinnorhea. Mouth/Throat: Mucous membranes are moist.  Oropharynx non-erythematous. Tongue normal in size and color. Neck: No stridor.  No carotid bruit appreciated on exam. Hematological/Lymphatic/Immunilogical: No cervical lymphadenopathy. Cardiovascular: Normal rate, regular rhythm. Grossly normal heart sounds.  Good peripheral circulation. Respiratory: Normal respiratory effort.  No retractions. Lungs CTAB. Gastrointestinal: Soft and  nontender. No distention. No abdominal bruits. No CVA tenderness. Genitourinary: Exam deferred. Musculoskeletal: No lower extremity tenderness.  Nonpitting edema of extremities, left greater than right. Neurologic:  Normal speech and language. No gross focal neurologic deficits are appreciated. Skin:  Skin is warm, dry and intact. No rash noted. Psychiatric: Mood and affect are normal. Speech and behavior are normal.  ____________________________________________   LABS (all labs ordered are listed, but only abnormal results are displayed)  Labs Reviewed  BASIC METABOLIC PANEL - Abnormal; Notable for the following components:      Result Value   Glucose, Bld 118 (*)    All other components within normal limits  BRAIN NATRIURETIC  PEPTIDE - Abnormal; Notable for the following components:   B Natriuretic Peptide 497.0 (*)    All other components within normal limits  CBC  TROPONIN I (HIGH SENSITIVITY)  TROPONIN I (HIGH SENSITIVITY)  TROPONIN I (HIGH SENSITIVITY)   ____________________________________________  EKG  ED ECG REPORT I, Dora Clauss, FNP-BC personally viewed and interpreted this ECG.   Date: 09/08/2019  Rate: 63  Rhythm: LBBB, frequent PVC's noted  Axis: left  Intervals:none  ST&T Change: no ST elevation  ____________________________________________  RADIOLOGY  ED MD interpretation:  Cardiomegaly with pulmonary vascular prominence.  Official radiology report(s): Dg Chest Portable 1 View  Result Date: 09/08/2019 CLINICAL DATA:  Chest pain EXAM: PORTABLE CHEST 1 VIEW COMPARISON:  03/12/2019 FINDINGS: Cardiomegaly. Pulmonary vascular prominence. Bandlike scarring or atelectasis of the right lung base. The visualized skeletal structures are unremarkable. IMPRESSION: Cardiomegaly with pulmonary vascular prominence but without overt pulmonary edema. No acute abnormality of the lungs. Electronically Signed   By: Eddie Candle M.D.   On: 09/08/2019 14:53     ____________________________________________   PROCEDURES  Procedure(s) performed: None  Procedures  Critical Care performed: No  ____________________________________________   INITIAL IMPRESSION / ASSESSMENT AND PLAN / ED COURSE  83 year old female presenting to the emergency department for treatment and evaluation of central chest pressure.  Symptoms started 2 days ago.  She does feel that she has had some intermittent shortness of breath as well.  See HPI for further details.  Plan will be to get a chest x-ray, serial cardiac enzymes, and monitor her closely.  ----------------------------------------- 3:40 PM on 09/08/2019 -----------------------------------------  Initial troponin is 10.  Chest x-ray shows some cardiomegaly with pulmonary vascular prominence but no pulmonary edema.  BNP and Nitropaste ordered.  ----------------------------------------- 5:09 PM on 09/08/2019 -----------------------------------------  Dr. Clayborn Bigness paged for consult regarding new CHF, bigeminy, and central chest pressure. Two serial troponins are 10 and 11.  Patient states that she has had some relief of the chest pressure since having the nitroglycerin paste on.  She remains in bigeminy with a stable blood pressure.  ----------------------------------------- 5:34 PM on 09/08/2019 -----------------------------------------  Case discussed with Dr. Clayborn Bigness. He advises to give ACE or ARB and get her pressure under 150, check one additional set of cardiac enzymes and consider admission versus discharge.  ----------------------------------------- 6:30 PM on 09/08/2019 -----------------------------------------  Awaiting third troponin.  Care transitioned to Dr. Cinda Quest who will follow the patient to disposition.      ____________________________________________   FINAL CLINICAL IMPRESSION(S) / ED DIAGNOSES  Final diagnoses:  Bigeminy     ED Discharge Orders    None        Note:  This document was prepared using Dragon voice recognition software and may include unintentional dictation errors.   Victorino Dike, FNP 09/08/19 2025    Nena Polio, MD 09/08/19 2034

## 2019-09-08 NOTE — ED Triage Notes (Signed)
Chest pain x 2 days, EKG noted SR with ventricular bigeminy.

## 2019-09-08 NOTE — ED Notes (Signed)
Pt ambulated to the bathroom without assistance. 

## 2019-09-08 NOTE — ED Notes (Addendum)
Pt ambulated around the room without assistance, Pt O2 saturation maintained above 98%. Pt's heart rhythm stayed in the ventricular bigemny which is the rhythm she presented with.  Dr. Cinda Quest made aware.

## 2019-09-10 ENCOUNTER — Telehealth: Payer: Self-pay | Admitting: *Deleted

## 2019-09-10 DIAGNOSIS — R079 Chest pain, unspecified: Secondary | ICD-10-CM

## 2019-09-10 NOTE — Telephone Encounter (Signed)
Copied from [place] [id number]. Topic: General - Other >> Sep 10, 2019  7:42 AM Rayann Heman wrote: Reason for CRM: pt called and stated that she was in the hospital on 09/08/19 for chest tightness. Pt states that she was referred to cardiology at [facility] clinic. Pt would like her referral sent to [facility]. Pt would like a call back from the nurse regarding. Please advise

## 2019-09-10 NOTE — Telephone Encounter (Signed)
pt called and stated that she was in the hospital on 09/08/19 for chest tightness. Pt states that she was referred to cardiology at [facility] clinic. Pt would like her referral sent to [place] [facility]. Pt would like a call back from the nurse regarding. Please advise.  Nina,cma

## 2019-09-11 ENCOUNTER — Telehealth: Payer: Self-pay | Admitting: Family Medicine

## 2019-09-11 DIAGNOSIS — M810 Age-related osteoporosis without current pathological fracture: Secondary | ICD-10-CM | POA: Diagnosis not present

## 2019-09-11 DIAGNOSIS — E0590 Thyrotoxicosis, unspecified without thyrotoxic crisis or storm: Secondary | ICD-10-CM | POA: Diagnosis not present

## 2019-09-11 DIAGNOSIS — E042 Nontoxic multinodular goiter: Secondary | ICD-10-CM | POA: Diagnosis not present

## 2019-09-11 NOTE — Telephone Encounter (Signed)
I do not have a referral for her to see cardiology.

## 2019-09-11 NOTE — Telephone Encounter (Signed)
I called the patient and she stated she does not take the medications together.  She stated that she called yesterday ad no one called her back, I informed her I was sorry but she did get everything straight.  Geneva Pallas,cma

## 2019-09-11 NOTE — Telephone Encounter (Signed)
Please advise 

## 2019-09-11 NOTE — Telephone Encounter (Signed)
Referral placed.

## 2019-09-11 NOTE — Telephone Encounter (Signed)
She should not take both of those together. Pease contact the patient and see if she is taking the losartan. It looks like the irbesartan was prescribed by the ED. If she is taking the losartan she should not take the irbesartan. If she is not taking the losartan it is ok for her to take the irbesartan. Thanks.

## 2019-09-11 NOTE — Telephone Encounter (Signed)
irbesartan (AVAPRO) 75 MG tablet losartan (COZAAR) 100 MG tablet    Anette, with [facility], calling on behalf of patient to inquire if patient is to take both of these medications together. She is requesting staff contact patient to advise.

## 2019-09-17 ENCOUNTER — Other Ambulatory Visit: Payer: Self-pay

## 2019-09-17 ENCOUNTER — Encounter: Payer: Self-pay | Admitting: Cardiology

## 2019-09-17 ENCOUNTER — Ambulatory Visit (INDEPENDENT_AMBULATORY_CARE_PROVIDER_SITE_OTHER): Payer: PPO | Admitting: Cardiology

## 2019-09-17 VITALS — BP 150/80 | HR 59 | Temp 97.3°F | Ht 61.0 in | Wt 162.8 lb

## 2019-09-17 DIAGNOSIS — R0789 Other chest pain: Secondary | ICD-10-CM

## 2019-09-17 DIAGNOSIS — I517 Cardiomegaly: Secondary | ICD-10-CM | POA: Diagnosis not present

## 2019-09-17 DIAGNOSIS — I10 Essential (primary) hypertension: Secondary | ICD-10-CM | POA: Diagnosis not present

## 2019-09-17 MED ORDER — METOPROLOL TARTRATE 25 MG PO TABS: 25.0000 mg | ORAL_TABLET | Freq: Two times a day (BID) | ORAL | 6 refills | Status: DC

## 2019-09-17 MED ORDER — IRBESARTAN 150 MG PO TABS: 150.0000 mg | ORAL_TABLET | Freq: Every day | ORAL | 6 refills | Status: DC

## 2019-09-17 NOTE — Patient Instructions (Signed)
Medication Instructions:  Your physician has recommended you make the following change in your medication:  1- INCREASE Irbesartan to 150 mg by mouth once a day. 2- DECREASE Metoprolol tartrate to 25 mg by mouth two times a day.   *If you need a refill on your cardiac medications before your next appointment, please call your pharmacy*  Lab Work: none If you have labs (blood work) drawn today and your tests are completely normal, you will receive your results only by: Marland Kitchen MyChart Message (if you have MyChart) OR . A paper copy in the mail If you have any lab test that is abnormal or we need to change your treatment, we will call you to review the results.  Testing/Procedures: Your physician has requested that you have an echocardiogram. Echocardiography is a painless test that uses sound waves to create images of your heart. It provides your doctor with information about the size and shape of your heart and how well your heart's chambers and valves are working. This procedure takes approximately one hour. There are no restrictions for this procedure. You may get an IV, if needed, to receive an ultrasound enhancing agent through to better visualize your heart.    Follow-Up: At [facility], you and your health needs are our priority.  As part of our continuing mission to provide you with exceptional heart care, we have created designated Provider Care Teams.  These Care Teams include your primary Cardiologist (physician) and Advanced Practice Providers (APPs -  Physician Assistants and Nurse Practitioners) who all work together to provide you with the care you need, when you need it.  Your next appointment:   2 months.  The format for your next appointment:   In Person  Provider:    You may see Kate Sable, MD or one of the following Advanced Practice Providers on your designated Care Team:    Murray Hodgkins, NP  Christell Faith, PA-C  Marrianne Mood, PA-C

## 2019-09-17 NOTE — Progress Notes (Signed)
Cardiology Office Note:    Date:  09/17/2019   ID:  Morgan Byrd, DOB 1935/12/25, MRN [id number]  PCP:  Leone Haven, MD  Cardiologist:  Kate Sable, MD  Electrophysiologist:  None   Referring MD: Leone Haven, MD   Chief Complaint  Patient presents with  . New Patient (Initial Visit)    ED F/U-chest tightness-Patient reports SOB with exertion; Meds verbally reviewed with patient.    History of Present Illness:    Morgan Byrd is a 83 y.o. female with a hx of hypertension who presents as a follow-up from the emergency department due to chest tightness.  Patient was seen in the ED 10 days ago because of a 2-day history of central chest tightness.  Patient states symptoms started when she bent over to help her husband put on his shoe.  The chest tightness was not really painful.  He also notes some shortness of breath over the same period with ambulating.  She checks her blood pressure at home frequently and systolic blood pressure is anywhere from 140s to 200s.  Due to her symptoms and elevated blood pressure she presented to the emergency room.  In the emergency room, EKG showed sinus rhythm with frequent PVCs in a bigeminy pattern, chest x-ray showed cardiomegaly with pulmonary vascular prominence but no edema.  Blood pressure was elevated at 161/48.  High-sensitivity troponins were negative, BNP was elevated 497.  Patient was started on ibesartan 75 mg daily.  She was originally already on metoprolol tartrate 50 mg twice daily, hydrochlorothiazide 12.5 daily.  Patient states feeling okay since discharge.  She did not feel any chest discomfort while putting on her husband's shoe today.   Past Medical History:  Diagnosis Date  . Anxiety   . Hypertension     Past Surgical History:  Procedure Laterality Date  . ABDOMINAL HYSTERECTOMY    . BLADDER SUSPENSION    . BREAST BIOPSY Right 1978   neg  . COLONOSCOPY WITH PROPOFOL N/A 12/26/2015   Procedure: COLONOSCOPY  WITH PROPOFOL;  Surgeon: Manya Silvas, MD;  Location[facility] ENDOSCOPY;  Service: Endoscopy;  Laterality: N/A;  . REPLACEMENT TOTAL KNEE BILATERAL    . SHOULDER SURGERY    . VAGINAL DELIVERY     2    Current Medications: Current Meds  Medication Sig  . ALLERGY RELIEF 180 MG tablet TAKE ONE TABLET BY MOUTH EVERY DAY  . aspirin 81 MG EC tablet Take 81 mg by mouth daily. Swallow whole.  . B Complex-C (SUPER B COMPLEX PO) Take by mouth.  Marland Kitchen CALCIUM-MAGNESIUM-ZINC PO Take by mouth.  . cholecalciferol (VITAMIN D) 1000 UNITS tablet Take 1,000 Units by mouth daily.  . fluticasone (FLONASE) 50 MCG/ACT nasal spray TAKE 2 SPRAYS INTO BOTH NOSTRILS DAILY  . hydrochlorothiazide (MICROZIDE) 12.5 MG capsule TAKE 1 CAPSULE EVERY DAY  . irbesartan (AVAPRO) 150 MG tablet Take 1 tablet (150 mg total) by mouth daily.  . metoprolol tartrate (LOPRESSOR) 25 MG tablet Take 1 tablet (25 mg total) by mouth 2 (two) times daily.  . montelukast (SINGULAIR) 10 MG tablet TAKE ONE TABLET AT BEDTIME  . Multiple Vitamin (MULTIVITAMIN) capsule Take 1 capsule by mouth daily.  Marland Kitchen omeprazole (PRILOSEC) 20 MG capsule TAKE 1 CAPSULE BY MOUTH EVERY DAY  . risedronate (ACTONEL) 35 MG tablet Take 1 tablet (35 mg total) by mouth every 7 (seven) days. with water on empty stomach, nothing by mouth or lie down for next 30 minutes.  . [DISCONTINUED]  irbesartan (AVAPRO) 75 MG tablet Take 1 tablet (75 mg total) by mouth daily.  . [DISCONTINUED] metoprolol tartrate (LOPRESSOR) 50 MG tablet TAKE 1 TABLET BY MOUTH TWICE DAILY     Allergies:   Codeine, Fosamax [alendronate sodium], and Lexapro [escitalopram oxalate]   Social History   Socioeconomic History  . Marital status: Married    Spouse name: Not on file  . Number of children: Not on file  . Years of education: Not on file  . Highest education level: Not on file  Occupational History  . Not on file  Social Needs  . Financial resource strain: Not hard at all  . Food  insecurity    Worry: Never true    Inability: Never true  . Transportation needs    Medical: No    Non-medical: No  Tobacco Use  . Smoking status: Former Smoker    Packs/day: 1.00    Years: 4.00    Pack years: 4.00    Types: Cigarettes    Quit date: 1974    Years since quitting: 46.8  . Smokeless tobacco: Never Used  Substance and Sexual Activity  . Alcohol use: No  . Drug use: No  . Sexual activity: Never  Lifestyle  . Physical activity    Days per week: 2 days    Minutes per session: 60 min  . Stress: Not at all  Relationships  . Social Herbalist on phone: Not on file    Gets together: Not on file    Attends religious service: Not on file    Active member of club or organization: Not on file    Attends meetings of clubs or organizations: Not on file    Relationship status: Not on file  Other Topics Concern  . Not on file  Social History Narrative   Lives in [place] with husband. Son and daughter live nearby.      Work - retired Network engineer      Diet - healthy, regular   Exercise - housework     Family History: The patient's family history includes Arthritis in her mother; Breast cancer (age of onset: 55) in her cousin; Breast cancer (age of onset: 24) in her sister; Cancer in her father and sister; Dementia in her mother; Diabetes in her father and sister; Heart disease in her father and sister; Hypertension in her father; Kidney disease in her son; Pneumonia in her son; Vasculitis in her son.  ROS:   Please see the history of present illness.     All other systems reviewed and are negative.  EKGs/Labs/Other Studies Reviewed:    The following studies were reviewed today:   EKG:  EKG is  ordered today.  The ekg ordered today demonstrates sinus bradycardia, heart rate 59, left bundle branch block.  Recent Labs: 04/23/2019: TSH 0.08 07/25/2019: ALT 21 09/08/2019: B Natriuretic Peptide 497.0; BUN 18; Creatinine, Ser 0.90; Hemoglobin 12.6; Platelets 225;  Potassium 3.8; Sodium 140  Recent Lipid Panel    Component Value Date/Time   CHOL 190 07/25/2019 0839   TRIG 93.0 07/25/2019 0839   HDL 65.40 07/25/2019 0839   CHOLHDL 3 07/25/2019 0839   VLDL 18.6 07/25/2019 0839   LDLCALC 106 (H) 07/25/2019 0839   LDLDIRECT 131.8 04/30/2013 0915    Physical Exam:    VS:  BP (!) 150/80 (BP Location: Right Arm, Patient Position: Sitting, Cuff Size: Normal)   Pulse (!) 59   Temp (!) 97.3 F (36.3 C)  Ht 5\' 1"  (1.549 m)   Wt 162 lb 12 oz (73.8 kg)   SpO2 95%   BMI 30.75 kg/m     Wt Readings from Last 3 Encounters:  09/17/19 162 lb 12 oz (73.8 kg)  09/08/19 162 lb (73.5 kg)  07/25/19 168 lb 6.4 oz (76.4 kg)     GEN:  Well nourished, well developed in no acute distress HEENT: Normal NECK: No JVD; No carotid bruits LYMPHATICS: No lymphadenopathy CARDIAC: RRR, [id number] systolic murmur at left sternal border, rubs, gallops RESPIRATORY:  Clear to auscultation without rales, wheezing or rhonchi  ABDOMEN: Soft, non-tender, non-distended MUSCULOSKELETAL:  No edema; No deformity  SKIN: Warm and dry NEUROLOGIC:  Alert and oriented x 3 PSYCHIATRIC:  Normal affect   ASSESSMENT:   Patient still with elevated blood pressure.  The chest discomfort is somewhat atypical with improvement with better blood pressure control.  EKG shows sinus bradycardia with left bundle branch block. 1. Essential hypertension   2. Cardiomegaly   3. Atypical chest pain    PLAN:    In order of problems listed above:  1. Increase irbesartan to 150 mg daily.  Continue HCTZ 12.5 mg daily.  Decrease Lopressor to 25 mg twice daily. 2. Get echocardiogram. 3. Symptoms seems to be improving with better blood pressure management.  We will monitor for now.  Pending echo and better blood pressure control.  If symptoms persist we will consider stress testing.  Total encounter time more than 60 minutes  Greater than 50% was spent in counseling and coordination of care with the  patient   Medication Adjustments/Labs and Tests Ordered: Current medicines are reviewed at length with the patient today.  Concerns regarding medicines are outlined above.  Orders Placed This Encounter  Procedures  . EKG 12-Lead  . ECHOCARDIOGRAM COMPLETE   Meds ordered this encounter  Medications  . irbesartan (AVAPRO) 150 MG tablet    Sig: Take 1 tablet (150 mg total) by mouth daily.    Dispense:  30 tablet    Refill:  6  . metoprolol tartrate (LOPRESSOR) 25 MG tablet    Sig: Take 1 tablet (25 mg total) by mouth 2 (two) times daily.    Dispense:  60 tablet    Refill:  6    Patient Instructions  Medication Instructions:  Your physician has recommended you make the following change in your medication:  1- INCREASE Irbesartan to 150 mg by mouth once a day. 2- DECREASE Metoprolol tartrate to 25 mg by mouth two times a day.   *If you need a refill on your cardiac medications before your next appointment, please call your pharmacy*  Lab Work: none If you have labs (blood work) drawn today and your tests are completely normal, you will receive your results only by: Marland Kitchen MyChart Message (if you have MyChart) OR . A paper copy in the mail If you have any lab test that is abnormal or we need to change your treatment, we will call you to review the results.  Testing/Procedures: Your physician has requested that you have an echocardiogram. Echocardiography is a painless test that uses sound waves to create images of your heart. It provides your doctor with information about the size and shape of your heart and how well your heart's chambers and valves are working. This procedure takes approximately one hour. There are no restrictions for this procedure. You may get an IV, if needed, to receive an ultrasound enhancing agent through to better visualize your heart.  Follow-Up: At [facility], you and your health needs are our priority.  As part of our continuing mission to provide you  with exceptional heart care, we have created designated Provider Care Teams.  These Care Teams include your primary Cardiologist (physician) and Advanced Practice Providers (APPs -  Physician Assistants and Nurse Practitioners) who all work together to provide you with the care you need, when you need it.  Your next appointment:   2 months.  The format for your next appointment:   In Person  Provider:    You may see Kate Sable, MD or one of the following Advanced Practice Providers on your designated Care Team:    Murray Hodgkins, NP  Christell Faith, PA-C  Marrianne Mood, PA-C       Signed, Kate Sable, MD  09/17/2019 11:42 AM    [place]

## 2019-09-28 DIAGNOSIS — J301 Allergic rhinitis due to pollen: Secondary | ICD-10-CM | POA: Diagnosis not present

## 2019-10-01 ENCOUNTER — Other Ambulatory Visit: Payer: Self-pay | Admitting: Cardiology

## 2019-10-01 DIAGNOSIS — R0789 Other chest pain: Secondary | ICD-10-CM

## 2019-10-01 DIAGNOSIS — I517 Cardiomegaly: Secondary | ICD-10-CM

## 2019-10-04 ENCOUNTER — Encounter: Payer: Self-pay | Admitting: Emergency Medicine

## 2019-10-04 ENCOUNTER — Emergency Department
Admission: EM | Admit: 2019-10-04 | Discharge: 2019-10-04 | Payer: PPO | Attending: Emergency Medicine | Admitting: Emergency Medicine

## 2019-10-04 ENCOUNTER — Other Ambulatory Visit: Payer: Self-pay

## 2019-10-04 ENCOUNTER — Emergency Department: Payer: PPO

## 2019-10-04 DIAGNOSIS — Y9301 Activity, walking, marching and hiking: Secondary | ICD-10-CM | POA: Insufficient documentation

## 2019-10-04 DIAGNOSIS — Z87891 Personal history of nicotine dependence: Secondary | ICD-10-CM | POA: Diagnosis not present

## 2019-10-04 DIAGNOSIS — W01198A Fall on same level from slipping, tripping and stumbling with subsequent striking against other object, initial encounter: Secondary | ICD-10-CM | POA: Insufficient documentation

## 2019-10-04 DIAGNOSIS — Z79899 Other long term (current) drug therapy: Secondary | ICD-10-CM | POA: Insufficient documentation

## 2019-10-04 DIAGNOSIS — Y999 Unspecified external cause status: Secondary | ICD-10-CM | POA: Diagnosis not present

## 2019-10-04 DIAGNOSIS — S0033XA Contusion of nose, initial encounter: Secondary | ICD-10-CM | POA: Insufficient documentation

## 2019-10-04 DIAGNOSIS — Z5181 Encounter for therapeutic drug level monitoring: Secondary | ICD-10-CM | POA: Diagnosis not present

## 2019-10-04 DIAGNOSIS — S12100A Unspecified displaced fracture of second cervical vertebra, initial encounter for closed fracture: Secondary | ICD-10-CM

## 2019-10-04 DIAGNOSIS — Y92018 Other place in single-family (private) house as the place of occurrence of the external cause: Secondary | ICD-10-CM | POA: Diagnosis not present

## 2019-10-04 DIAGNOSIS — Z20828 Contact with and (suspected) exposure to other viral communicable diseases: Secondary | ICD-10-CM | POA: Diagnosis not present

## 2019-10-04 DIAGNOSIS — W19XXXA Unspecified fall, initial encounter: Secondary | ICD-10-CM | POA: Diagnosis not present

## 2019-10-04 DIAGNOSIS — Z23 Encounter for immunization: Secondary | ICD-10-CM | POA: Insufficient documentation

## 2019-10-04 DIAGNOSIS — S12120A Other displaced dens fracture, initial encounter for closed fracture: Secondary | ICD-10-CM | POA: Insufficient documentation

## 2019-10-04 DIAGNOSIS — Z96653 Presence of artificial knee joint, bilateral: Secondary | ICD-10-CM | POA: Insufficient documentation

## 2019-10-04 DIAGNOSIS — S299XXA Unspecified injury of thorax, initial encounter: Secondary | ICD-10-CM | POA: Diagnosis not present

## 2019-10-04 DIAGNOSIS — S12110A Anterior displaced Type II dens fracture, initial encounter for closed fracture: Secondary | ICD-10-CM | POA: Diagnosis not present

## 2019-10-04 DIAGNOSIS — S0003XA Contusion of scalp, initial encounter: Secondary | ICD-10-CM | POA: Diagnosis not present

## 2019-10-04 DIAGNOSIS — R0902 Hypoxemia: Secondary | ICD-10-CM | POA: Diagnosis not present

## 2019-10-04 DIAGNOSIS — S022XXA Fracture of nasal bones, initial encounter for closed fracture: Secondary | ICD-10-CM | POA: Diagnosis not present

## 2019-10-04 DIAGNOSIS — S1214XA Type III traumatic spondylolisthesis of second cervical vertebra, initial encounter for closed fracture: Secondary | ICD-10-CM | POA: Diagnosis not present

## 2019-10-04 DIAGNOSIS — J9811 Atelectasis: Secondary | ICD-10-CM | POA: Diagnosis not present

## 2019-10-04 DIAGNOSIS — W010XXA Fall on same level from slipping, tripping and stumbling without subsequent striking against object, initial encounter: Secondary | ICD-10-CM | POA: Diagnosis not present

## 2019-10-04 DIAGNOSIS — S199XXA Unspecified injury of neck, initial encounter: Secondary | ICD-10-CM | POA: Diagnosis present

## 2019-10-04 DIAGNOSIS — S3993XA Unspecified injury of pelvis, initial encounter: Secondary | ICD-10-CM | POA: Diagnosis not present

## 2019-10-04 DIAGNOSIS — R609 Edema, unspecified: Secondary | ICD-10-CM | POA: Diagnosis not present

## 2019-10-04 DIAGNOSIS — S0990XA Unspecified injury of head, initial encounter: Secondary | ICD-10-CM | POA: Diagnosis not present

## 2019-10-04 DIAGNOSIS — Z7982 Long term (current) use of aspirin: Secondary | ICD-10-CM | POA: Diagnosis not present

## 2019-10-04 DIAGNOSIS — I10 Essential (primary) hypertension: Secondary | ICD-10-CM | POA: Insufficient documentation

## 2019-10-04 DIAGNOSIS — S0081XA Abrasion of other part of head, initial encounter: Secondary | ICD-10-CM | POA: Diagnosis not present

## 2019-10-04 LAB — CBC WITH DIFFERENTIAL/PLATELET
Abs Immature Granulocytes: 0.02 10*3/uL (ref 0.00–0.07)
Basophils Absolute: 0.1 10*3/uL (ref 0.0–0.1)
Basophils Relative: 1 %
Eosinophils Absolute: 0.2 10*3/uL (ref 0.0–0.5)
Eosinophils Relative: 3 %
HCT: 40.6 % (ref 36.0–46.0)
Hemoglobin: 13.4 g/dL (ref 12.0–15.0)
Immature Granulocytes: 0 %
Lymphocytes Relative: 17 %
Lymphs Abs: 1.4 10*3/uL (ref 0.7–4.0)
MCH: 31.3 pg (ref 26.0–34.0)
MCHC: 33 g/dL (ref 30.0–36.0)
MCV: 94.9 fL (ref 80.0–100.0)
Monocytes Absolute: 0.5 10*3/uL (ref 0.1–1.0)
Monocytes Relative: 6 %
Neutro Abs: 6 10*3/uL (ref 1.7–7.7)
Neutrophils Relative %: 73 %
Platelets: 258 10*3/uL (ref 150–400)
RBC: 4.28 MIL/uL (ref 3.87–5.11)
RDW: 12.9 % (ref 11.5–15.5)
WBC: 8.2 10*3/uL (ref 4.0–10.5)
nRBC: 0 % (ref 0.0–0.2)

## 2019-10-04 LAB — BASIC METABOLIC PANEL
Anion gap: 10 (ref 5–15)
BUN: 23 mg/dL (ref 8–23)
CO2: 27 mmol/L (ref 22–32)
Calcium: 9.4 mg/dL (ref 8.9–10.3)
Chloride: 102 mmol/L (ref 98–111)
Creatinine, Ser: 0.96 mg/dL (ref 0.44–1.00)
GFR calc Af Amer: >60 mL/min (ref >60)
GFR calc non Af Amer: 56 mL/min — ABNORMAL LOW (ref >60)
Glucose, Bld: 120 mg/dL — ABNORMAL HIGH (ref 70–99)
Potassium: 4 mmol/L (ref 3.5–5.1)
Sodium: 139 mmol/L (ref 135–145)

## 2019-10-04 MED ORDER — OXYCODONE-ACETAMINOPHEN 5-325 MG PO TABS: 1.0000 | ORAL_TABLET | Freq: Once | ORAL | Status: DC

## 2019-10-04 MED ORDER — TETANUS-DIPHTH-ACELL PERTUSSIS 5-2.5-18.5 LF-MCG/0.5 IM SUSP
0.5000 mL | Freq: Once | INTRAMUSCULAR | Status: AC
  Administered 2019-10-04: 0.5 mL via INTRAMUSCULAR
  Filled 2019-10-04: qty 0.5

## 2019-10-04 MED ORDER — MORPHINE SULFATE (PF) 4 MG/ML IV SOLN
4.0000 mg | Freq: Once | INTRAVENOUS | Status: AC
  Administered 2019-10-04: 4 mg via INTRAVENOUS
  Filled 2019-10-04: qty 1

## 2019-10-04 NOTE — ED Notes (Signed)
[place]  ED

## 2019-10-04 NOTE — ED Notes (Signed)
[facility] transfer gave this number [phone] for charge nurse at [facility] ED to call report

## 2019-10-04 NOTE — ED Notes (Signed)
Dr Jessup at bedside 

## 2019-10-04 NOTE — ED Notes (Addendum)
Pt in CT.

## 2019-10-04 NOTE — ED Triage Notes (Addendum)
Tripped and fell approx 30 min ago, hit door facing. Neck pain. Collared on arrival. States did not have LOC. Does have obvious injury to nose. States neck snapped back. States does not take blood thinners except ASA. Patient is alert and oriented with primary pain being in neck.

## 2019-10-04 NOTE — ED Notes (Signed)
Pt given phone to call family.

## 2019-10-04 NOTE — ED Notes (Signed)
Report given to [facility] ED charge nurse

## 2019-10-04 NOTE — Progress Notes (Signed)
Neurosurgery consulted about patient after fall with a C2 fracture and nasal bone fractures. She is neurologically intact. Imaging was concern for a type 3 odontoid fracture but it appears like an extension teardrop fracture. She is in a cervical orthosis and this needs to be maintained. She will need a MRI cervical spine to assess ligaments but given the appearance, I do think this may need surgical fixation and I have recommended transfer for definitive treatment given complexity

## 2019-10-04 NOTE — ED Provider Notes (Signed)
[facility] Emergency Department Provider Note   ____________________________________________   First MD Initiated Contact with Patient 10/04/19 1325     (approximate)  I have reviewed the triage vital signs and the nursing notes.   HISTORY  Chief Complaint Neck Pain    HPI Morgan Byrd is a 83 y.o. female with past medical history of hypertension anxiety presents to the ED following fall.  Patient reports that she tripped on a dog bed just prior to arrival and fell forward, striking her face on the doorway.  She states her head bent backwards and she had immediate onset in the middle of her upper neck that seems to move to either side of her neck.  She did not lose consciousness and is not anticoagulated.  She also complains of pain at the bridge of her nose and had some bleeding from a small wound to the top of her nose.  She denies any headache, vision changes, numbness, or weakness.  She was brought to the ED by family and c-collar was placed shortly after arrival.        Past Medical History:  Diagnosis Date   Anxiety    Hypertension     Patient Active Problem List   Diagnosis Date Noted   Toe injury 07/25/2019   Subclinical hyperthyroidism 05/14/2019   Bronchiectasis ([place]) 05/14/2019   Sciatica 05/14/2019   Thyroid nodule 04/26/2019   Lung nodule 04/05/2019   Bilateral lower extremity edema 02/21/2019   Respiratory illness 02/13/2019   Left shoulder pain 11/13/2018   Femoral bruit 06/01/2018   Allergic rhinitis 05/02/2018   Obesity (BMI 30.0-34.9) 11/03/2017   Anxiety 08/05/2016   Neuropathy 02/02/2016   Dysphagia 10/09/2012   Hypertension 04/25/2012   Low back pain 04/25/2012   Osteoporosis 04/25/2012   Raynaud phenomenon 04/25/2012    Past Surgical History:  Procedure Laterality Date   ABDOMINAL HYSTERECTOMY     BLADDER SUSPENSION     BREAST BIOPSY Right 1978   neg   COLONOSCOPY WITH PROPOFOL N/A  12/26/2015   Procedure: COLONOSCOPY WITH PROPOFOL;  Surgeon: Manya Silvas, MD;  Location: [place];  Service: Endoscopy;  Laterality: N/A;   REPLACEMENT TOTAL KNEE BILATERAL     SHOULDER SURGERY     VAGINAL DELIVERY     2    Prior to Admission medications   Medication Sig Start Date End Date Taking? Authorizing Provider  ALLERGY RELIEF 180 MG tablet TAKE ONE TABLET BY MOUTH EVERY DAY 05/07/19   Leone Haven, MD  aspirin 81 MG EC tablet Take 81 mg by mouth daily. Swallow whole.    [provider]  B Complex-C (SUPER B COMPLEX PO) Take by mouth.    [provider]  CALCIUM-MAGNESIUM-ZINC PO Take by mouth.    [provider]  cetirizine (ZYRTEC) 10 MG tablet Take 10 mg by mouth daily.    [provider]  cholecalciferol (VITAMIN D) 1000 UNITS tablet Take 1,000 Units by mouth daily.    [provider]  fluticasone (FLONASE) 50 MCG/ACT nasal spray TAKE 2 SPRAYS INTO BOTH NOSTRILS DAILY 08/20/19   Guse, Jacquelynn Cree, FNP  hydrochlorothiazide (MICROZIDE) 12.5 MG capsule TAKE 1 CAPSULE EVERY DAY 11/28/18   Leone Haven, MD  irbesartan (AVAPRO) 150 MG tablet Take 1 tablet (150 mg total) by mouth daily. 09/17/19 09/16/20  Kate Sable, MD  metoprolol tartrate (LOPRESSOR) 25 MG tablet Take 1 tablet (25 mg total) by mouth 2 (two) times daily. 09/17/19  Kate Sable, MD  montelukast (SINGULAIR) 10 MG tablet TAKE ONE TABLET AT BEDTIME 04/18/19   Leone Haven, MD  Multiple Vitamin (MULTIVITAMIN) capsule Take 1 capsule by mouth daily.    [provider]  omeprazole (PRILOSEC) 20 MG capsule TAKE 1 CAPSULE BY MOUTH EVERY DAY 05/29/19   Leone Haven, MD  risedronate (ACTONEL) 35 MG tablet Take 1 tablet (35 mg total) by mouth every 7 (seven) days. with water on empty stomach, nothing by mouth or lie down for next 30 minutes. 05/10/19   Leone Haven, MD    Allergies Codeine, Fosamax [alendronate sodium], and Lexapro  [escitalopram oxalate]  Family History  Problem Relation Age of Onset   Dementia Mother    Arthritis Mother    Heart disease Father    Hypertension Father    Cancer Father        prostate   Diabetes Father    Diabetes Sister    Cancer Sister        breast   Breast cancer Sister 6   Heart disease Sister    Vasculitis Son    Kidney disease Son    Pneumonia Son    Breast cancer Cousin 62    Social History Social History   Tobacco Use   Smoking status: Former Smoker    Packs/day: 1.00    Years: 4.00    Pack years: 4.00    Types: Cigarettes    Quit date: 1974    Years since quitting: 46.9   Smokeless tobacco: Never Used  Substance Use Topics   Alcohol use: No   Drug use: No    Review of Systems  Constitutional: No fever/chills Eyes: No visual changes. ENT: No sore throat.  Positive for neck pain. Cardiovascular: Denies chest pain. Respiratory: Denies shortness of breath. Gastrointestinal: No abdominal pain.  No nausea, no vomiting.  No diarrhea.  No constipation. Genitourinary: Negative for dysuria. Musculoskeletal: Negative for back pain. Skin: Negative for rash. Neurological: Negative for headaches, focal weakness or numbness.  ____________________________________________   PHYSICAL EXAM:  VITAL SIGNS: ED Triage Vitals  Enc Vitals Group     BP 10/04/19 1307 (!) 144/74     Pulse Rate 10/04/19 1307 64     Resp 10/04/19 1307 20     Temp 10/04/19 1307 97.8 F (36.6 C)     Temp Source 10/04/19 1307 Oral     SpO2 10/04/19 1307 95 %     Weight 10/04/19 1309 157 lb (71.2 kg)     Height 10/04/19 1309 5\' 1"  (1.549 m)     Head Circumference --      Peak Flow --      Pain Score 10/04/19 1309 5     Pain Loc --      Pain Edu? --      Excl. in [place]? --     Constitutional: Alert and oriented. Eyes: Conjunctivae are normal. Head: Hematoma noted to right frontal scalp with no step-offs. Nose: No congestion/rhinnorhea.  Obvious deformity to  nasal bridge with overlying abrasion, no active bleeding.  Small septal hematoma noted on right. Mouth/Throat: Mucous membranes are moist. Neck: Cervical collar in place, upper midline cervical spine tenderness. Cardiovascular: Normal rate, regular rhythm. Grossly normal heart sounds. Respiratory: Normal respiratory effort.  No retractions. Lungs CTAB. Gastrointestinal: Soft and nontender. No distention. Genitourinary: deferred Musculoskeletal: No lower extremity tenderness nor edema. Neurologic:  Normal speech and language. No gross focal neurologic deficits are appreciated.  5-5 strength in  bilateral upper and lower extremities, no pronator drift. Skin:  Skin is warm, dry and intact. No rash noted. Psychiatric: Mood and affect are normal. Speech and behavior are normal.  ____________________________________________   LABS (all labs ordered are listed, but only abnormal results are displayed)  Labs Reviewed  CBC WITH DIFFERENTIAL/PLATELET  BASIC METABOLIC PANEL     PROCEDURES  Procedure(s) performed (including Critical Care):  .Critical Care Performed by: Blake Divine, MD Authorized by: Blake Divine, MD   Critical care provider statement:    Critical care time (minutes):  45   Critical care time was exclusive of:  Separately billable procedures and treating other patients and teaching time   Critical care was necessary to treat or prevent imminent or life-threatening deterioration of the following conditions:  Trauma   Critical care was time spent personally by me on the following activities:  Discussions with consultants, evaluation of patient's response to treatment, examination of patient, ordering and performing treatments and interventions, ordering and review of laboratory studies, ordering and review of radiographic studies, pulse oximetry, re-evaluation of patient's condition, obtaining history from patient or surrogate and review of old charts   I assumed direction  of critical care for this patient from another provider in my specialty: no       ____________________________________________   INITIAL IMPRESSION / ASSESSMENT AND PLAN / ED COURSE       83 year old female with history of hypertension presents to the ED following mechanical fall where she tripped on a dog bed and fell face first into the doorway.  Her head bent backwards and she had immediate onset of midline neck pain, but denies any numbness or weakness.  She is neurologically intact on my exam, but continues to complain of significant neck pain.  CT of her cervical spine shows type III odontoid fracture that is minimally displaced.  CT head negative but maxillofacial CT does show nasal bone fractures as well as septal hematoma.  Patient was placed in c-collar immediately upon arrival to the ED.  Case was discussed with Dr. Lacinda Axon of neurosurgery, who recommends transfer for definitive care and patient states preference of transfer to [facility].  Her septal hematoma may be managed following transfer.  Case discussed with [facility] transfer center and patient accepted to trauma service under Dr. Doyne Keel.  She remained stable with no neurologic deficits at the time of transfer.      ____________________________________________   FINAL CLINICAL IMPRESSION(S) / ED DIAGNOSES  Final diagnoses:  Closed odontoid fracture, initial encounter [facility])  Closed fracture of nasal bone, initial encounter  Hematoma of nasal septum, initial encounter  Fall, initial encounter     ED Discharge Orders    None       Note:  This document was prepared using Dragon voice recognition software and may include unintentional dictation errors.   Blake Divine, MD 10/04/19 [phone]

## 2019-10-05 ENCOUNTER — Other Ambulatory Visit: Payer: Self-pay | Admitting: Family Medicine

## 2019-10-05 DIAGNOSIS — S1214XA Type III traumatic spondylolisthesis of second cervical vertebra, initial encounter for closed fracture: Secondary | ICD-10-CM | POA: Diagnosis not present

## 2019-10-05 DIAGNOSIS — S022XXA Fracture of nasal bones, initial encounter for closed fracture: Secondary | ICD-10-CM | POA: Diagnosis not present

## 2019-10-05 DIAGNOSIS — W19XXXA Unspecified fall, initial encounter: Secondary | ICD-10-CM | POA: Diagnosis not present

## 2019-10-05 MED ORDER — GENERIC EXTERNAL MEDICATION
5.00 | Status: DC
Start: 2019-10-05 — End: 2019-10-05

## 2019-10-09 DIAGNOSIS — S022XXA Fracture of nasal bones, initial encounter for closed fracture: Secondary | ICD-10-CM | POA: Diagnosis not present

## 2019-10-10 DIAGNOSIS — S022XXA Fracture of nasal bones, initial encounter for closed fracture: Secondary | ICD-10-CM | POA: Diagnosis not present

## 2019-10-10 DIAGNOSIS — Y9301 Activity, walking, marching and hiking: Secondary | ICD-10-CM | POA: Diagnosis not present

## 2019-10-10 DIAGNOSIS — W010XXA Fall on same level from slipping, tripping and stumbling without subsequent striking against object, initial encounter: Secondary | ICD-10-CM | POA: Diagnosis not present

## 2019-10-11 ENCOUNTER — Other Ambulatory Visit: Payer: PPO

## 2019-10-11 DIAGNOSIS — I10 Essential (primary) hypertension: Secondary | ICD-10-CM | POA: Diagnosis not present

## 2019-10-11 DIAGNOSIS — Z20828 Contact with and (suspected) exposure to other viral communicable diseases: Secondary | ICD-10-CM | POA: Diagnosis not present

## 2019-10-11 DIAGNOSIS — M810 Age-related osteoporosis without current pathological fracture: Secondary | ICD-10-CM | POA: Diagnosis not present

## 2019-10-11 DIAGNOSIS — Z8781 Personal history of (healed) traumatic fracture: Secondary | ICD-10-CM

## 2019-10-11 DIAGNOSIS — S022XXA Fracture of nasal bones, initial encounter for closed fracture: Secondary | ICD-10-CM | POA: Diagnosis not present

## 2019-10-11 DIAGNOSIS — E0590 Thyrotoxicosis, unspecified without thyrotoxic crisis or storm: Secondary | ICD-10-CM | POA: Diagnosis not present

## 2019-10-11 DIAGNOSIS — W19XXXA Unspecified fall, initial encounter: Secondary | ICD-10-CM | POA: Diagnosis not present

## 2019-10-11 DIAGNOSIS — E042 Nontoxic multinodular goiter: Secondary | ICD-10-CM | POA: Diagnosis not present

## 2019-10-11 HISTORY — DX: Personal history of (healed) traumatic fracture: Z87.81

## 2019-10-15 DIAGNOSIS — E0590 Thyrotoxicosis, unspecified without thyrotoxic crisis or storm: Secondary | ICD-10-CM | POA: Diagnosis not present

## 2019-10-15 DIAGNOSIS — I447 Left bundle-branch block, unspecified: Secondary | ICD-10-CM | POA: Diagnosis not present

## 2019-10-15 DIAGNOSIS — I10 Essential (primary) hypertension: Secondary | ICD-10-CM | POA: Diagnosis not present

## 2019-10-15 DIAGNOSIS — E785 Hyperlipidemia, unspecified: Secondary | ICD-10-CM | POA: Diagnosis not present

## 2019-10-15 DIAGNOSIS — Z780 Asymptomatic menopausal state: Secondary | ICD-10-CM | POA: Diagnosis not present

## 2019-10-15 DIAGNOSIS — Z87891 Personal history of nicotine dependence: Secondary | ICD-10-CM | POA: Diagnosis not present

## 2019-10-15 DIAGNOSIS — M810 Age-related osteoporosis without current pathological fracture: Secondary | ICD-10-CM | POA: Diagnosis not present

## 2019-10-15 DIAGNOSIS — R9431 Abnormal electrocardiogram [ECG] [EKG]: Secondary | ICD-10-CM | POA: Diagnosis not present

## 2019-10-15 DIAGNOSIS — S022XXA Fracture of nasal bones, initial encounter for closed fracture: Secondary | ICD-10-CM | POA: Diagnosis not present

## 2019-10-15 DIAGNOSIS — F419 Anxiety disorder, unspecified: Secondary | ICD-10-CM | POA: Diagnosis not present

## 2019-10-15 DIAGNOSIS — Z79899 Other long term (current) drug therapy: Secondary | ICD-10-CM | POA: Diagnosis not present

## 2019-10-15 DIAGNOSIS — Z20828 Contact with and (suspected) exposure to other viral communicable diseases: Secondary | ICD-10-CM | POA: Diagnosis not present

## 2019-10-15 DIAGNOSIS — W108XXA Fall (on) (from) other stairs and steps, initial encounter: Secondary | ICD-10-CM | POA: Diagnosis not present

## 2019-10-15 DIAGNOSIS — Z90710 Acquired absence of both cervix and uterus: Secondary | ICD-10-CM | POA: Diagnosis not present

## 2019-10-22 ENCOUNTER — Telehealth: Payer: Self-pay

## 2019-10-22 NOTE — Telephone Encounter (Signed)
Copied from [place] [id number]. Topic: General - Other >> Oct 22, 2019  8:31 AM Rainey Pines A wrote: Patient would like a callback from Dr. Corie Chiquito nurse in regards to medication for anxiety from the medical device patient has to wear from breaking nose. Please advise

## 2019-10-23 DIAGNOSIS — M47812 Spondylosis without myelopathy or radiculopathy, cervical region: Secondary | ICD-10-CM | POA: Diagnosis not present

## 2019-10-23 DIAGNOSIS — W01198D Fall on same level from slipping, tripping and stumbling with subsequent striking against other object, subsequent encounter: Secondary | ICD-10-CM | POA: Diagnosis not present

## 2019-10-23 DIAGNOSIS — S022XXD Fracture of nasal bones, subsequent encounter for fracture with routine healing: Secondary | ICD-10-CM | POA: Diagnosis not present

## 2019-10-23 DIAGNOSIS — F419 Anxiety disorder, unspecified: Secondary | ICD-10-CM | POA: Diagnosis not present

## 2019-10-23 DIAGNOSIS — S12190D Other displaced fracture of second cervical vertebra, subsequent encounter for fracture with routine healing: Secondary | ICD-10-CM | POA: Diagnosis not present

## 2019-10-23 DIAGNOSIS — Z87891 Personal history of nicotine dependence: Secondary | ICD-10-CM | POA: Diagnosis not present

## 2019-10-23 DIAGNOSIS — S12100D Unspecified displaced fracture of second cervical vertebra, subsequent encounter for fracture with routine healing: Secondary | ICD-10-CM | POA: Diagnosis not present

## 2019-10-23 DIAGNOSIS — W010XXD Fall on same level from slipping, tripping and stumbling without subsequent striking against object, subsequent encounter: Secondary | ICD-10-CM | POA: Diagnosis not present

## 2019-10-25 ENCOUNTER — Other Ambulatory Visit: Payer: Self-pay

## 2019-10-25 NOTE — Telephone Encounter (Signed)
Patient fell and broke her nose and fractured her neck, She called because she has not been on Xanax in a while and she wanted you to send a RX for Xanax to her pharmacy, She has been going to [facility] for all this and they did prescribe Xanax but they only gave her 5 pills until she spoke to you. She only wants a 1 month supply because it has been helping her through all this. She would like for it to go to [facility].   Please advise.  Morgan Byrd,cma

## 2019-11-14 ENCOUNTER — Ambulatory Visit (INDEPENDENT_AMBULATORY_CARE_PROVIDER_SITE_OTHER): Payer: PPO | Admitting: Family Medicine

## 2019-11-14 ENCOUNTER — Encounter: Payer: Self-pay | Admitting: Family Medicine

## 2019-11-14 ENCOUNTER — Other Ambulatory Visit: Payer: Self-pay

## 2019-11-14 DIAGNOSIS — M8000XA Age-related osteoporosis with current pathological fracture, unspecified site, initial encounter for fracture: Secondary | ICD-10-CM | POA: Diagnosis not present

## 2019-11-14 DIAGNOSIS — I498 Other specified cardiac arrhythmias: Secondary | ICD-10-CM

## 2019-11-14 DIAGNOSIS — R12 Heartburn: Secondary | ICD-10-CM | POA: Insufficient documentation

## 2019-11-14 DIAGNOSIS — I10 Essential (primary) hypertension: Secondary | ICD-10-CM

## 2019-11-14 DIAGNOSIS — S12190A Other displaced fracture of second cervical vertebra, initial encounter for closed fracture: Secondary | ICD-10-CM | POA: Diagnosis not present

## 2019-11-14 DIAGNOSIS — M170 Bilateral primary osteoarthritis of knee: Secondary | ICD-10-CM | POA: Insufficient documentation

## 2019-11-14 DIAGNOSIS — S022XXA Fracture of nasal bones, initial encounter for closed fracture: Secondary | ICD-10-CM | POA: Diagnosis not present

## 2019-11-14 DIAGNOSIS — S12100A Unspecified displaced fracture of second cervical vertebra, initial encounter for closed fracture: Secondary | ICD-10-CM

## 2019-11-14 DIAGNOSIS — F419 Anxiety disorder, unspecified: Secondary | ICD-10-CM | POA: Insufficient documentation

## 2019-11-14 DIAGNOSIS — R52 Pain, unspecified: Secondary | ICD-10-CM | POA: Insufficient documentation

## 2019-11-14 DIAGNOSIS — M25569 Pain in unspecified knee: Secondary | ICD-10-CM | POA: Insufficient documentation

## 2019-11-14 DIAGNOSIS — M1991 Primary osteoarthritis, unspecified site: Secondary | ICD-10-CM | POA: Insufficient documentation

## 2019-11-14 DIAGNOSIS — I879 Disorder of vein, unspecified: Secondary | ICD-10-CM | POA: Insufficient documentation

## 2019-11-14 DIAGNOSIS — M1710 Unilateral primary osteoarthritis, unspecified knee: Secondary | ICD-10-CM | POA: Insufficient documentation

## 2019-11-14 DIAGNOSIS — M179 Osteoarthritis of knee, unspecified: Secondary | ICD-10-CM | POA: Insufficient documentation

## 2019-11-14 DIAGNOSIS — N6019 Diffuse cystic mastopathy of unspecified breast: Secondary | ICD-10-CM | POA: Insufficient documentation

## 2019-11-14 HISTORY — DX: Fracture of nasal bones, initial encounter for closed fracture: S02.2XXA

## 2019-11-14 HISTORY — DX: Unspecified displaced fracture of second cervical vertebra, initial encounter for closed fracture: S12.100A

## 2019-11-14 NOTE — Assessment & Plan Note (Signed)
Asymptomatic.  She will continue her current medication regimen.  She will see cardiology as planned.

## 2019-11-14 NOTE — Assessment & Plan Note (Signed)
Patient with current traumatic fractures.  Discussed the importance of continuing on Actonel and vitamin D and calcium.

## 2019-11-14 NOTE — Assessment & Plan Note (Signed)
New issue.  She is following with orthopedics for this.  She is remaining in a cervical collar.  She notes no discomfort or neurological issues related to this.  She will continue her cervical collar until orthopedics releases her from wearing it.

## 2019-11-14 NOTE — Progress Notes (Addendum)
Virtual Visit via telephone Note  This visit type was conducted due to national recommendations for restrictions regarding the COVID-19 pandemic (e.g. social distancing).  This format is felt to be most appropriate for this patient at this time.  All issues noted in this document were discussed and addressed.  No physical exam was performed (except for noted visual exam findings with Video Visits).   I connected with Morgan Byrd today at 10:00 AM EST by telephone and verified that I am speaking with the correct person using two identifiers. Location patient: home Location provider: work Persons participating in the virtual visit: patient, provider, Pension scheme manager (husband)  I discussed the limitations, risks, security and privacy concerns of performing an evaluation and management service by telephone and the availability of in person appointments. I also discussed with the patient that there may be a patient responsible charge related to this service. The patient expressed understanding and agreed to proceed.  Interactive audio and video telecommunications were attempted between this provider and patient, however failed, due to patient having technical difficulties OR patient did not have access to video capability.  We continued and completed visit with audio only.   Reason for visit: follow-up  HPI: HYPERTENSION  Disease Monitoring  Home BP Monitoring not checking Chest pain- no    Dyspnea- no Medications  Compliance-  Taking HCTZ, irbesartan, metoprolol    palpitations-no Found to have bigeminy during ED visit.  Medications were altered at that time.  She has not had any palpitations.  She did undergo echo at [facility].  She is seeing cardiology.  Osteoporosis: See below for recent fracture history.  She is on Actonel.  No issues swallowing.  She continues on Citracal as well.  Fall: Patient tripped over a dog bed on Thanksgiving day.  She fell with her face into a door frame.  She ended  up being evaluated and had a nasal fracture with a nasal hematoma and also a C2 fracture.  She was evaluated in the ED for this and transferred to [facility].  She was cleared by orthopedics for home follow-up.  She has not needed surgery for her neck though did have rhinoplasty recently and notes her nose feels well at this time.  She is still wearing her cervical collar.  No neck pain.  No numbness or weakness.  She may be able to come out of the cervical collar per her report at 8 to 10 weeks post injury.  She does note it is easy to cry with all that has happened with this and with Covid though notes no depression.     ROS: See pertinent positives and negatives per HPI.  Past Medical History:  Diagnosis Date  . Anxiety   . Hypertension     Past Surgical History:  Procedure Laterality Date  . ABDOMINAL HYSTERECTOMY    . BLADDER SUSPENSION    . BREAST BIOPSY Right 1978   neg  . COLONOSCOPY WITH PROPOFOL N/A 12/26/2015   Procedure: COLONOSCOPY WITH PROPOFOL;  Surgeon: Manya Silvas, MD;  Location: [facility] ENDOSCOPY;  Service: Endoscopy;  Laterality: N/A;  . REPLACEMENT TOTAL KNEE BILATERAL    . SHOULDER SURGERY    . VAGINAL DELIVERY     2    Family History  Problem Relation Age of Onset  . Dementia Mother   . Arthritis Mother   . Heart disease Father   . Hypertension Father   . Cancer Father        prostate  .  Diabetes Father   . Diabetes Sister   . Cancer Sister        breast  . Breast cancer Sister 65  . Heart disease Sister   . Vasculitis Son   . Kidney disease Son   . Pneumonia Son   . Breast cancer Cousin 85    SOCIAL HX: Former smoker   Current Outpatient Medications:  .  ALLERGY RELIEF 180 MG tablet, TAKE ONE TABLET BY MOUTH EVERY DAY, Disp: 90 tablet, Rfl: 1 .  ALPRAZolam (XANAX) 0.25 MG tablet, Take by mouth., Disp: , Rfl:  .  aspirin 81 MG EC tablet, Take 81 mg by mouth daily. Swallow whole., Disp: , Rfl:  .  B Complex-C (SUPER B COMPLEX PO), Take by mouth.,  Disp: , Rfl:  .  CALCIUM-MAGNESIUM-ZINC PO, Take by mouth., Disp: , Rfl:  .  cetirizine (ZYRTEC) 10 MG tablet, Take 10 mg by mouth daily., Disp: , Rfl:  .  cholecalciferol (VITAMIN D) 1000 UNITS tablet, Take 1,000 Units by mouth daily., Disp: , Rfl:  .  fluticasone (FLONASE) 50 MCG/ACT nasal spray, TAKE 2 SPRAYS INTO BOTH NOSTRILS DAILY, Disp: 16 g, Rfl: 1 .  hydrochlorothiazide (MICROZIDE) 12.5 MG capsule, TAKE 1 CAPSULE EVERY DAY, Disp: 90 capsule, Rfl: 3 .  irbesartan (AVAPRO) 150 MG tablet, Take 1 tablet (150 mg total) by mouth daily., Disp: 30 tablet, Rfl: 6 .  metoprolol tartrate (LOPRESSOR) 25 MG tablet, Take 1 tablet (25 mg total) by mouth 2 (two) times daily., Disp: 60 tablet, Rfl: 6 .  montelukast (SINGULAIR) 10 MG tablet, TAKE ONE TABLET AT BEDTIME, Disp: 90 tablet, Rfl: 1 .  Multiple Vitamin (MULTIVITAMIN) capsule, Take 1 capsule by mouth daily., Disp: , Rfl:  .  omeprazole (PRILOSEC) 20 MG capsule, TAKE 1 CAPSULE EVERY DAY, Disp: 90 capsule, Rfl: 1 .  risedronate (ACTONEL) 35 MG tablet, Take 1 tablet (35 mg total) by mouth every 7 (seven) days. with water on empty stomach, nothing by mouth or lie down for next 30 minutes., Disp: 12 tablet, Rfl: 1 .  methimazole (TAPAZOLE) 5 MG tablet, Take 2.5 mg by mouth daily., Disp: , Rfl:   EXAM: This was a telehealth telephone visit and thus no physical exam was completed.   ASSESSMENT AND PLAN:  Discussed the following assessment and plan:  C2 cervical fracture ([place]) New issue.  She is following with orthopedics for this.  She is remaining in a cervical collar.  She notes no discomfort or neurological issues related to this.  She will continue her cervical collar until orthopedics releases her from wearing it.  Osteoporosis Patient with current traumatic fractures.  Discussed the importance of continuing on Actonel and vitamin D and calcium.  Hypertension Discussed started to check her blood pressure daily and getting [place] her readings.   She will continue her current regimen and if needed we can alter her regimen based on her home blood pressures.  She will follow up with cardiology as planned regarding her bigeminy.  Bigeminy Asymptomatic.  She will continue her current medication regimen.  She will see cardiology as planned.  Nasal fracture Status post surgical intervention for this.  She is doing well.   No orders of the defined types were placed in this encounter.   No orders of the defined types were placed in this encounter.    I discussed the assessment and treatment plan with the patient. The patient was provided an opportunity to ask questions and all were answered. The patient  agreed with the plan and demonstrated an understanding of the instructions.   The patient was advised to call back or seek an in-person evaluation if the symptoms worsen or if the condition fails to improve as anticipated.  I provided 14 minutes of non-face-to-face time during this encounter.   Tommi Rumps, MD

## 2019-11-14 NOTE — Assessment & Plan Note (Signed)
Discussed started to check her blood pressure daily and getting [place] her readings.  She will continue her current regimen and if needed we can alter her regimen based on her home blood pressures.  She will follow up with cardiology as planned regarding her bigeminy.

## 2019-11-14 NOTE — Assessment & Plan Note (Signed)
Status post surgical intervention for this.  She is doing well.

## 2019-11-20 ENCOUNTER — Other Ambulatory Visit: Payer: Self-pay | Admitting: Family Medicine

## 2019-11-21 ENCOUNTER — Telehealth: Payer: Self-pay | Admitting: Family Medicine

## 2019-11-21 ENCOUNTER — Emergency Department: Payer: PPO

## 2019-11-21 ENCOUNTER — Other Ambulatory Visit: Payer: Self-pay

## 2019-11-21 ENCOUNTER — Emergency Department
Admission: EM | Admit: 2019-11-21 | Discharge: 2019-11-21 | Disposition: A | Discharge status: Home / Self Care | Payer: PPO | Attending: Emergency Medicine | Admitting: Emergency Medicine

## 2019-11-21 ENCOUNTER — Encounter: Payer: Self-pay | Admitting: Emergency Medicine

## 2019-11-21 ENCOUNTER — Ambulatory Visit
Admission: EM | Admit: 2019-11-21 | Discharge: 2019-11-21 | Disposition: A | Discharge status: Home / Self Care | Payer: PPO | Source: Home / Self Care | Attending: Emergency Medicine | Admitting: Emergency Medicine

## 2019-11-21 DIAGNOSIS — Z79899 Other long term (current) drug therapy: Secondary | ICD-10-CM | POA: Diagnosis not present

## 2019-11-21 DIAGNOSIS — R252 Cramp and spasm: Secondary | ICD-10-CM | POA: Insufficient documentation

## 2019-11-21 DIAGNOSIS — M7989 Other specified soft tissue disorders: Secondary | ICD-10-CM | POA: Insufficient documentation

## 2019-11-21 DIAGNOSIS — Z7982 Long term (current) use of aspirin: Secondary | ICD-10-CM | POA: Insufficient documentation

## 2019-11-21 DIAGNOSIS — M79605 Pain in left leg: Secondary | ICD-10-CM | POA: Diagnosis present

## 2019-11-21 DIAGNOSIS — Z87891 Personal history of nicotine dependence: Secondary | ICD-10-CM | POA: Diagnosis not present

## 2019-11-21 LAB — FIBRIN DERIVATIVES D-DIMER (ARMC ONLY): Fibrin derivatives D-dimer (ARMC): 752.91 ng{FEU}/mL — ABNORMAL HIGH (ref 0.00–499.00)

## 2019-11-21 MED ORDER — METHOCARBAMOL 500 MG PO TABS: 500.0000 mg | ORAL_TABLET | Freq: Every evening | ORAL | 0 refills | Status: DC | PRN

## 2019-11-21 NOTE — Discharge Instructions (Addendum)
I will call you with the D-dimer results.  If they are positive then you will need to go to the ER to rule out a DVT.  It is negative, the do not have a blood clot in your leg, I would continue wearing compression hose, you can use heat or ice, whatever feels better, 502,000 mg of Tylenol 3 times a day as needed for pain, gentle stretching.

## 2019-11-21 NOTE — ED Provider Notes (Signed)
[facility] Emergency Department Provider Note  ____________________________________________   First MD Initiated Contact with Patient 11/21/19 1937     (approximate)  I have reviewed the triage vital signs and the nursing notes.   HISTORY  Chief Complaint Leg Pain    HPI Morgan Byrd is a 84 y.o. female here for evaluation of left calf twitching and pain earlier today  Patient reports that she noticed her left outside calf felt like it was twitching or spasming earlier.  That is now gone away.  She went and saw her doctor, was seen and evaluated and referred to urgent care who then saw her but did not have ultrasound capability, they did a D-dimer that she reports that was elevated and they recommended she come to get an ultrasound to make sure she did have a DVT  Denies cold or blue foot.  No significant pain or discomfort other than the fellow who was twitching earlier which is now gone away.  She denies any recent falls or injury except on Thanksgiving she fell and suffered a C2 fracture, but that is been healing well and she is in a brace for that  No difficulty walking.  No history of prior blood clots or DVTs.  She feels well at this time, reports that she would like to build to go home soon hopefully her results will be back   Past Medical History:  Diagnosis Date  . Anxiety   . Hypertension     Patient Active Problem List   Diagnosis Date Noted  . Chronic anxiety 11/14/2019  . Degenerative arthritis of knee, bilateral 11/14/2019  . Disorder of vein 11/14/2019  . Fibrocystic breast disease 11/14/2019  . Heartburn 11/14/2019  . Knee pain 11/14/2019  . Localized, primary osteoarthritis 11/14/2019  . Pain 11/14/2019  . Osteoarthritis of knee 11/14/2019  . C2 cervical fracture ([place]) 11/14/2019  . Bigeminy 11/14/2019  . Nasal fracture 11/14/2019  . History of closed fracture of nasal bones 10/11/2019  . Toe injury 07/25/2019  .  Multinodular goiter 06/07/2019  . Subclinical hyperthyroidism 05/14/2019  . Bronchiectasis ([place]) 05/14/2019  . Sciatica 05/14/2019  . Thyroid nodule 04/26/2019  . Lung nodule 04/05/2019  . Bilateral lower extremity edema 02/21/2019  . Respiratory illness 02/13/2019  . Full thickness rotator cuff tear 01/02/2019  . Left shoulder pain 11/13/2018  . Femoral bruit 06/01/2018  . Allergic rhinitis 05/02/2018  . Obesity (BMI 30.0-34.9) 11/03/2017  . Anxiety 08/05/2016  . Neuropathy 02/02/2016  . Dysphagia 10/09/2012  . Hypertension 04/25/2012  . Low back pain 04/25/2012  . Osteoporosis 04/25/2012  . Raynaud phenomenon 04/25/2012    Past Surgical History:  Procedure Laterality Date  . ABDOMINAL HYSTERECTOMY    . BLADDER SUSPENSION    . BREAST BIOPSY Right 1978   neg  . COLONOSCOPY WITH PROPOFOL N/A 12/26/2015   Procedure: COLONOSCOPY WITH PROPOFOL;  Surgeon: Manya Silvas, MD;  Location: [facility] ENDOSCOPY;  Service: Endoscopy;  Laterality: N/A;  . REPLACEMENT TOTAL KNEE BILATERAL    . SHOULDER SURGERY    . VAGINAL DELIVERY     2    Prior to Admission medications   Medication Sig Start Date End Date Taking? Authorizing Provider  ALLERGY RELIEF 180 MG tablet TAKE ONE TABLET BY MOUTH EVERY DAY 05/07/19   Leone Haven, MD  ALPRAZolam Duanne Moron) 0.25 MG tablet Take by mouth. 10/23/19 11/22/19  [provider]  aspirin 81 MG EC tablet Take 81 mg by mouth daily.  Swallow whole.    [provider]  B Complex-C (SUPER B COMPLEX PO) Take by mouth.    [provider]  CALCIUM-MAGNESIUM-ZINC PO Take by mouth.    [provider]  cetirizine (ZYRTEC) 10 MG tablet Take 10 mg by mouth daily.    [provider]  cholecalciferol (VITAMIN D) 1000 UNITS tablet Take 1,000 Units by mouth daily.    [provider]  fluticasone (FLONASE) 50 MCG/ACT nasal spray TAKE 2 SPRAYS INTO BOTH NOSTRILS DAILY 08/20/19   Guse, Jacquelynn Cree, FNP  hydrochlorothiazide  (MICROZIDE) 12.5 MG capsule TAKE 1 CAPSULE EVERY DAY 11/28/18   Leone Haven, MD  irbesartan (AVAPRO) 150 MG tablet Take 1 tablet (150 mg total) by mouth daily. 09/17/19 09/16/20  Kate Sable, MD  methimazole (TAPAZOLE) 5 MG tablet Take 2.5 mg by mouth daily. 10/25/19   [provider]  metoprolol tartrate (LOPRESSOR) 25 MG tablet Take 1 tablet (25 mg total) by mouth 2 (two) times daily. 09/17/19   Kate Sable, MD  montelukast (SINGULAIR) 10 MG tablet TAKE ONE TABLET AT BEDTIME 04/18/19   Leone Haven, MD  Multiple Vitamin (MULTIVITAMIN) capsule Take 1 capsule by mouth daily.    [provider]  omeprazole (PRILOSEC) 20 MG capsule TAKE 1 CAPSULE EVERY DAY 10/08/19   Leone Haven, MD  risedronate (ACTONEL) 35 MG tablet TAKE 1 TABLET EVERY 7 DAYS WITH A FULL GLASS OF WATER ON AN EMPTY STOMACH DO NOT LIE DOWN FOR AT LEAST 30 MIN 11/21/19   Leone Haven, MD    Allergies Codeine, Fosamax [alendronate sodium], and Lexapro [escitalopram oxalate]  Family History  Problem Relation Age of Onset  . Dementia Mother   . Arthritis Mother   . Heart disease Father   . Hypertension Father   . Cancer Father        prostate  . Diabetes Father   . Diabetes Sister   . Cancer Sister        breast  . Breast cancer Sister 29  . Heart disease Sister   . Vasculitis Son   . Kidney disease Son   . Pneumonia Son   . Breast cancer Cousin 62    Social History Social History   Tobacco Use  . Smoking status: Former Smoker    Packs/day: 1.00    Years: 4.00    Pack years: 4.00    Types: Cigarettes    Quit date: 1974    Years since quitting: 47.0  . Smokeless tobacco: Never Used  Substance Use Topics  . Alcohol use: No  . Drug use: No    Review of Systems Constitutional: No fever/chills Cardiovascular: Denies chest pain. Respiratory: Denies shortness of breath. Musculoskeletal: Negative for back pain.  Recent C2 fracture.  See HPI regarding left  leg.  No issues with the right leg no swelling in the right leg.  Not really seeing any swelling in her left leg either, but reports marked twitching discomfort is now completely gone away. Skin: Negative for rash. Neurological: Negative for areas of focal weakness or numbness.    ____________________________________________   PHYSICAL EXAM:  VITAL SIGNS: ED Triage Vitals [11/21/19 1643]  Enc Vitals Group     BP (!) 166/62     Pulse Rate 69     Resp 18     Temp 98 F (36.7 C)     Temp Source Oral     SpO2 93 %     Weight 158 lb (  71.7 kg)     Height 5\' 1"  (1.549 m)     Head Circumference      Peak Flow      Pain Score 0     Pain Loc      Pain Edu?      Excl. in [place]?     Constitutional: Alert and oriented. Well appearing and in no acute distress. Eyes: Conjunctivae are normal. Head: Atraumatic.  Wearing cervical collar appropriately. Nose: No congestion/rhinnorhea. Mouth/Throat: Mucous membranes are moist. Neck: No stridor.  Cardiovascular: Normal rate, regular rhythm.  Strong dorsalis pedis pulses and posterior tibial pulses palpable bilaterally.  No ischemic changes in the feet or toes bilateral.  Respiratory: Normal respiratory effort.   Musculoskeletal: No lower extremity tenderness nor edema.  Full range of motion of both lower extremities.  She has previous bilateral knee replacements.  There is no noted swelling or edema.  No venous cords or congestion bilateral.  Of note patient reports her symptoms have abated. Neurologic:  Normal speech and language. No gross focal neurologic deficits are appreciated.  Skin:  Skin is warm, dry and intact. No rash noted. Psychiatric: Mood and affect are normal. Speech and behavior are normal.  ____________________________________________   LABS (all labs ordered are listed, but only abnormal results are displayed)  Labs Reviewed - No data to  display ____________________________________________  EKG   ____________________________________________  RADIOLOGY  No results found.  ____________________________________________   PROCEDURES  Procedure(s) performed: None  Procedures  Critical Care performed: No  ____________________________________________   INITIAL IMPRESSION / ASSESSMENT AND PLAN / ED COURSE  Pertinent labs & imaging results that were available during my care of the patient were reviewed by me and considered in my medical decision making (see chart for details).   Patient sent for rule out DVT given twitching, which she describes seems to be potential muscle spasm from her description of feeling a tightness followed by twitching that is now completely relieved.  She has very reassuring clinical examination, and if negative for DVT anticipate discharge to follow-up with her PCP.  Patient comfortable with this plan.  Ongoing care assigned to Dr. Joni Fears, follow-up on DVT study of the left lower extremity.  If negative would anticipate discharge to home.      ____________________________________________   FINAL CLINICAL IMPRESSION(S) / ED DIAGNOSES  Final diagnoses:  Leg cramp        Note:  This document was prepared using Dragon voice recognition software and may include unintentional dictation errors       Delman Kitten, MD 11/21/19 2035

## 2019-11-21 NOTE — ED Triage Notes (Signed)
Pt presents to ED via POV with c/o L leg pain and swelling that started yesterday/this morning. Pt states went to UC, and was sent due to elevated D-dimer.   Pt denies CP/SOB at this time.

## 2019-11-21 NOTE — Telephone Encounter (Signed)
Noted.  Agree with need for evaluation.   

## 2019-11-21 NOTE — ED Provider Notes (Signed)
-----------------------------------------   9:58 PM on 11/21/2019 -----------------------------------------  Blood pressure (!) 154/82, pulse 65, temperature 98 F (36.7 C), temperature source Oral, resp. rate 18, height 5\' 1"  (1.549 m), weight 71.7 kg, SpO2 98 %.  Assuming care from Dr. Jacqualine Code.  In short, Morgan Byrd is a 84 y.o. female with a chief complaint of Leg Pain .  Refer to the original H&P for additional details.  The current plan of care is to await ultrasound results.  Patient presented to emergency department from urgent care for evaluation of muscle twitching/calf pain.  Patient had been evaluated at urgent care, D-dimer was recorded at 752.91.  Age-adjusted D-dimer cut off however comes to 810.  At this time, patient's ultrasound returns negative as well.  Given age-adjusted D-dimer is above recorded findings, no further imaging will be ordered.  Patient has no other complaints.  I discussed likely causes of muscle cramps and spasms in the lower extremity.  This includes electrolyte abnormality, nerve irritation, infection, occlusion.  No evidence of cellulitis.  No evidence of occlusion on imaging.  At this time I have instructed the patient to drink plenty of fluids including electrolyte added fluids such as smart water, Pedialyte, Gatorade or Powerade.  In addition, patient should have banana for extra potassium.  Patient is requesting medication to help her sleep as the twitching and spasming has kept her awake.  I will prescribe a muscle relaxer, but have instructed the patient given her age to be very cautious when using same.  To ensure that she has proper lighting, has had on the edge of her bedding prior to getting up during the middle the night.  Patient has c-collar in place.  This is from previous injury.  Patient follows up with neurosurgery tomorrow to see if c-collar may be removed.  She may keep this appointment.  Diagnosis: Muscle spasm of the calf      Morgan Byrd 11/21/19 2204    Morgan Bruce, MD 11/22/19 1257

## 2019-11-21 NOTE — ED Triage Notes (Signed)
Patient c/o left leg swelling that started yesterday. Denies pain. Patient states the pain is in her left calf.

## 2019-11-21 NOTE — Telephone Encounter (Signed)
Called this morning on left side of posterior calf  has a swollen place like , and it shakes when she walks is not painful. Said last week she woke up with sharpe explosive pain in the foot of same leg , but lasted only a few minutes. There is a Bruise on the top of the since this happen.  Patient per [facility] and had some discomfort to back of leg like a pulling sensation. Advised patient to go to UC due to could not R/O a DVT by phone, directed patient to [place]. Patient stated she will have someone drive her.

## 2019-11-21 NOTE — ED Provider Notes (Signed)
HPI  SUBJECTIVE:  Morgan Byrd is a 84 y.o. female who presents with atraumatic left calf swelling starting yesterday.  She denies any calf pain.  No chest pain, shortness of breath, cough, hemoptysis.  No erythema, color changes, recent immobilization.  No change in physical activity.  She is status post nasal surgery for which she underwent general anesthesia on December 7.  She tried wearing some support hose with some improvement in the swelling.  No aggravating factors.  She called her PMD today, and he was concerned about a DVT.  Past medical history of hypertension.  No history of peripheral arterial disease, peripheral vascular disease, DVT, PE, hypercoagulability, antiplatelet, anticoagulant use, cancer, diabetes.  [id number], Angela Adam, MD  Past Medical History:  Diagnosis Date  . Anxiety   . Hypertension     Past Surgical History:  Procedure Laterality Date  . ABDOMINAL HYSTERECTOMY    . BLADDER SUSPENSION    . BREAST BIOPSY Right 1978   neg  . COLONOSCOPY WITH PROPOFOL N/A 12/26/2015   Procedure: COLONOSCOPY WITH PROPOFOL;  Surgeon: Manya Silvas, MD;  Location: [facility] ENDOSCOPY;  Service: Endoscopy;  Laterality: N/A;  . REPLACEMENT TOTAL KNEE BILATERAL    . SHOULDER SURGERY    . VAGINAL DELIVERY     2    Family History  Problem Relation Age of Onset  . Dementia Mother   . Arthritis Mother   . Heart disease Father   . Hypertension Father   . Cancer Father        prostate  . Diabetes Father   . Diabetes Sister   . Cancer Sister        breast  . Breast cancer Sister 25  . Heart disease Sister   . Vasculitis Son   . Kidney disease Son   . Pneumonia Son   . Breast cancer Cousin 56    Social History   Tobacco Use  . Smoking status: Former Smoker    Packs/day: 1.00    Years: 4.00    Pack years: 4.00    Types: Cigarettes    Quit date: 1974    Years since quitting: 47.0  . Smokeless tobacco: Never Used  Substance Use Topics  . Alcohol use: No  . Drug  use: No    No current facility-administered medications for this encounter.  Current Outpatient Medications:  .  ALLERGY RELIEF 180 MG tablet, TAKE ONE TABLET BY MOUTH EVERY DAY, Disp: 90 tablet, Rfl: 1 .  ALPRAZolam (XANAX) 0.25 MG tablet, Take by mouth., Disp: , Rfl:  .  aspirin 81 MG EC tablet, Take 81 mg by mouth daily. Swallow whole., Disp: , Rfl:  .  B Complex-C (SUPER B COMPLEX PO), Take by mouth., Disp: , Rfl:  .  CALCIUM-MAGNESIUM-ZINC PO, Take by mouth., Disp: , Rfl:  .  cetirizine (ZYRTEC) 10 MG tablet, Take 10 mg by mouth daily., Disp: , Rfl:  .  cholecalciferol (VITAMIN D) 1000 UNITS tablet, Take 1,000 Units by mouth daily., Disp: , Rfl:  .  fluticasone (FLONASE) 50 MCG/ACT nasal spray, TAKE 2 SPRAYS INTO BOTH NOSTRILS DAILY, Disp: 16 g, Rfl: 1 .  hydrochlorothiazide (MICROZIDE) 12.5 MG capsule, TAKE 1 CAPSULE EVERY DAY, Disp: 90 capsule, Rfl: 3 .  irbesartan (AVAPRO) 150 MG tablet, Take 1 tablet (150 mg total) by mouth daily., Disp: 30 tablet, Rfl: 6 .  methimazole (TAPAZOLE) 5 MG tablet, Take 2.5 mg by mouth daily., Disp: , Rfl:  .  metoprolol tartrate (LOPRESSOR)  25 MG tablet, Take 1 tablet (25 mg total) by mouth 2 (two) times daily., Disp: 60 tablet, Rfl: 6 .  Multiple Vitamin (MULTIVITAMIN) capsule, Take 1 capsule by mouth daily., Disp: , Rfl:  .  omeprazole (PRILOSEC) 20 MG capsule, TAKE 1 CAPSULE EVERY DAY, Disp: 90 capsule, Rfl: 1 .  montelukast (SINGULAIR) 10 MG tablet, TAKE ONE TABLET AT BEDTIME, Disp: 90 tablet, Rfl: 1 .  risedronate (ACTONEL) 35 MG tablet, TAKE 1 TABLET EVERY 7 DAYS WITH A FULL GLASS OF WATER ON AN EMPTY STOMACH DO NOT LIE DOWN FOR AT LEAST 30 MIN, Disp: 12 tablet, Rfl: 1  Allergies  Allergen Reactions  . Codeine Nausea And Vomiting  . Fosamax [Alendronate Sodium]   . Lexapro [Escitalopram Oxalate]      ROS  As noted in HPI.   Physical Exam  BP (!) 158/74 (BP Location: Right Arm)   Pulse 62   Temp 97.7 F (36.5 C) (Oral)   Resp 18    Ht 5\' 1"  (1.549 m)   Wt 71.7 kg   SpO2 97%   BMI 29.85 kg/m   Constitutional: Well developed, well nourished, no acute distress Eyes:  EOMI, conjunctiva normal bilaterally HENT: Normocephalic, atraumatic,mucus membranes moist Respiratory: Normal inspiratory effort Cardiovascular: Normal rate GI: nondistended skin: No rash, skin intact Musculoskeletal: Left calf 41.5 cm, right calf 43.5 cm.  Positive tenderness posterior left calf.  No appreciable cord.  No warmth.  No erythema or other discoloration.  No tenderness along the medial thigh.  Knee, ankle nontender.  PT 2+.  Trace edema bilateral lower extremities. Neurologic: Alert & oriented x 3, no focal neuro deficits Psychiatric: Speech and behavior appropriate   ED Course   Medications - No data to display  Orders Placed This Encounter  Procedures  . Fibrin derivatives D-Dimer    Standing Status:   Standing    Number of Occurrences:   1    Results for orders placed or performed during the hospital encounter of 11/21/19 (from the past 24 hour(s))  Fibrin derivatives D-Dimer     Status: Abnormal   Collection Time: 11/21/19  2:11 PM  Result Value Ref Range   Fibrin derivatives D-dimer ([facility]) 752.91 (H) 0.00 - 499.00 ng/mL (FEU)   No results found.  ED Clinical Impression  1. Calf swelling      ED Assessment/Plan  Outside records reviewed.  Additional medical history and date of surgery obtained.  Wells score 2.  Moderate pretest probability of DVT.  Will get D-dimer.  Discharging patient.  will call her at [phone] with results.  She Agrees to go to the ER if the D-dimer is positive to rule out DVT.  In the meantime, compression stockings, warm or cool compresses, whichever feels better, gentle stretching, Tylenol as needed.  D-dimer positive.  Called patient, discussed this with her.  She will go to the ED for ultrasound, and initiation of anticoagulation if necessary.  No orders of the defined types were  placed in this encounter.   *This clinic note was created using Dragon dictation software. Therefore, there may be occasional mistakes despite careful proofreading.   ?   Melynda Ripple, MD 11/21/19 1745

## 2019-11-21 NOTE — ED Notes (Signed)
Pt ambulated to door exhibiting no signs of acute distress requesting a update on her u/s results. This RN explained that they were not back at this time but we are expecting the results soon. Nothing needed from staff at this time

## 2019-11-22 DIAGNOSIS — W010XXD Fall on same level from slipping, tripping and stumbling without subsequent striking against object, subsequent encounter: Secondary | ICD-10-CM | POA: Diagnosis not present

## 2019-11-22 DIAGNOSIS — W1839XD Other fall on same level, subsequent encounter: Secondary | ICD-10-CM | POA: Diagnosis not present

## 2019-11-22 DIAGNOSIS — S12190D Other displaced fracture of second cervical vertebra, subsequent encounter for fracture with routine healing: Secondary | ICD-10-CM | POA: Diagnosis not present

## 2019-11-27 ENCOUNTER — Other Ambulatory Visit: Payer: PPO

## 2019-12-10 ENCOUNTER — Encounter: Payer: Self-pay | Admitting: Cardiology

## 2019-12-10 ENCOUNTER — Ambulatory Visit (INDEPENDENT_AMBULATORY_CARE_PROVIDER_SITE_OTHER): Payer: PPO | Admitting: Cardiology

## 2019-12-10 ENCOUNTER — Other Ambulatory Visit: Payer: Self-pay

## 2019-12-10 VITALS — BP 140/80 | HR 63 | Ht 61.0 in | Wt 162.5 lb

## 2019-12-10 DIAGNOSIS — R0789 Other chest pain: Secondary | ICD-10-CM

## 2019-12-10 DIAGNOSIS — I517 Cardiomegaly: Secondary | ICD-10-CM | POA: Diagnosis not present

## 2019-12-10 DIAGNOSIS — I10 Essential (primary) hypertension: Secondary | ICD-10-CM | POA: Diagnosis not present

## 2019-12-10 NOTE — Progress Notes (Signed)
Cardiology Office Note:    Date:  12/10/2019   ID:  Morgan Byrd, DOB 1936/04/27, MRN [id number]  PCP:  Leone Haven, MD  Cardiologist:  Kate Sable, MD  Electrophysiologist:  None   Referring MD: Leone Haven, MD   Chief Complaint  Patient presents with  . office visit    2 mo F/U after echo; Meds verbally reviewed with patient.    History of Present Illness:    Morgan Byrd is a 84 y.o. female with a hx of hypertension who presents as a follow-up.  She was last seen as a follow-up from from the emergency department due to chest tightness.  Patient was seen in the ED because of a 2-day history of central chest tightness.  Patient states symptoms started when she bent over to help her husband put on his shoe.  The chest tightness was not really painful.  She also notes some shortness of breath over the same period with ambulating.  She checks her blood pressure at home frequently and systolic blood pressure is anywhere from 140s to 200s.  Due to her symptoms and elevated blood pressure she presented to the emergency room.  In the emergency room, EKG showed sinus rhythm with frequent PVCs in a bigeminy pattern, chest x-ray showed cardiomegaly with pulmonary vascular prominence but no edema.  Blood pressure was elevated at 161/48.  High-sensitivity troponins were negative, BNP was elevated 497.  Patient was started on ibesartan 75 mg daily.  She was originally already on metoprolol tartrate 50 mg twice daily, hydrochlorothiazide 12.5 daily.  After last visit, patient symptoms seem to be improving with blood pressure control.  Ibesartan was increased to 150 mg daily.  Echocardiogram was ordered which patient performed at [facility] on 10/11/2019.  Report available in care everywhere, but briefly normal left ventricular systolic function, normal right ventricular systolic function,  EF was 55%, trivial MR, mild TR.   Past Medical History:  Diagnosis Date  . Anxiety    . Hypertension     Past Surgical History:  Procedure Laterality Date  . ABDOMINAL HYSTERECTOMY    . BLADDER SUSPENSION    . BREAST BIOPSY Right 1978   neg  . COLONOSCOPY WITH PROPOFOL N/A 12/26/2015   Procedure: COLONOSCOPY WITH PROPOFOL;  Surgeon: Manya Silvas, MD;  Location: [facility] ENDOSCOPY;  Service: Endoscopy;  Laterality: N/A;  . NOSE SURGERY    . REPLACEMENT TOTAL KNEE BILATERAL    . SHOULDER SURGERY    . VAGINAL DELIVERY     2    Current Medications: Current Meds  Medication Sig  . ALLERGY RELIEF 180 MG tablet TAKE ONE TABLET BY MOUTH EVERY DAY  . aspirin 81 MG EC tablet Take 81 mg by mouth daily. Swallow whole.  . B Complex-C (SUPER B COMPLEX PO) Take by mouth daily.   Marland Kitchen CALCIUM-MAGNESIUM-ZINC PO Take by mouth daily.   . cholecalciferol (VITAMIN D) 1000 UNITS tablet Take 1,000 Units by mouth daily.  . fluticasone (FLONASE) 50 MCG/ACT nasal spray TAKE 2 SPRAYS INTO BOTH NOSTRILS DAILY  . hydrochlorothiazide (MICROZIDE) 12.5 MG capsule TAKE 1 CAPSULE EVERY DAY  . irbesartan (AVAPRO) 150 MG tablet Take 1 tablet (150 mg total) by mouth daily.  . methimazole (TAPAZOLE) 5 MG tablet Take 2.5 mg by mouth daily.  . methocarbamol (ROBAXIN) 500 MG tablet Take 1 tablet (500 mg total) by mouth at bedtime as needed for muscle spasms.  . metoprolol tartrate (LOPRESSOR) 25 MG tablet Take 1  tablet (25 mg total) by mouth 2 (two) times daily.  . montelukast (SINGULAIR) 10 MG tablet TAKE ONE TABLET AT BEDTIME  . Multiple Vitamin (MULTIVITAMIN) capsule Take 1 capsule by mouth daily.  . risedronate (ACTONEL) 35 MG tablet TAKE 1 TABLET EVERY 7 DAYS WITH A FULL GLASS OF WATER ON AN EMPTY STOMACH DO NOT LIE DOWN FOR AT LEAST 30 MIN     Allergies:   Codeine, Fosamax [alendronate sodium], and Lexapro [escitalopram oxalate]   Social History   Socioeconomic History  . Marital status: Married    Spouse name: Not on file  . Number of children: Not on file  . Years of education: Not on file   . Highest education level: Not on file  Occupational History  . Not on file  Tobacco Use  . Smoking status: Former Smoker    Packs/day: 1.00    Years: 4.00    Pack years: 4.00    Types: Cigarettes    Quit date: 1974    Years since quitting: 47.1  . Smokeless tobacco: Never Used  Substance and Sexual Activity  . Alcohol use: No  . Drug use: No  . Sexual activity: Never  Other Topics Concern  . Not on file  Social History Narrative   Lives in [place] with husband. Son and daughter live nearby.      Work - retired Network engineer      Diet - healthy, regular   Exercise - housework   Social Determinants of Radio broadcast assistant Strain: Low Risk   . Difficulty of Paying Living Expenses: Not hard at all  Food Insecurity: No Food Insecurity  . Worried About Charity fundraiser in the Last Year: Never true  . Ran Out of Food in the Last Year: Never true  Transportation Needs:   . Lack of Transportation (Medical): Not on file  . Lack of Transportation (Non-Medical): Not on file  Physical Activity: Insufficiently Active  . Days of Exercise per Week: 2 days  . Minutes of Exercise per Session: 60 min  Stress:   . Feeling of Stress : Not on file  Social Connections:   . Frequency of Communication with Friends and Family: Not on file  . Frequency of Social Gatherings with Friends and Family: Not on file  . Attends Religious Services: Not on file  . Active Member of Clubs or Organizations: Not on file  . Attends Archivist Meetings: Not on file  . Marital Status: Not on file     Family History: The patient's family history includes Arthritis in her mother; Breast cancer (age of onset: 74) in her cousin; Breast cancer (age of onset: 73) in her sister; Cancer in her father and sister; Dementia in her mother; Diabetes in her father and sister; Heart disease in her father and sister; Hypertension in her father; Kidney disease in her son; Pneumonia in her son; Vasculitis in  her son.  ROS:   Please see the history of present illness.     All other systems reviewed and are negative.  EKGs/Labs/Other Studies Reviewed:    The following studies were reviewed today: TTE 11/13/2019 [employer]  INTERPRETATION ---------------------------------------------------------------   NORMAL LEFT VENTRICULAR SYSTOLIC FUNCTION   NORMAL RIGHT VENTRICULAR SYSTOLIC FUNCTION   VALVULAR REGURGITATION: TRIVIAL MR, MILD TR   NO VALVULAR STENOSIS   APICAL ROCKING AND CLASSICAL DYSSYNCHRONY PATTERN.   PVCS THROUGHOUT EXAM.  EKG:  EKG is  ordered today.  The ekg ordered today  demonstrates sinus rhythm, heart rate 63, left bundle branch block.  Recent Labs: 04/23/2019: TSH 0.08 07/25/2019: ALT 21 09/08/2019: B Natriuretic Peptide 497.0 10/04/2019: BUN 23; Creatinine, Ser 0.96; Hemoglobin 13.4; Platelets 258; Potassium 4.0; Sodium 139  Recent Lipid Panel    Component Value Date/Time   CHOL 190 07/25/2019 0839   TRIG 93.0 07/25/2019 0839   HDL 65.40 07/25/2019 0839   CHOLHDL 3 07/25/2019 0839   VLDL 18.6 07/25/2019 0839   LDLCALC 106 (H) 07/25/2019 0839   LDLDIRECT 131.8 04/30/2013 0915    Physical Exam:    VS:  BP 140/80 (BP Location: Left Arm, Patient Position: Sitting, Cuff Size: Normal)   Pulse 63   Ht 5\' 1"  (1.549 m)   Wt 162 lb 8 oz (73.7 kg)   SpO2 92%   BMI 30.70 kg/m     Wt Readings from Last 3 Encounters:  12/10/19 162 lb 8 oz (73.7 kg)  11/21/19 158 lb (71.7 kg)  11/21/19 158 lb (71.7 kg)     GEN:  Well nourished, well developed in no acute distress HEENT: Normal NECK: No JVD; No carotid bruits LYMPHATICS: No lymphadenopathy CARDIAC: RRR, [id number] systolic murmur at left sternal border, rubs, gallops RESPIRATORY:  Clear to auscultation without rales, wheezing or rhonchi  ABDOMEN: Soft, non-tender, non-distended MUSCULOSKELETAL:  No edema; No deformity  SKIN: Warm and dry NEUROLOGIC:  Alert and oriented x 3 PSYCHIATRIC:  Normal affect    ASSESSMENT:    1. Essential hypertension   2. Atypical chest pain   3. Cardiomegaly    PLAN:    In order of problems listed above:  1. Blood pressure is much improved, continue irbesartan to 150 mg daily, HCTZ 12.5 mg daily, Lopressor to 25 mg twice daily. 2. Very atypical chest discomfort.  Symptoms have improved with better blood pressure control.  Echocardiogram normal 3. Cardiomegaly was noted on chest x-ray, echocardiogram report shows normal LV size and normal LV and RV function.  Patient reassured  Total encounter time more than 35 minutes  Greater than 50% was spent in counseling and coordination of care with the patient.   Follow-up in about 1 year  This note was generated in part or whole with voice recognition software. Voice recognition is usually quite accurate but there are transcription errors that can and very often do occur. I apologize for any typographical errors that were not detected and corrected.  Medication Adjustments/Labs and Tests Ordered: Current medicines are reviewed at length with the patient today.  Concerns regarding medicines are outlined above.  Orders Placed This Encounter  Procedures  . EKG 12-Lead   No orders of the defined types were placed in this encounter.   Patient Instructions  Medication Instructions:  - Your physician recommends that you continue on your current medications as directed. Please refer to the Current Medication list given to you today.  *If you need a refill on your cardiac medications before your next appointment, please call your pharmacy*  Lab Work: - none ordered  If you have labs (blood work) drawn today and your tests are completely normal, you will receive your results only by: Marland Kitchen MyChart Message (if you have MyChart) OR . A paper copy in the mail If you have any lab test that is abnormal or we need to change your treatment, we will call you to review the results.  Testing/Procedures: - none ordered    Follow-Up: At [facility], you and your health needs are our priority.  As part  of our continuing mission to provide you with exceptional heart care, we have created designated Provider Care Teams.  These Care Teams include your primary Cardiologist (physician) and Advanced Practice Providers (APPs -  Physician Assistants and Nurse Practitioners) who all work together to provide you with the care you need, when you need it.  Your next appointment:   1 year(s)  The format for your next appointment:   In Person  Provider:   Kate Sable, MD  Other Instructions n/a     Signed, Kate Sable, MD  12/10/2019 12:20 PM    [place]

## 2019-12-10 NOTE — Patient Instructions (Signed)
Medication Instructions:  - Your physician recommends that you continue on your current medications as directed. Please refer to the Current Medication list given to you today.  *If you need a refill on your cardiac medications before your next appointment, please call your pharmacy*  Lab Work: - none ordered  If you have labs (blood work) drawn today and your tests are completely normal, you will receive your results only by: Marland Kitchen MyChart Message (if you have MyChart) OR . A paper copy in the mail If you have any lab test that is abnormal or we need to change your treatment, we will call you to review the results.  Testing/Procedures: - none ordered   Follow-Up: At [facility], you and your health needs are our priority.  As part of our continuing mission to provide you with exceptional heart care, we have created designated Provider Care Teams.  These Care Teams include your primary Cardiologist (physician) and Advanced Practice Providers (APPs -  Physician Assistants and Nurse Practitioners) who all work together to provide you with the care you need, when you need it.  Your next appointment:   1 year(s)  The format for your next appointment:   In Person  Provider:   Kate Sable, MD  Other Instructions n/a

## 2019-12-12 ENCOUNTER — Ambulatory Visit: Payer: PPO | Admitting: Family Medicine

## 2020-01-03 DIAGNOSIS — S12100D Unspecified displaced fracture of second cervical vertebra, subsequent encounter for fracture with routine healing: Secondary | ICD-10-CM | POA: Diagnosis not present

## 2020-01-03 DIAGNOSIS — M542 Cervicalgia: Secondary | ICD-10-CM | POA: Diagnosis not present

## 2020-01-03 DIAGNOSIS — W010XXD Fall on same level from slipping, tripping and stumbling without subsequent striking against object, subsequent encounter: Secondary | ICD-10-CM | POA: Diagnosis not present

## 2020-01-03 DIAGNOSIS — M8588 Other specified disorders of bone density and structure, other site: Secondary | ICD-10-CM | POA: Diagnosis not present

## 2020-01-03 DIAGNOSIS — S12190D Other displaced fracture of second cervical vertebra, subsequent encounter for fracture with routine healing: Secondary | ICD-10-CM | POA: Diagnosis not present

## 2020-01-21 ENCOUNTER — Other Ambulatory Visit: Payer: Self-pay | Admitting: Family Medicine

## 2020-01-21 DIAGNOSIS — J309 Allergic rhinitis, unspecified: Secondary | ICD-10-CM

## 2020-02-12 ENCOUNTER — Other Ambulatory Visit: Payer: Self-pay | Admitting: Family Medicine

## 2020-02-12 DIAGNOSIS — J309 Allergic rhinitis, unspecified: Secondary | ICD-10-CM

## 2020-03-12 DIAGNOSIS — E0590 Thyrotoxicosis, unspecified without thyrotoxic crisis or storm: Secondary | ICD-10-CM | POA: Diagnosis not present

## 2020-03-12 DIAGNOSIS — E042 Nontoxic multinodular goiter: Secondary | ICD-10-CM | POA: Diagnosis not present

## 2020-03-17 DIAGNOSIS — D2272 Melanocytic nevi of left lower limb, including hip: Secondary | ICD-10-CM | POA: Diagnosis not present

## 2020-03-17 DIAGNOSIS — D225 Melanocytic nevi of trunk: Secondary | ICD-10-CM | POA: Diagnosis not present

## 2020-03-17 DIAGNOSIS — L821 Other seborrheic keratosis: Secondary | ICD-10-CM | POA: Diagnosis not present

## 2020-03-17 DIAGNOSIS — L570 Actinic keratosis: Secondary | ICD-10-CM | POA: Diagnosis not present

## 2020-03-17 DIAGNOSIS — D2271 Melanocytic nevi of right lower limb, including hip: Secondary | ICD-10-CM | POA: Diagnosis not present

## 2020-03-17 DIAGNOSIS — D2261 Melanocytic nevi of right upper limb, including shoulder: Secondary | ICD-10-CM | POA: Diagnosis not present

## 2020-03-17 DIAGNOSIS — D2262 Melanocytic nevi of left upper limb, including shoulder: Secondary | ICD-10-CM | POA: Diagnosis not present

## 2020-03-19 DIAGNOSIS — E0590 Thyrotoxicosis, unspecified without thyrotoxic crisis or storm: Secondary | ICD-10-CM | POA: Diagnosis not present

## 2020-03-19 DIAGNOSIS — E042 Nontoxic multinodular goiter: Secondary | ICD-10-CM | POA: Diagnosis not present

## 2020-04-07 ENCOUNTER — Other Ambulatory Visit: Payer: Self-pay | Admitting: Family Medicine

## 2020-04-08 ENCOUNTER — Ambulatory Visit (INDEPENDENT_AMBULATORY_CARE_PROVIDER_SITE_OTHER): Payer: PPO

## 2020-04-08 VITALS — Ht 61.0 in | Wt 162.0 lb

## 2020-04-08 DIAGNOSIS — Z0000 Encounter for general adult medical examination without abnormal findings: Secondary | ICD-10-CM | POA: Diagnosis not present

## 2020-04-08 NOTE — Patient Instructions (Addendum)
  Morgan Byrd , Thank you for taking time to come for your Medicare Wellness Visit. I appreciate your ongoing commitment to your health goals. Please review the following plan we discussed and let me know if I can assist you in the future.   These are the goals we discussed: Goals      Patient Stated   . Follow up with Provider as needed (pt-stated)       This is a list of the screening recommended for you and due dates:  Health Maintenance  Topic Date Due  . Flu Shot  06/08/2020  . Tetanus Vaccine  10/03/2029  . DEXA scan (bone density measurement)  Completed  . COVID-19 Vaccine  Completed  . Pneumonia vaccines  Completed

## 2020-04-08 NOTE — Progress Notes (Signed)
I have reviewed the above note and agree.  Selwyn Reason, M.D.  

## 2020-04-08 NOTE — Progress Notes (Signed)
Subjective:   Morgan Byrd is a 84 y.o. female who presents for Medicare Annual (Subsequent) preventive examination.  Review of Systems:  No ROS.  Medicare Wellness Virtual Visit.   Cardiac Risk Factors include: advanced age (>68men, >71 women);hypertension     Objective:     Vitals: Ht 5\' 1"  (1.549 m)   Wt 162 lb (73.5 kg)   BMI 30.61 kg/m   Body mass index is 30.61 kg/m.  Advanced Directives 04/08/2020 11/21/2019 10/04/2019 09/08/2019 04/06/2019 02/14/2018 02/10/2017  Does Patient Have a Medical Advance Directive? Yes Yes No No Yes Yes Yes  Type of Paramedic of [place];Living will [place];Living will - - [place];Living will [place];Living will [place];Living will  Does patient want to make changes to medical advance directive? No - Patient declined No - Patient declined - - No - Patient declined No - Patient declined No - Patient declined  Copy of [place] in Chart? No - copy requested No - copy requested - - No - copy requested No - copy requested No - copy requested    Tobacco Social History   Tobacco Use  Smoking Status Former Smoker  . Packs/day: 1.00  . Years: 4.00  . Pack years: 4.00  . Types: Cigarettes  . Quit date: 63  . Years since quitting: 47.4  Smokeless Tobacco Never Used     Counseling given: Not Answered   Clinical Intake:  Pre-visit preparation completed: Yes           How often do you need to have someone help you when you read instructions, pamphlets, or other written materials from your doctor or pharmacy?: 1 - Never  Interpreter Needed?: No     Past Medical History:  Diagnosis Date  . Anxiety   . Hypertension    Past Surgical History:  Procedure Laterality Date  . ABDOMINAL HYSTERECTOMY    . BLADDER SUSPENSION    . BREAST BIOPSY Right 1978   neg  . COLONOSCOPY WITH PROPOFOL N/A 12/26/2015   Procedure: COLONOSCOPY WITH PROPOFOL;  Surgeon: Manya Silvas, MD;  Location: [facility] ENDOSCOPY;  Service: Endoscopy;  Laterality: N/A;  . NOSE SURGERY    . REPLACEMENT TOTAL KNEE BILATERAL    . SHOULDER SURGERY    . VAGINAL DELIVERY     2   Family History  Problem Relation Age of Onset  . Dementia Mother   . Arthritis Mother   . Heart disease Father   . Hypertension Father   . Cancer Father        prostate  . Diabetes Father   . Diabetes Sister   . Cancer Sister        breast  . Breast cancer Sister 84  . Heart disease Sister   . Vasculitis Son   . Kidney disease Son   . Pneumonia Son   . Breast cancer Cousin 49   Social History   Socioeconomic History  . Marital status: Married    Spouse name: Not on file  . Number of children: Not on file  . Years of education: Not on file  . Highest education level: Not on file  Occupational History  . Not on file  Tobacco Use  . Smoking status: Former Smoker    Packs/day: 1.00    Years: 4.00    Pack years: 4.00    Types: Cigarettes    Quit date: 1974  Years since quitting: 47.4  . Smokeless tobacco: Never Used  Substance and Sexual Activity  . Alcohol use: No  . Drug use: No  . Sexual activity: Never  Other Topics Concern  . Not on file  Social History Narrative   Lives in [place] with husband. Son and daughter live nearby.      Work - retired Network engineer      Diet - healthy, regular   Exercise - housework   Social Determinants of Radio broadcast assistant Strain:   . Difficulty of Paying Living Expenses:   Food Insecurity: No Landscape architect  . Worried About Charity fundraiser in the Last Year: Never true  . Ran Out of Food in the Last Year: Never true  Transportation Needs:   . Lack of Transportation (Medical):   Marland Kitchen Lack of Transportation (Non-Medical):   Physical Activity:   . Days of Exercise per Week:   . Minutes of Exercise per Session:   Stress:   . Feeling of Stress :   Social Connections:   .  Frequency of Communication with Friends and Family:   . Frequency of Social Gatherings with Friends and Family:   . Attends Religious Services:   . Active Member of Clubs or Organizations:   . Attends Archivist Meetings:   Marland Kitchen Marital Status:     Outpatient Encounter Medications as of 04/08/2020  Medication Sig  . ALLERGY RELIEF 180 MG tablet TAKE ONE TABLET EVERY DAY  . aspirin 81 MG EC tablet Take 81 mg by mouth daily. Swallow whole.  . B Complex-C (SUPER B COMPLEX PO) Take by mouth daily.   Marland Kitchen CALCIUM-MAGNESIUM-ZINC PO Take by mouth daily.   . cetirizine (ZYRTEC) 10 MG tablet Take 10 mg by mouth daily.  . cholecalciferol (VITAMIN D) 1000 UNITS tablet Take 1,000 Units by mouth daily.  . fluticasone (FLONASE) 50 MCG/ACT nasal spray TAKE 2 SPRAYS INTO BOTH NOSTRILS DAILY  . hydrochlorothiazide (MICROZIDE) 12.5 MG capsule TAKE 1 CAPSULE EVERY DAY  . irbesartan (AVAPRO) 150 MG tablet Take 1 tablet (150 mg total) by mouth daily.  . methimazole (TAPAZOLE) 5 MG tablet Take 2.5 mg by mouth daily.  . methocarbamol (ROBAXIN) 500 MG tablet Take 1 tablet (500 mg total) by mouth at bedtime as needed for muscle spasms.  . metoprolol tartrate (LOPRESSOR) 25 MG tablet Take 1 tablet (25 mg total) by mouth 2 (two) times daily.  . montelukast (SINGULAIR) 10 MG tablet TAKE ONE TABLET BY MOUTH AT BEDTIME  . Multiple Vitamin (MULTIVITAMIN) capsule Take 1 capsule by mouth daily.  Marland Kitchen omeprazole (PRILOSEC) 20 MG capsule TAKE 1 CAPSULE EVERY DAY (Patient not taking: Reported on 12/10/2019)  . risedronate (ACTONEL) 35 MG tablet TAKE 1 TABLET EVERY 7 DAYS WITH A FULL GLASS OF WATER ON AN EMPTY STOMACH DO NOT LIE DOWN FOR AT LEAST 30 MIN   No facility-administered encounter medications on file as of 04/08/2020.    Activities of Daily Living In your present state of health, do you have any difficulty performing the following activities: 04/08/2020  Hearing? N  Vision? N  Difficulty concentrating or making  decisions? N  Walking or climbing stairs? N  Dressing or bathing? N  Doing errands, shopping? N  Preparing Food and eating ? N  Using the Toilet? N  In the past six months, have you accidently leaked urine? N  Do you have problems with loss of bowel control? N  Managing your Medications? N  Managing  your Finances? N  Housekeeping or managing your Housekeeping? N  Some recent data might be hidden    Patient Care Team: Leone Haven, MD as PCP - General (Family Medicine) Kate Sable, MD as PCP - Cardiology (Cardiology)    Assessment:   This is a routine wellness examination for [place].  I connected with Ausha today by telephone and verified that I am speaking with the correct person using two identifiers. Location patient: home Location provider: work Persons participating in the virtual visit: patient, Marine scientist.    I discussed the limitations, risks, security and privacy concerns of performing an evaluation and management service by telephone and the availability of in person appointments.    Interactive audio and video telecommunications were attempted between this provider and patient, however failed, due to patient having technical difficulties OR patient did not have access to video capability.  We continued and completed visit with audio only.  Some vital signs may be absent or patient reported.   Health Maintenance Due: See completed HM at the end of note.   Eye: Visual acuity not assessed. Virtual visit. Followed by their ophthalmologist.  Dental: Visits every 6 months.    Hearing: Demonstrates normal hearing during visit.  Safety:  Patient feels safe at home- yes Patient does have smoke detectors at home- yes Patient does wear sunscreen or protective clothing when in direct sunlight - yes Patient does wear seat belt when in a moving vehicle - yes Patient drives- yes Adequate lighting in walkways free from debris- yes Grab bars and handrails used as  appropriate- yes Ambulates with an assistive device- no Cell phone on person when ambulating outside of the home-yes  Medication: Taking as directed and without issues.  Pill box in use -yes Self managed - yes   Covid-19: Vaccine complete- yes  Activities of Daily Living Patient denies needing assistance with: household chores, feeding themselves, getting from bed to chair, getting to the toilet, bathing/showering, dressing, managing money, or preparing meals.   Discussed the importance of a healthy diet, water intake and the benefits of aerobic exercise.    Physical activity- Walking the dog 2x daily, about 20 minutes  Diet:  Regular Water: good intake  Caffeine: 1 cup of coffee  Other Providers Patient Care Team: Leone Haven, MD as PCP - General (Family Medicine) Kate Sable, MD as PCP - Cardiology (Cardiology) Exercise Activities and Dietary recommendations Current Exercise Habits: Home exercise routine, Type of exercise: walking, Time (Minutes): 20, Frequency (Times/Week): 2, Weekly Exercise (Minutes/Week): 40, Intensity: Mild  Goals      Patient Stated   . Follow up with Provider as needed (pt-stated)       Fall Risk Fall Risk  04/08/2020 11/14/2019 07/25/2019 04/06/2019 11/13/2018  Falls in the past year? 0 1 0 0 0  Number falls in past yr: 0 0 0 - 0  Injury with Fall? - 1 - - 0  Follow up Falls evaluation completed Falls evaluation completed Falls evaluation completed - -   Is the patient's home free of loose throw rugs in walkways, pet beds, electrical cords, etc? Yes  Handrails on the stairs?  Yes   Adequate lighting? Yes  Timed Get Up and Go performed: No, virtual visit  Depression Screen PHQ 2/9 Scores 04/08/2020 11/14/2019 07/25/2019 04/06/2019  PHQ - 2 Score 0 0 0 0     Cognitive Function MMSE - Mini Mental State Exam 02/14/2018 02/10/2017  Orientation to time 5 5  Orientation to Place  5 5  Registration 3 3  Attention/ Calculation 5 5  Recall 2 3    Language- name 2 objects 2 2  Language- repeat 1 1  Language- follow 3 step command 3 3  Language- read & follow direction 1 1  Write a sentence 1 1  Copy design 1 1  Total score 29 30     6CIT Screen 04/08/2020 04/06/2019  What Year? 0 points 0 points  What month? 0 points 0 points  What time? 0 points 0 points  Count back from 20 0 points 0 points  Months in reverse 0 points 0 points  Repeat phrase 0 points 0 points  Total Score 0 0  Patient is alert and oriented x3. Patient denies difficulty focusing or concentrating. Patient reads and manages the finances for brain health.    Immunization History  Administered Date(s) Administered  . Fluad Quad(high Dose 65+) 07/25/2019  . Influenza, High Dose Seasonal PF 08/05/2016, 08/08/2017, 08/14/2018  . Influenza,inj,Quad PF,6+ Mos 08/24/2014, 08/05/2015  . Influenza-Unspecified 08/08/2012, 08/11/2013  . PFIZER SARS-COV-2 Vaccination 11/14/2019, 12/05/2019  . Pneumococcal Conjugate-13 05/03/2014  . Pneumococcal Polysaccharide-23 04/25/2005  . Tdap 04/25/2012, 10/04/2019  . Zoster 06/26/2011  . Zoster Recombinat (Shingrix) 08/08/2017   Screening Tests Health Maintenance  Topic Date Due  . INFLUENZA VACCINE  06/08/2020  . TETANUS/TDAP  10/03/2029  . DEXA SCAN  Completed  . COVID-19 Vaccine  Completed  . PNA vac Low Risk Adult  Completed    Cancer Screenings: Lung: Low Dose CT Chest recommended if Age 69-80 years, 30 pack-year currently smoking OR have quit w/in 15years. Patient does not qualify.     Plan:   Keep all routine maintenance appointments.   Follow up 05/13/20@ 10:00  Medicare Attestation I have personally reviewed: The patient's medical and social history Their use of alcohol, tobacco or illicit drugs Their current medications and supplements The patient's functional ability including ADLs,fall risks, home safety risks, cognitive, and hearing and visual impairment Diet and physical activities Evidence for  depression   I have reviewed and discussed with patient certain preventive protocols, quality metrics, and best practice recommendations.      Varney Biles, LPN  [id number]

## 2020-04-18 ENCOUNTER — Encounter: Payer: Self-pay | Admitting: Internal Medicine

## 2020-04-18 ENCOUNTER — Ambulatory Visit: Payer: PPO | Admitting: Internal Medicine

## 2020-04-18 ENCOUNTER — Other Ambulatory Visit: Payer: Self-pay

## 2020-04-18 ENCOUNTER — Other Ambulatory Visit
Admission: RE | Admit: 2020-04-18 | Discharge: 2020-04-18 | Disposition: A | Discharge status: Home / Self Care | Payer: PPO | Attending: Internal Medicine | Admitting: Internal Medicine

## 2020-04-18 VITALS — BP 144/82 | HR 61 | Temp 98.2°F | Ht 61.0 in | Wt 163.0 lb

## 2020-04-18 DIAGNOSIS — J479 Bronchiectasis, uncomplicated: Secondary | ICD-10-CM

## 2020-04-18 MED ORDER — SPIRIVA RESPIMAT 2.5 MCG/ACT IN AERS: 2.0000 | INHALATION_SPRAY | Freq: Every day | RESPIRATORY_TRACT | 0 refills | Status: DC

## 2020-04-18 NOTE — Progress Notes (Signed)
OV 04/18/2020  Subjective:  Patient ID: Morgan Byrd, female , DOB: 09/03/1936 , age 84 y.o. , MRN: [id number] , ADDRESS: [place] [address]   04/18/2020 -   Chief Complaint  Patient presents with   Follow-up    former Dr. Ashby Dawes- pt reports of prod cough with green mucus.      HPI Morgan Byrd 84 y.o. -follow-up bronchiectasis  84 year old lady very limited previous smoking history.  Last seen by Dr. Ashby Dawes in June 2020.  According to her that the only time she has seen him.  She has mild bronchiectasis in the lingula and the right middle lobe.  She is here for follow-up.  She tells me that in the last 1 year she is not been on prednisone.  She not been on any infectious exacerbation treatment.  No respiratory issues other than baseline mild cough with 2 or 3 times a day of mild green mucus production.  This is hardly bothersome for her.  There is no dyspnea.  She is on Singulair Flonase and antihistamine but this is for sinus issues.  She does not recollect ever having had a pulmonary function test.  She is up-to-date with her Covid vaccine.  Following information from review of the chart  CT chest from May 2020 Lungs/Pleura: Mild scarring, volume loss and bronchiectasis in the right middle lobe, lingula and both lower lobes. 3 mm apical segment right upper lobe nodule (series 3, image 33), nonspecific. No pleural fluid. Airway is unremarkable.  Upper Abdomen: Visualized portions of the liver, adrenal glands, kidneys, spleen, pancreas, stomach and bowel are grossly unremarkable. Cholecystectomy.  Musculoskeletal: No worrisome lytic or sclerotic lesions. Degenerative changes in the spine and shoulders.  IMPRESSION: 1. No acute findings in the chest.  No pulmonary nodule or mass. 2. 3 mm apical segment right upper lobe nodule. No follow-up needed if patient is low-risk. Non-contrast chest CT can be considered in 12 months if  patient is high-risk. This recommendation follows the consensus statement: Guidelines for Management of Incidental Pulmonary Nodules Detected on CT Images: From the Fleischner Society 2017; Radiology 2017; 284:228-243. 3. Left thyroid nodule. Consider further evaluation with thyroid ultrasound. If patient is clinically hyperthyroid, consider nuclear medicine thyroid uptake and scan. 4.  Aortic atherosclerosis (ICD10-170.0).   Electronically Signed   By: Lorin Picket M.D.   On: 03/29/2019 11:27  ROS - per HPI  Bronchiectasis related etiologic work-up -June 2020 QuantiFERON gold negative -Rheumatoid factor - June 2020   has a past medical history of Anxiety and Hypertension.   reports that she quit smoking about 47 years ago. Her smoking use included cigarettes. She has a 4.00 pack-year smoking history. She has never used smokeless tobacco.  Past Surgical History:  Procedure Laterality Date   ABDOMINAL HYSTERECTOMY     BLADDER SUSPENSION     BREAST BIOPSY Right 1978   neg   COLONOSCOPY WITH PROPOFOL N/A 12/26/2015   Procedure: COLONOSCOPY WITH PROPOFOL;  Surgeon: Manya Silvas, MD;  Location: [facility] ENDOSCOPY;  Service: Endoscopy;  Laterality: N/A;   NOSE SURGERY     REPLACEMENT TOTAL KNEE BILATERAL     SHOULDER SURGERY     VAGINAL DELIVERY     2    Allergies  Allergen Reactions   Codeine Nausea And Vomiting   Fosamax [Alendronate Sodium]    Lexapro [Escitalopram Oxalate]     Immunization History  Administered Date(s) Administered   Fluad Quad(high Dose 65+)  07/25/2019   Influenza, High Dose Seasonal PF 08/05/2016, 08/08/2017, 08/14/2018   Influenza,inj,Quad PF,6+ Mos 08/24/2014, 08/05/2015   Influenza-Unspecified 08/08/2012, 08/11/2013   PFIZER SARS-COV-2 Vaccination 11/14/2019, 12/05/2019   Pneumococcal Conjugate-13 05/03/2014   Pneumococcal Polysaccharide-23 04/25/2005   Tdap 04/25/2012, 10/04/2019   Zoster 06/26/2011   Zoster  Recombinat (Shingrix) 08/08/2017    Family History  Problem Relation Age of Onset   Dementia Mother    Arthritis Mother    Heart disease Father    Hypertension Father    Cancer Father        prostate   Diabetes Father    Diabetes Sister    Cancer Sister        breast   Breast cancer Sister 55   Heart disease Sister    Vasculitis Son    Kidney disease Son    Pneumonia Son    Breast cancer Cousin 11     Current Outpatient Medications:    ALLERGY RELIEF 180 MG tablet, TAKE ONE TABLET EVERY DAY, Disp: 90 tablet, Rfl: 1   aspirin 81 MG EC tablet, Take 81 mg by mouth daily. Swallow whole., Disp: , Rfl:    B Complex-C (SUPER B COMPLEX PO), Take by mouth daily. , Disp: , Rfl:    CALCIUM-MAGNESIUM-ZINC PO, Take by mouth daily. , Disp: , Rfl:    cholecalciferol (VITAMIN D) 1000 UNITS tablet, Take 1,000 Units by mouth daily., Disp: , Rfl:    fluticasone (FLONASE) 50 MCG/ACT nasal spray, TAKE 2 SPRAYS INTO BOTH NOSTRILS DAILY, Disp: 16 g, Rfl: 1   hydrochlorothiazide (MICROZIDE) 12.5 MG capsule, TAKE 1 CAPSULE EVERY DAY, Disp: 90 capsule, Rfl: 3   irbesartan (AVAPRO) 150 MG tablet, Take 1 tablet (150 mg total) by mouth daily., Disp: 30 tablet, Rfl: 6   methimazole (TAPAZOLE) 5 MG tablet, Take 2.5 mg by mouth daily., Disp: , Rfl:    metoprolol tartrate (LOPRESSOR) 25 MG tablet, Take 1 tablet (25 mg total) by mouth 2 (two) times daily., Disp: 60 tablet, Rfl: 6   montelukast (SINGULAIR) 10 MG tablet, TAKE ONE TABLET BY MOUTH AT BEDTIME, Disp: 90 tablet, Rfl: 1   Multiple Vitamin (MULTIVITAMIN) capsule, Take 1 capsule by mouth daily., Disp: , Rfl:    risedronate (ACTONEL) 35 MG tablet, TAKE 1 TABLET EVERY 7 DAYS WITH A FULL GLASS OF WATER ON AN EMPTY STOMACH DO NOT LIE DOWN FOR AT LEAST 30 MIN, Disp: 12 tablet, Rfl: 1   Tiotropium Bromide Monohydrate (SPIRIVA RESPIMAT) 2.5 MCG/ACT AERS, Inhale 2 puffs into the lungs daily., Disp: 4 g, Rfl: 0      Objective:    Vitals:   04/18/20 1638  BP: (!) 144/82  Pulse: 61  Temp: 98.2 F (36.8 C)  TempSrc: Temporal  SpO2: 96%  Weight: 163 lb (73.9 kg)  Height: 5\' 1"  (1.549 m)    Estimated body mass index is 30.8 kg/m as calculated from the following:   Height as of this encounter: 5\' 1"  (1.549 m).   Weight as of this encounter: 163 lb (73.9 kg).  @WEIGHTCHANGE @  [employer]   04/18/20 1638  Weight: 163 lb (73.9 kg)     Physical Exam Pleasant female.  Normal oral cavity.  No thrush.  Alert and oriented x3.  Speech normal.  Clear to auscultation bilaterally abdomen soft no sinus no clubbing no edema no rash no stigmata of connective tissue disease         Assessment:       ICD-10-CM  1. Bronchiectasis without complication (HCC)  C58.5 ANA w/Reflex    Cyclic citrul peptide antibody, IgG    Sjogren's syndrome antibods(ssa + ssb)    Alpha-1 antitrypsin phenotype    IgG, IgA, IgM    Gram stain     MYCOBACTERIA, CULTURE, WITH FLUOROCHROME SMEAR    Pulmonary Function Test [facility] Only    CANCELED: IgG       Plan:     Patient Instructions     ICD-10-CM   1. Bronchiectasis without complication ([place])  I77.8     You have a condition called bronchiectasis.  The severity is mild. Sputum clearance is an important goal in your treatment.  Plan -You can continue Singulair Flonase and Claritin for your sinus allergies -For your lungs you can try Mucinex as needed to keep your sputum loose -Also try a sample Spiriva Respimat 2 puffs once daily - -If this is helping you you can call us and we can send a prescription -You need some blood work for looking at reasons for bronchiectasis  -Do blood ANA, CCP, SSA, SSB, alpha-1 phenotype   -Do blood immunoglobulin IgA, IgE, IgG and IgM  -Given mild nature of symptoms you do not need bronchoscopy at this stage -Give sputum for Gram stain and culture and AFB smear and culture when possible  Follow-up -Call us in a few weeks to tell us if  the Spiriva is helping you -Do full pulmonary function test in 6 months -Return to see me in 6 months but after breathing test    (Level 04: Estb 30-39 min  visit type: on-site physical face to visit visit spent in total care time and counseling or/and coordination of care by this undersigned MD - Dr Brand Males. This includes one or more of the following on this same day 04/18/2020: pre-charting, chart review, note writing, documentation discussion of test results, diagnostic or treatment recommendations, prognosis, risks and benefits of management options, instructions, education, compliance or risk-factor reduction. It excludes time spent by the [place] or office staff in the care of the patient . Actual time is 30 min)    SIGNATURE    Dr. Brand Males, M.D., F.C.C.P,  Pulmonary and Critical Care Medicine Staff Physician, [place] Director - Interstitial Lung Disease  Program  Pulmonary [place] [facility] [place], [place], [address]  Pager: [phone], If no answer or between  15:00h - 7:00h: call [phone] Telephone: [phone]  6:38 PM 04/18/2020

## 2020-04-18 NOTE — Patient Instructions (Signed)
ICD-10-CM   1. Bronchiectasis without complication ([place])  C62.3     You have a condition called bronchiectasis.  The severity is mild. Sputum clearance is an important goal in your treatment.  Plan -You can continue Singulair Flonase and Claritin for your sinus allergies -For your lungs you can try Mucinex as needed to keep your sputum loose -Also try a sample Spiriva Respimat 2 puffs once daily - -If this is helping you you can call us and we can send a prescription -You need some blood work for looking at reasons for bronchiectasis  -Do blood ANA, CCP, SSA, SSB, alpha-1 phenotype   -Do blood immunoglobulin IgA, IgE, IgG and IgM  -Given mild nature of symptoms you do not need bronchoscopy at this stage -Give sputum for Gram stain and culture and AFB smear and culture when possible  Follow-up -Call us in a few weeks to tell us if the Spiriva is helping you -Do full pulmonary function test in 6 months -Return to see me in 6 months but after breathing test

## 2020-04-20 LAB — IGG, IGA, IGM
IgA: 213 mg/dL (ref 64–422)
IgG (Immunoglobin G), Serum: 1297 mg/dL (ref 586–1602)
IgM (Immunoglobulin M), Srm: 145 mg/dL (ref 26–217)

## 2020-04-21 ENCOUNTER — Other Ambulatory Visit
Admission: RE | Admit: 2020-04-21 | Discharge: 2020-04-21 | Disposition: A | Discharge status: Home / Self Care | Payer: PPO | Source: Ambulatory Visit | Attending: Internal Medicine | Admitting: Internal Medicine

## 2020-04-21 DIAGNOSIS — J479 Bronchiectasis, uncomplicated: Secondary | ICD-10-CM | POA: Diagnosis not present

## 2020-04-21 LAB — SJOGREN'S SYNDROME ANTIBODS(SSA + SSB)
SSA (Ro) (ENA) Antibody, IgG: <0.2 AI (ref 0.0–0.9)
SSB (La) (ENA) Antibody, IgG: 0.2 AI (ref 0.0–0.9)

## 2020-04-21 LAB — ANA W/REFLEX: Anti Nuclear Antibody (ANA): POSITIVE — AB

## 2020-04-21 NOTE — Addendum Note (Signed)
Addended by: Sunday Spillers on: 04/21/2020 02:14 PM   Modules accepted: Orders

## 2020-04-22 LAB — ALPHA-1 ANTITRYPSIN PHENOTYPE: A-1 Antitrypsin, Ser: 82 mg/dL — ABNORMAL LOW (ref 101–187)

## 2020-04-22 LAB — ACID FAST SMEAR (AFB, MYCOBACTERIA): Acid Fast Smear: NEGATIVE

## 2020-04-23 DIAGNOSIS — H43811 Vitreous degeneration, right eye: Secondary | ICD-10-CM | POA: Diagnosis not present

## 2020-04-24 DIAGNOSIS — M9903 Segmental and somatic dysfunction of lumbar region: Secondary | ICD-10-CM | POA: Diagnosis not present

## 2020-04-24 DIAGNOSIS — M5416 Radiculopathy, lumbar region: Secondary | ICD-10-CM | POA: Diagnosis not present

## 2020-04-24 DIAGNOSIS — M5136 Other intervertebral disc degeneration, lumbar region: Secondary | ICD-10-CM | POA: Diagnosis not present

## 2020-04-24 DIAGNOSIS — M41126 Adolescent idiopathic scoliosis, lumbar region: Secondary | ICD-10-CM | POA: Diagnosis not present

## 2020-04-25 DIAGNOSIS — M5136 Other intervertebral disc degeneration, lumbar region: Secondary | ICD-10-CM | POA: Diagnosis not present

## 2020-04-25 DIAGNOSIS — M9903 Segmental and somatic dysfunction of lumbar region: Secondary | ICD-10-CM | POA: Diagnosis not present

## 2020-04-25 DIAGNOSIS — M5416 Radiculopathy, lumbar region: Secondary | ICD-10-CM | POA: Diagnosis not present

## 2020-04-25 DIAGNOSIS — M41126 Adolescent idiopathic scoliosis, lumbar region: Secondary | ICD-10-CM | POA: Diagnosis not present

## 2020-04-28 DIAGNOSIS — M5136 Other intervertebral disc degeneration, lumbar region: Secondary | ICD-10-CM | POA: Diagnosis not present

## 2020-04-28 DIAGNOSIS — M41126 Adolescent idiopathic scoliosis, lumbar region: Secondary | ICD-10-CM | POA: Diagnosis not present

## 2020-04-28 DIAGNOSIS — M5416 Radiculopathy, lumbar region: Secondary | ICD-10-CM | POA: Diagnosis not present

## 2020-04-28 DIAGNOSIS — M9903 Segmental and somatic dysfunction of lumbar region: Secondary | ICD-10-CM | POA: Diagnosis not present

## 2020-04-29 LAB — MISC LABCORP TEST (SEND OUT): Labcorp test code: 16123

## 2020-04-30 ENCOUNTER — Other Ambulatory Visit: Payer: Self-pay | Admitting: Family Medicine

## 2020-04-30 DIAGNOSIS — M9903 Segmental and somatic dysfunction of lumbar region: Secondary | ICD-10-CM | POA: Diagnosis not present

## 2020-04-30 DIAGNOSIS — M5136 Other intervertebral disc degeneration, lumbar region: Secondary | ICD-10-CM | POA: Diagnosis not present

## 2020-04-30 DIAGNOSIS — M41126 Adolescent idiopathic scoliosis, lumbar region: Secondary | ICD-10-CM | POA: Diagnosis not present

## 2020-04-30 DIAGNOSIS — M5416 Radiculopathy, lumbar region: Secondary | ICD-10-CM | POA: Diagnosis not present

## 2020-04-30 DIAGNOSIS — J309 Allergic rhinitis, unspecified: Secondary | ICD-10-CM

## 2020-05-01 ENCOUNTER — Other Ambulatory Visit: Payer: Self-pay

## 2020-05-01 MED ORDER — IRBESARTAN 150 MG PO TABS: 150.0000 mg | ORAL_TABLET | Freq: Every day | ORAL | 6 refills | Status: DC

## 2020-05-01 MED ORDER — METOPROLOL TARTRATE 25 MG PO TABS: 25.0000 mg | ORAL_TABLET | Freq: Two times a day (BID) | ORAL | 6 refills | Status: DC

## 2020-05-02 DIAGNOSIS — M9903 Segmental and somatic dysfunction of lumbar region: Secondary | ICD-10-CM | POA: Diagnosis not present

## 2020-05-02 DIAGNOSIS — M5136 Other intervertebral disc degeneration, lumbar region: Secondary | ICD-10-CM | POA: Diagnosis not present

## 2020-05-02 DIAGNOSIS — M5416 Radiculopathy, lumbar region: Secondary | ICD-10-CM | POA: Diagnosis not present

## 2020-05-02 DIAGNOSIS — M41126 Adolescent idiopathic scoliosis, lumbar region: Secondary | ICD-10-CM | POA: Diagnosis not present

## 2020-05-05 DIAGNOSIS — M5416 Radiculopathy, lumbar region: Secondary | ICD-10-CM | POA: Diagnosis not present

## 2020-05-05 DIAGNOSIS — M5136 Other intervertebral disc degeneration, lumbar region: Secondary | ICD-10-CM | POA: Diagnosis not present

## 2020-05-05 DIAGNOSIS — M41126 Adolescent idiopathic scoliosis, lumbar region: Secondary | ICD-10-CM | POA: Diagnosis not present

## 2020-05-05 DIAGNOSIS — M9903 Segmental and somatic dysfunction of lumbar region: Secondary | ICD-10-CM | POA: Diagnosis not present

## 2020-05-06 ENCOUNTER — Telehealth: Payer: Self-pay | Admitting: Internal Medicine

## 2020-05-06 DIAGNOSIS — M5416 Radiculopathy, lumbar region: Secondary | ICD-10-CM | POA: Diagnosis not present

## 2020-05-06 DIAGNOSIS — M41126 Adolescent idiopathic scoliosis, lumbar region: Secondary | ICD-10-CM | POA: Diagnosis not present

## 2020-05-06 DIAGNOSIS — M5136 Other intervertebral disc degeneration, lumbar region: Secondary | ICD-10-CM | POA: Diagnosis not present

## 2020-05-06 DIAGNOSIS — M9903 Segmental and somatic dysfunction of lumbar region: Secondary | ICD-10-CM | POA: Diagnosis not present

## 2020-05-06 MED ORDER — SPIRIVA RESPIMAT 2.5 MCG/ACT IN AERS: 2.0000 | INHALATION_SPRAY | Freq: Every day | RESPIRATORY_TRACT | 5 refills | Status: DC

## 2020-05-06 NOTE — Telephone Encounter (Signed)
Rx for Sprivia 2.5 has been sent to preferred pharmacy.  Left detailed message making pt aware.   Will route to Dr. Chase Caller as an Juluis Rainier.

## 2020-05-08 DIAGNOSIS — M9903 Segmental and somatic dysfunction of lumbar region: Secondary | ICD-10-CM | POA: Diagnosis not present

## 2020-05-08 DIAGNOSIS — M5416 Radiculopathy, lumbar region: Secondary | ICD-10-CM | POA: Diagnosis not present

## 2020-05-08 DIAGNOSIS — M5136 Other intervertebral disc degeneration, lumbar region: Secondary | ICD-10-CM | POA: Diagnosis not present

## 2020-05-08 DIAGNOSIS — M41126 Adolescent idiopathic scoliosis, lumbar region: Secondary | ICD-10-CM | POA: Diagnosis not present

## 2020-05-12 ENCOUNTER — Other Ambulatory Visit: Payer: Self-pay | Admitting: Family Medicine

## 2020-05-12 DIAGNOSIS — M9903 Segmental and somatic dysfunction of lumbar region: Secondary | ICD-10-CM | POA: Diagnosis not present

## 2020-05-12 DIAGNOSIS — J309 Allergic rhinitis, unspecified: Secondary | ICD-10-CM

## 2020-05-12 DIAGNOSIS — M41126 Adolescent idiopathic scoliosis, lumbar region: Secondary | ICD-10-CM | POA: Diagnosis not present

## 2020-05-12 DIAGNOSIS — M5136 Other intervertebral disc degeneration, lumbar region: Secondary | ICD-10-CM | POA: Diagnosis not present

## 2020-05-12 DIAGNOSIS — M5416 Radiculopathy, lumbar region: Secondary | ICD-10-CM | POA: Diagnosis not present

## 2020-05-13 ENCOUNTER — Other Ambulatory Visit: Payer: Self-pay

## 2020-05-13 ENCOUNTER — Ambulatory Visit (INDEPENDENT_AMBULATORY_CARE_PROVIDER_SITE_OTHER): Payer: PPO | Admitting: Family Medicine

## 2020-05-13 ENCOUNTER — Encounter: Payer: Self-pay | Admitting: Family Medicine

## 2020-05-13 VITALS — BP 130/70 | HR 63 | Temp 98.4°F | Ht 61.0 in | Wt 162.8 lb

## 2020-05-13 DIAGNOSIS — M546 Pain in thoracic spine: Secondary | ICD-10-CM | POA: Insufficient documentation

## 2020-05-13 DIAGNOSIS — J309 Allergic rhinitis, unspecified: Secondary | ICD-10-CM

## 2020-05-13 DIAGNOSIS — M5432 Sciatica, left side: Secondary | ICD-10-CM | POA: Diagnosis not present

## 2020-05-13 DIAGNOSIS — I10 Essential (primary) hypertension: Secondary | ICD-10-CM

## 2020-05-13 LAB — BASIC METABOLIC PANEL
BUN: 28 mg/dL — ABNORMAL HIGH (ref 6–23)
CO2: 31 mEq/L (ref 19–32)
Calcium: 10.2 mg/dL (ref 8.4–10.5)
Chloride: 98 mEq/L (ref 96–112)
Creatinine, Ser: 0.92 mg/dL (ref 0.40–1.20)
GFR: 58.51 mL/min — ABNORMAL LOW (ref >60.00)
Glucose, Bld: 95 mg/dL (ref 70–99)
Potassium: 4.6 mEq/L (ref 3.5–5.1)
Sodium: 139 mEq/L (ref 135–145)

## 2020-05-13 MED ORDER — BACLOFEN 10 MG PO TABS
5.0000 mg | ORAL_TABLET | Freq: Two times a day (BID) | ORAL | 0 refills | Status: DC | PRN
Start: 2020-05-13 — End: 2020-11-18

## 2020-05-13 MED ORDER — PREDNISONE 20 MG PO TABS: 40.0000 mg | ORAL_TABLET | Freq: Every day | ORAL | 0 refills | Status: DC

## 2020-05-13 NOTE — Assessment & Plan Note (Signed)
Chronic issue. Continue current regimen. She would need to see an allergist for further treatment if needed in the future.

## 2020-05-13 NOTE — Patient Instructions (Signed)
Nice to see you. Please try the baclofen and the prednisone. If not helpful please let us know.

## 2020-05-13 NOTE — Progress Notes (Signed)
Tommi Rumps, MD Phone: [phone]  Morgan Byrd is a 84 y.o. female who presents today for f/u.  HYPERTENSION  Disease Monitoring  Home BP Monitoring 120s/60s Chest pain- no    Dyspnea- no Medications  Compliance-  Taking irbesartan, HCTZ, metoprolol.   Edema- no  Allergic rhinitis: patient reports chronic post-nasal drip. Hoarseness as well. Chronic cough related to bronchiectasis. Taking flonase, allegra, singulair, and astelin. Has seen ENT previously for this.   Back pain/sciatica: notes right thoracic muscle strain about a week ago. Possibly did this lifting her dog or husband. No numbness, weakness, or incontinence. She has chronic issues with sciatica that has been bothering her more recently. Has been going to the chiropractor for 2 weeks to see if that will help.     Social History   Tobacco Use  Smoking Status Former Smoker  . Packs/day: 1.00  . Years: 4.00  . Pack years: 4.00  . Types: Cigarettes  . Quit date: 59  . Years since quitting: 47.5  Smokeless Tobacco Never Used     ROS see history of present illness  Objective  Physical Exam Vitals:   05/13/20 1012  BP: 130/70  Pulse: 63  Temp: 98.4 F (36.9 C)  SpO2: 96%    BP Readings from Last 3 Encounters:  05/13/20 130/70  04/18/20 (!) 144/82  12/10/19 140/80   Wt Readings from Last 3 Encounters:  05/13/20 162 lb 12.8 oz (73.8 kg)  04/18/20 163 lb (73.9 kg)  04/08/20 162 lb (73.5 kg)    Physical Exam Constitutional:      General: She is not in acute distress.    Appearance: She is not diaphoretic.  Cardiovascular:     Rate and Rhythm: Normal rate and regular rhythm.     Heart sounds: Normal heart sounds.  Pulmonary:     Effort: Pulmonary effort is normal.     Breath sounds: Normal breath sounds.  Musculoskeletal:     Comments: No midline spine tenderness, no midline spine step off, no muscular back tenderness  Skin:    General: Skin is warm and dry.  Neurological:     Mental  Status: She is alert.     Comments: 5/5 strength in bilateral quads, hamstrings, plantar and dorsiflexion, sensation to light touch intact in bilateral LE, normal gait       Assessment/Plan: Please see individual problem list.  Sciatica Recent flare of this. Will trial prednisone to see if her symptoms resolve. If not improving consider PT evaluation.   Hypertension Well controlled. Continue current meds. Check BMET.   Allergic rhinitis Chronic issue. Continue current regimen. She would need to see an allergist for further treatment if needed in the future.   Thoracic back pain Suspect muscle strain. Trial baclofen and rest. She will monitor and let us know if not improving.    Orders Placed This Encounter  Procedures  . Basic Metabolic Panel (BMET)    Meds ordered this encounter  Medications  . predniSONE (DELTASONE) 20 MG tablet    Sig: Take 2 tablets (40 mg total) by mouth daily with breakfast.    Dispense:  10 tablet    Refill:  0  . baclofen (LIORESAL) 10 MG tablet    Sig: Take 0.5 tablets (5 mg total) by mouth 2 (two) times daily as needed for muscle spasms.    Dispense:  10 each    Refill:  0    This visit occurred during the SARS-CoV-2 public health emergency.  Safety  protocols were in place, including screening questions prior to the visit, additional usage of staff PPE, and extensive cleaning of exam room while observing appropriate contact time as indicated for disinfecting solutions.    Tommi Rumps, MD [place]

## 2020-05-13 NOTE — Assessment & Plan Note (Signed)
Well controlled. Continue current meds. Check BMET.

## 2020-05-13 NOTE — Assessment & Plan Note (Signed)
Recent flare of this. Will trial prednisone to see if her symptoms resolve. If not improving consider PT evaluation.

## 2020-05-13 NOTE — Assessment & Plan Note (Signed)
Suspect muscle strain. Trial baclofen and rest. She will monitor and let us know if not improving.

## 2020-05-14 DIAGNOSIS — M5136 Other intervertebral disc degeneration, lumbar region: Secondary | ICD-10-CM | POA: Diagnosis not present

## 2020-05-14 DIAGNOSIS — M5416 Radiculopathy, lumbar region: Secondary | ICD-10-CM | POA: Diagnosis not present

## 2020-05-14 DIAGNOSIS — M9903 Segmental and somatic dysfunction of lumbar region: Secondary | ICD-10-CM | POA: Diagnosis not present

## 2020-05-14 DIAGNOSIS — M41126 Adolescent idiopathic scoliosis, lumbar region: Secondary | ICD-10-CM | POA: Diagnosis not present

## 2020-05-15 DIAGNOSIS — M5416 Radiculopathy, lumbar region: Secondary | ICD-10-CM | POA: Diagnosis not present

## 2020-05-15 DIAGNOSIS — M41126 Adolescent idiopathic scoliosis, lumbar region: Secondary | ICD-10-CM | POA: Diagnosis not present

## 2020-05-15 DIAGNOSIS — M9903 Segmental and somatic dysfunction of lumbar region: Secondary | ICD-10-CM | POA: Diagnosis not present

## 2020-05-15 DIAGNOSIS — M5136 Other intervertebral disc degeneration, lumbar region: Secondary | ICD-10-CM | POA: Diagnosis not present

## 2020-05-16 NOTE — Addendum Note (Signed)
Addended by: Claudette Head A on: 05/16/2020 09:58 AM   Modules accepted: Orders

## 2020-05-19 DIAGNOSIS — M9903 Segmental and somatic dysfunction of lumbar region: Secondary | ICD-10-CM | POA: Diagnosis not present

## 2020-05-19 DIAGNOSIS — M41126 Adolescent idiopathic scoliosis, lumbar region: Secondary | ICD-10-CM | POA: Diagnosis not present

## 2020-05-19 DIAGNOSIS — M5136 Other intervertebral disc degeneration, lumbar region: Secondary | ICD-10-CM | POA: Diagnosis not present

## 2020-05-19 DIAGNOSIS — M5416 Radiculopathy, lumbar region: Secondary | ICD-10-CM | POA: Diagnosis not present

## 2020-05-21 ENCOUNTER — Telehealth: Payer: Self-pay | Admitting: Family Medicine

## 2020-05-21 DIAGNOSIS — R0781 Pleurodynia: Secondary | ICD-10-CM

## 2020-05-21 NOTE — Telephone Encounter (Signed)
Pt was seen on 7/6 and she thinks she needs an xray could this be put in or does she need an appt. She thinks it is cracked rib and is still in pain.

## 2020-05-21 NOTE — Telephone Encounter (Signed)
I called and spoke with the patient and informed her that she can come in for an xray at her convenience.  Gennavieve Huq,cma

## 2020-05-21 NOTE — Addendum Note (Signed)
Addended by: Leone Haven on: 05/21/2020 04:42 PM   Modules accepted: Orders

## 2020-05-21 NOTE — Telephone Encounter (Signed)
X-ray ordered. She should be able to come in for this at her convenience.

## 2020-05-22 ENCOUNTER — Other Ambulatory Visit: Payer: Self-pay

## 2020-05-22 ENCOUNTER — Ambulatory Visit (INDEPENDENT_AMBULATORY_CARE_PROVIDER_SITE_OTHER): Payer: PPO

## 2020-05-22 ENCOUNTER — Other Ambulatory Visit: Payer: PPO

## 2020-05-22 ENCOUNTER — Other Ambulatory Visit: Payer: Self-pay | Admitting: Family Medicine

## 2020-05-22 DIAGNOSIS — M5416 Radiculopathy, lumbar region: Secondary | ICD-10-CM | POA: Diagnosis not present

## 2020-05-22 DIAGNOSIS — M9903 Segmental and somatic dysfunction of lumbar region: Secondary | ICD-10-CM | POA: Diagnosis not present

## 2020-05-22 DIAGNOSIS — M5136 Other intervertebral disc degeneration, lumbar region: Secondary | ICD-10-CM | POA: Diagnosis not present

## 2020-05-22 DIAGNOSIS — R0781 Pleurodynia: Secondary | ICD-10-CM

## 2020-05-22 DIAGNOSIS — S2242XA Multiple fractures of ribs, left side, initial encounter for closed fracture: Secondary | ICD-10-CM | POA: Diagnosis not present

## 2020-05-22 DIAGNOSIS — M41126 Adolescent idiopathic scoliosis, lumbar region: Secondary | ICD-10-CM | POA: Diagnosis not present

## 2020-05-23 ENCOUNTER — Telehealth: Payer: Self-pay | Admitting: Family Medicine

## 2020-05-23 NOTE — Telephone Encounter (Signed)
It has not been read yet. We will contact her once the radiologist reads the x-ray.

## 2020-05-23 NOTE — Telephone Encounter (Signed)
Thank you Jeanne Ivan! I called him :)

## 2020-05-23 NOTE — Telephone Encounter (Signed)
Patient was calling for her x-ray results from yesterday.

## 2020-05-23 NOTE — Telephone Encounter (Signed)
[facility] called to speak with lab about pt x-ray can contact him at [phone]

## 2020-05-24 NOTE — Telephone Encounter (Signed)
I called and spoke with the patient and informed her of her lab results. Patient understood. Dezman Granda,cma

## 2020-05-26 DIAGNOSIS — M41126 Adolescent idiopathic scoliosis, lumbar region: Secondary | ICD-10-CM | POA: Diagnosis not present

## 2020-05-26 DIAGNOSIS — M5136 Other intervertebral disc degeneration, lumbar region: Secondary | ICD-10-CM | POA: Diagnosis not present

## 2020-05-26 DIAGNOSIS — M9903 Segmental and somatic dysfunction of lumbar region: Secondary | ICD-10-CM | POA: Diagnosis not present

## 2020-05-26 DIAGNOSIS — M5416 Radiculopathy, lumbar region: Secondary | ICD-10-CM | POA: Diagnosis not present

## 2020-05-28 DIAGNOSIS — M9903 Segmental and somatic dysfunction of lumbar region: Secondary | ICD-10-CM | POA: Diagnosis not present

## 2020-05-28 DIAGNOSIS — M5136 Other intervertebral disc degeneration, lumbar region: Secondary | ICD-10-CM | POA: Diagnosis not present

## 2020-05-28 DIAGNOSIS — M5416 Radiculopathy, lumbar region: Secondary | ICD-10-CM | POA: Diagnosis not present

## 2020-05-28 DIAGNOSIS — M41126 Adolescent idiopathic scoliosis, lumbar region: Secondary | ICD-10-CM | POA: Diagnosis not present

## 2020-05-29 DIAGNOSIS — M41126 Adolescent idiopathic scoliosis, lumbar region: Secondary | ICD-10-CM | POA: Diagnosis not present

## 2020-05-29 DIAGNOSIS — M9903 Segmental and somatic dysfunction of lumbar region: Secondary | ICD-10-CM | POA: Diagnosis not present

## 2020-05-29 DIAGNOSIS — M5416 Radiculopathy, lumbar region: Secondary | ICD-10-CM | POA: Diagnosis not present

## 2020-05-29 DIAGNOSIS — M5136 Other intervertebral disc degeneration, lumbar region: Secondary | ICD-10-CM | POA: Diagnosis not present

## 2020-06-02 DIAGNOSIS — M41126 Adolescent idiopathic scoliosis, lumbar region: Secondary | ICD-10-CM | POA: Diagnosis not present

## 2020-06-02 DIAGNOSIS — M5136 Other intervertebral disc degeneration, lumbar region: Secondary | ICD-10-CM | POA: Diagnosis not present

## 2020-06-02 DIAGNOSIS — M5416 Radiculopathy, lumbar region: Secondary | ICD-10-CM | POA: Diagnosis not present

## 2020-06-02 DIAGNOSIS — M9903 Segmental and somatic dysfunction of lumbar region: Secondary | ICD-10-CM | POA: Diagnosis not present

## 2020-06-04 ENCOUNTER — Other Ambulatory Visit: Payer: Self-pay

## 2020-06-04 DIAGNOSIS — J309 Allergic rhinitis, unspecified: Secondary | ICD-10-CM

## 2020-06-04 LAB — ACID FAST CULTURE WITH REFLEXED SENSITIVITIES (MYCOBACTERIA): Acid Fast Culture: NEGATIVE

## 2020-06-04 MED ORDER — FLUTICASONE PROPIONATE 50 MCG/ACT NA SUSP: NASAL | 1 refills | Status: DC

## 2020-06-05 DIAGNOSIS — M41126 Adolescent idiopathic scoliosis, lumbar region: Secondary | ICD-10-CM | POA: Diagnosis not present

## 2020-06-05 DIAGNOSIS — M9903 Segmental and somatic dysfunction of lumbar region: Secondary | ICD-10-CM | POA: Diagnosis not present

## 2020-06-05 DIAGNOSIS — M5136 Other intervertebral disc degeneration, lumbar region: Secondary | ICD-10-CM | POA: Diagnosis not present

## 2020-06-05 DIAGNOSIS — M5416 Radiculopathy, lumbar region: Secondary | ICD-10-CM | POA: Diagnosis not present

## 2020-06-10 DIAGNOSIS — M9903 Segmental and somatic dysfunction of lumbar region: Secondary | ICD-10-CM | POA: Diagnosis not present

## 2020-06-10 DIAGNOSIS — M5416 Radiculopathy, lumbar region: Secondary | ICD-10-CM | POA: Diagnosis not present

## 2020-06-10 DIAGNOSIS — M5136 Other intervertebral disc degeneration, lumbar region: Secondary | ICD-10-CM | POA: Diagnosis not present

## 2020-06-10 DIAGNOSIS — M41126 Adolescent idiopathic scoliosis, lumbar region: Secondary | ICD-10-CM | POA: Diagnosis not present

## 2020-06-12 DIAGNOSIS — M5136 Other intervertebral disc degeneration, lumbar region: Secondary | ICD-10-CM | POA: Diagnosis not present

## 2020-06-12 DIAGNOSIS — M41126 Adolescent idiopathic scoliosis, lumbar region: Secondary | ICD-10-CM | POA: Diagnosis not present

## 2020-06-12 DIAGNOSIS — M5416 Radiculopathy, lumbar region: Secondary | ICD-10-CM | POA: Diagnosis not present

## 2020-06-12 DIAGNOSIS — M9903 Segmental and somatic dysfunction of lumbar region: Secondary | ICD-10-CM | POA: Diagnosis not present

## 2020-06-18 DIAGNOSIS — M5416 Radiculopathy, lumbar region: Secondary | ICD-10-CM | POA: Diagnosis not present

## 2020-06-18 DIAGNOSIS — M41126 Adolescent idiopathic scoliosis, lumbar region: Secondary | ICD-10-CM | POA: Diagnosis not present

## 2020-06-18 DIAGNOSIS — M9903 Segmental and somatic dysfunction of lumbar region: Secondary | ICD-10-CM | POA: Diagnosis not present

## 2020-06-18 DIAGNOSIS — M5136 Other intervertebral disc degeneration, lumbar region: Secondary | ICD-10-CM | POA: Diagnosis not present

## 2020-06-20 DIAGNOSIS — M9903 Segmental and somatic dysfunction of lumbar region: Secondary | ICD-10-CM | POA: Diagnosis not present

## 2020-06-20 DIAGNOSIS — M5416 Radiculopathy, lumbar region: Secondary | ICD-10-CM | POA: Diagnosis not present

## 2020-06-20 DIAGNOSIS — M41126 Adolescent idiopathic scoliosis, lumbar region: Secondary | ICD-10-CM | POA: Diagnosis not present

## 2020-06-20 DIAGNOSIS — M5136 Other intervertebral disc degeneration, lumbar region: Secondary | ICD-10-CM | POA: Diagnosis not present

## 2020-06-22 ENCOUNTER — Telehealth: Payer: Self-pay | Admitting: Internal Medicine

## 2020-06-22 NOTE — Telephone Encounter (Signed)
Sorry for delay in results. 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   Plan   - keep followup plan of doing PFT in 6 months from June 2021 visit - glad she quit smoking - will discuss genetic implications at followup but I think her kids and blood relative should get tested for alpha 1 (non urgent)  -  reports that she quit smoking about 47 years ago. Her smoking use included cigarettes. She has a 4.00 pack-year smoking history. She has never used smokeless tobacco.

## 2020-06-24 DIAGNOSIS — M41126 Adolescent idiopathic scoliosis, lumbar region: Secondary | ICD-10-CM | POA: Diagnosis not present

## 2020-06-24 DIAGNOSIS — M9903 Segmental and somatic dysfunction of lumbar region: Secondary | ICD-10-CM | POA: Diagnosis not present

## 2020-06-24 DIAGNOSIS — M5136 Other intervertebral disc degeneration, lumbar region: Secondary | ICD-10-CM | POA: Diagnosis not present

## 2020-06-24 DIAGNOSIS — M5416 Radiculopathy, lumbar region: Secondary | ICD-10-CM | POA: Diagnosis not present

## 2020-06-25 NOTE — Telephone Encounter (Signed)
Called and spoke with pt letting her know the info stated by MR and she verbalized understanding. Pt stated that she would make her children aware.  Pt wanted to know if she should get the booster covid shot. MR, please advise.

## 2020-06-25 NOTE — Telephone Encounter (Signed)
MR, please advise that you meant to say that the genes is wean or if it meant to be something else. If so, can you help interpret this if this means that it is normal or if it means that pt does carry the gene for underlying lung conditions.

## 2020-06-25 NOTE — Telephone Encounter (Signed)
Alpha 1 MZ . M is normal gene. Z is abnormal/weak gene . It is contriubting to her hx of copd and family hx  Sorry for typo

## 2020-06-26 DIAGNOSIS — M5136 Other intervertebral disc degeneration, lumbar region: Secondary | ICD-10-CM | POA: Diagnosis not present

## 2020-06-26 DIAGNOSIS — M5416 Radiculopathy, lumbar region: Secondary | ICD-10-CM | POA: Diagnosis not present

## 2020-06-26 DIAGNOSIS — M41126 Adolescent idiopathic scoliosis, lumbar region: Secondary | ICD-10-CM | POA: Diagnosis not present

## 2020-06-26 DIAGNOSIS — M9903 Segmental and somatic dysfunction of lumbar region: Secondary | ICD-10-CM | POA: Diagnosis not present

## 2020-06-26 NOTE — Telephone Encounter (Signed)
  If she is on chronic daily prednisone or has active cancer then yes for booster - otherwise as of now - no need for covid bosster   Immunization History  Administered Date(s) Administered  . Fluad Quad(high Dose 65+) 07/25/2019  . Influenza, High Dose Seasonal PF 08/05/2016, 08/08/2017, 08/14/2018  . Influenza,inj,Quad PF,6+ Mos 08/24/2014, 08/05/2015  . Influenza-Unspecified 08/08/2012, 08/11/2013  . PFIZER SARS-COV-2 Vaccination 11/14/2019, 12/05/2019  . Pneumococcal Conjugate-13 05/03/2014  . Pneumococcal Polysaccharide-23 04/25/2005  . Tdap 04/25/2012, 10/04/2019  . Zoster 06/26/2011  . Zoster Recombinat (Shingrix) 08/08/2017     Allergies  Allergen Reactions  . Codeine Nausea And Vomiting  . Fosamax [Alendronate Sodium]   . Lexapro [Escitalopram Oxalate]      Current Outpatient Medications:  .  ALLERGY RELIEF 180 MG tablet, TAKE ONE TABLET EVERY DAY, Disp: 90 tablet, Rfl: 1 .  aspirin 81 MG EC tablet, Take 81 mg by mouth daily. Swallow whole., Disp: , Rfl:  .  B Complex-C (SUPER B COMPLEX PO), Take by mouth daily. , Disp: , Rfl:  .  baclofen (LIORESAL) 10 MG tablet, Take 0.5 tablets (5 mg total) by mouth 2 (two) times daily as needed for muscle spasms., Disp: 10 each, Rfl: 0 .  CALCIUM-MAGNESIUM-ZINC PO, Take by mouth daily. , Disp: , Rfl:  .  cholecalciferol (VITAMIN D) 1000 UNITS tablet, Take 1,000 Units by mouth daily., Disp: , Rfl:  .  fluticasone (FLONASE) 50 MCG/ACT nasal spray, TAKE 2 SPRAYS INTO BOTH NOSTRILS DAILY, Disp: 16 g, Rfl: 1 .  hydrochlorothiazide (MICROZIDE) 12.5 MG capsule, TAKE 1 CAPSULE EVERY DAY, Disp: 90 capsule, Rfl: 3 .  irbesartan (AVAPRO) 150 MG tablet, Take 1 tablet (150 mg total) by mouth daily., Disp: 30 tablet, Rfl: 6 .  methimazole (TAPAZOLE) 5 MG tablet, Take 2.5 mg by mouth daily., Disp: , Rfl:  .  metoprolol tartrate (LOPRESSOR) 25 MG tablet, Take 1 tablet (25 mg total) by mouth 2 (two) times daily., Disp: 60 tablet, Rfl: 6 .   montelukast (SINGULAIR) 10 MG tablet, TAKE ONE TABLET BY MOUTH AT BEDTIME, Disp: 90 tablet, Rfl: 1 .  Multiple Vitamin (MULTIVITAMIN) capsule, Take 1 capsule by mouth daily., Disp: , Rfl:  .  predniSONE (DELTASONE) 20 MG tablet, Take 2 tablets (40 mg total) by mouth daily with breakfast., Disp: 10 tablet, Rfl: 0 .  risedronate (ACTONEL) 35 MG tablet, TAKE 1 TABLET EVERY 7 DAYS WITH A FULL GLASS OF WATER ON AN EMPTY STOMACH DO NOT LIE DOWN FOR AT LEAST 30 MIN, Disp: 12 tablet, Rfl: 1 .  Tiotropium Bromide Monohydrate (SPIRIVA RESPIMAT) 2.5 MCG/ACT AERS, Inhale 2 puffs into the lungs daily., Disp: 4 g, Rfl: 5    has a past medical history of Anxiety and Hypertension.

## 2020-06-27 NOTE — Telephone Encounter (Signed)
Attempted to call pt but unable to reach. Left message for her to return call. 

## 2020-06-30 MED ORDER — INCRUSE ELLIPTA 62.5 MCG/INH IN AEPB
1.0000 | INHALATION_SPRAY | Freq: Every day | RESPIRATORY_TRACT | 6 refills | Status: DC
Start: 2020-06-30 — End: 2020-11-18

## 2020-06-30 NOTE — Telephone Encounter (Signed)
Called and spoke with pt letting her know the info stated by MR about the covid booster and she verbalized understanding.  Pt stated that the Spiriva inhaler has been making her feel dizzy. She stated that she was taking it but due to the feeling that she was getting, she stopped taking it but then started back on it. Pt stated once she started it back, she did begin to feel dizzy again. Pt wants to know what could be recommended due to how she is feeling on the Spiriva. MR, please advise.

## 2020-06-30 NOTE — Telephone Encounter (Signed)
Stop spiriva - mark dizziness  Can try incruse - we do not have sample. Take 1 daily. She will need to price this

## 2020-06-30 NOTE — Telephone Encounter (Signed)
Called and spoke with patient to let her know that Dr. Chase Caller wants her to stop Spiriva and that we are going to switch her to Incruse. Patient expressed understanding. Patient verified correct pharmacy. RX sent in. Nothing further needed at this time.

## 2020-07-01 DIAGNOSIS — M5416 Radiculopathy, lumbar region: Secondary | ICD-10-CM | POA: Diagnosis not present

## 2020-07-01 DIAGNOSIS — M9903 Segmental and somatic dysfunction of lumbar region: Secondary | ICD-10-CM | POA: Diagnosis not present

## 2020-07-01 DIAGNOSIS — M41126 Adolescent idiopathic scoliosis, lumbar region: Secondary | ICD-10-CM | POA: Diagnosis not present

## 2020-07-01 DIAGNOSIS — M5136 Other intervertebral disc degeneration, lumbar region: Secondary | ICD-10-CM | POA: Diagnosis not present

## 2020-07-08 ENCOUNTER — Telehealth: Payer: Self-pay | Admitting: Family Medicine

## 2020-07-08 DIAGNOSIS — M5136 Other intervertebral disc degeneration, lumbar region: Secondary | ICD-10-CM | POA: Diagnosis not present

## 2020-07-08 DIAGNOSIS — M5440 Lumbago with sciatica, unspecified side: Secondary | ICD-10-CM

## 2020-07-08 DIAGNOSIS — M41126 Adolescent idiopathic scoliosis, lumbar region: Secondary | ICD-10-CM | POA: Diagnosis not present

## 2020-07-08 DIAGNOSIS — M9903 Segmental and somatic dysfunction of lumbar region: Secondary | ICD-10-CM | POA: Diagnosis not present

## 2020-07-08 DIAGNOSIS — M5416 Radiculopathy, lumbar region: Secondary | ICD-10-CM | POA: Diagnosis not present

## 2020-07-08 NOTE — Telephone Encounter (Signed)
We discussed back pain and sciatica. What leg symptoms is she having currently? Did her symptoms improve with the prednisone at all? Is she having any numbness or weakness?

## 2020-07-08 NOTE — Telephone Encounter (Signed)
Pt said she saw Dr. Caryl Bis last month about her legs. She said going to chiropractor isn't working. She would like for Dr. Caryl Bis to order a MRI. She said sometimes she can hardly walk without crying. She would like a call back.

## 2020-07-08 NOTE — Telephone Encounter (Signed)
Noted. The first step would be an x-ray and then referral to PT. Her insurance may not cover an MRI this early in the course of her symptoms without undergoing further treatment for her symptoms with PT.

## 2020-07-08 NOTE — Telephone Encounter (Signed)
The patient stated she ssaw you last month about her legs and she was going to a chiropractor and it is not helping her legs, she would like for you to order a MRI of her legs, they are so painful sometimes she can't walk without crying.  Johnney Scarlata,cma

## 2020-07-08 NOTE — Telephone Encounter (Signed)
I called and spoke with the patient and she stated the pain is in her hip radiating down her leg to above her ankle, I think she said the right leg she stated prednisone did not help at all. She has no numbness or weakness, just pain.  Hameed Kolar,cma

## 2020-07-09 NOTE — Telephone Encounter (Signed)
I called and spoke with the patient and she is okay with the Xray and the PT.  Just let me know where she is to do the xray and I will call her and let her know.  Morgan Byrd,cma

## 2020-07-09 NOTE — Addendum Note (Signed)
Addended by: Caryl Bis, Ladaja Yusupov G on: 07/09/2020 02:09 PM   Modules accepted: Orders

## 2020-07-09 NOTE — Telephone Encounter (Signed)
Order placed.  She should be able to get it in the office as long she can pass the screening questions.

## 2020-07-10 ENCOUNTER — Other Ambulatory Visit: Payer: Self-pay

## 2020-07-10 ENCOUNTER — Ambulatory Visit (INDEPENDENT_AMBULATORY_CARE_PROVIDER_SITE_OTHER): Payer: PPO

## 2020-07-10 ENCOUNTER — Other Ambulatory Visit: Payer: PPO

## 2020-07-10 DIAGNOSIS — M5440 Lumbago with sciatica, unspecified side: Secondary | ICD-10-CM

## 2020-07-10 DIAGNOSIS — M545 Low back pain: Secondary | ICD-10-CM | POA: Diagnosis not present

## 2020-07-10 NOTE — Telephone Encounter (Signed)
I called the patient and scheduled xray only for this afternoon per provider.  Jolane Bankhead,cma

## 2020-07-17 ENCOUNTER — Telehealth: Payer: Self-pay | Admitting: Family Medicine

## 2020-07-17 NOTE — Telephone Encounter (Signed)
Patient called in for her results from x-rays

## 2020-07-21 DIAGNOSIS — S12190D Other displaced fracture of second cervical vertebra, subsequent encounter for fracture with routine healing: Secondary | ICD-10-CM | POA: Diagnosis not present

## 2020-07-21 DIAGNOSIS — M47812 Spondylosis without myelopathy or radiculopathy, cervical region: Secondary | ICD-10-CM | POA: Diagnosis not present

## 2020-07-21 DIAGNOSIS — M4312 Spondylolisthesis, cervical region: Secondary | ICD-10-CM | POA: Diagnosis not present

## 2020-07-21 DIAGNOSIS — M48062 Spinal stenosis, lumbar region with neurogenic claudication: Secondary | ICD-10-CM | POA: Diagnosis not present

## 2020-07-21 DIAGNOSIS — M8580 Other specified disorders of bone density and structure, unspecified site: Secondary | ICD-10-CM | POA: Diagnosis not present

## 2020-07-21 DIAGNOSIS — M4316 Spondylolisthesis, lumbar region: Secondary | ICD-10-CM | POA: Diagnosis not present

## 2020-07-21 DIAGNOSIS — M4180 Other forms of scoliosis, site unspecified: Secondary | ICD-10-CM | POA: Diagnosis not present

## 2020-07-21 DIAGNOSIS — W010XXD Fall on same level from slipping, tripping and stumbling without subsequent striking against object, subsequent encounter: Secondary | ICD-10-CM | POA: Diagnosis not present

## 2020-07-23 NOTE — Telephone Encounter (Signed)
Per xray result note; patient is aware of results per Gae Bon.

## 2020-07-29 ENCOUNTER — Encounter: Payer: Self-pay | Admitting: Physical Therapy

## 2020-07-29 ENCOUNTER — Ambulatory Visit: Payer: PPO | Attending: Family Medicine | Admitting: Physical Therapy

## 2020-07-29 ENCOUNTER — Other Ambulatory Visit: Payer: Self-pay

## 2020-07-29 DIAGNOSIS — R2689 Other abnormalities of gait and mobility: Secondary | ICD-10-CM | POA: Insufficient documentation

## 2020-07-29 DIAGNOSIS — G8929 Other chronic pain: Secondary | ICD-10-CM | POA: Diagnosis not present

## 2020-07-29 DIAGNOSIS — M5442 Lumbago with sciatica, left side: Secondary | ICD-10-CM | POA: Insufficient documentation

## 2020-07-29 DIAGNOSIS — R293 Abnormal posture: Secondary | ICD-10-CM | POA: Diagnosis not present

## 2020-07-29 NOTE — Therapy (Addendum)
[facility] [address], [place], [address] Phone: [phone]   Fax:  [phone]  Physical Therapy Evaluation  Patient Details  Name: Morgan Byrd MRN: [id number] Date of Birth: 1936-09-02 No data recorded  Encounter Date: 07/29/2020   PT End of Session - 07/29/20 1322    Visit Number 1    Number of Visits 17    Date for PT Re-Evaluation 09/26/20    PT Start Time 0100    PT Stop Time 0200    PT Time Calculation (min) 60 min    Activity Tolerance Patient tolerated treatment well    Behavior During Therapy [facility] for tasks assessed/performed           Past Medical History:  Diagnosis Date  . Anxiety   . Hypertension     Past Surgical History:  Procedure Laterality Date  . ABDOMINAL HYSTERECTOMY    . BLADDER SUSPENSION    . BREAST BIOPSY Right 1978   neg  . COLONOSCOPY WITH PROPOFOL N/A 12/26/2015   Procedure: COLONOSCOPY WITH PROPOFOL;  Surgeon: Manya Silvas, MD;  Location: [facility] ENDOSCOPY;  Service: Endoscopy;  Laterality: N/A;  . NOSE SURGERY    . REPLACEMENT TOTAL KNEE BILATERAL    . SHOULDER SURGERY    . VAGINAL DELIVERY     2    There were no vitals filed for this visit.       OBJECTIVE  Mental Status Patient is oriented to person, place and time.  Recent memory is intact.  Remote memory is intact.  Attention span and concentration are intact.  Expressive speech is intact.  Patient's fund of knowledge is within normal limits for educational level.  SENSATION: Grossly intact to light touch bilateral LEs as determined by testing dermatomes L2-S2 Proprioception and hot/cold testing deferred on this date   MUSCULOSKELETAL: Tremor: None Bulk: Normal Tone: Normal No visible step-off along spinal column  Posture Lumbar lordosis: increased Iliac crest height: lower L illiac creast Lower crossed syndrome (tight hip flexors and erector spinae; weak gluts and abs):  positive Lumbar levoscolosis with mild R shoulder drop correction   Gait Forward trunk lean, decreased hip ext bilat, good heel strike bilat   Palpation  Reports pain was at palpation over lower L lumbar paraspinals and superior L glute musculature with no pain to date, latent trigger points  Strength (out of 5) R/L 4-/4- Hip flexion 3+/3+ Hip ER 5/5 Hip IR 4-/4- Hip abduction 5/5 Hip adduction 5/5 Hip extension 3/3 Knee extension 5/5 Knee flexion 5/5 Ankle dorsiflexion 5/5 Ankle plantarflexion 5 Trunk flexion 5 Trunk extension 5/5 Trunk rotation  *Indicates pain   AROM (degrees) R/L (all movements include overpressure unless otherwise stated) All lumbar and thoracic motions WNL All hip motions WNL except 50% bilat hip ext Knee flex 110d bilat *Indicates pain   PROM (degrees) All PROM lumbar and hip motions   Repeated Movements No centralization or peripheralization of symptoms with repeated lumbar extension or flexion.    Muscle Length Hamstrings: Lacking approx 20d bilat [place]: Positive bilat Thomas: Mild positive bilat  [place]ber: Negative bilat   Passive Accessory Intervertebral Motion (PAIVM) Pt denies reproduction of back pain with CPA L1-L5 and UPA bilaterally L1-L5. Generally hypomobile throughout  Passive Physiological Intervertebral Motion (PPIVM) Normal flexion and extension with PPIVM testing   SPECIAL TESTS Lumbar Radiculopathy and Discogenic: Centralization and Peripheralization (SN 92, -LR 0.12): Negative bilat Slump (SN 83, -LR 0.32): Negative  SLR (  SN 92, -LR 0.29): Negative Crossed SLR (SP 90): Negative   Facet Joint: Extension-Rotation (SN 100, -LR 0.0): R: Negative bilat  Lumbar Spinal Stenosis: Lumbar quadrant (SN 70): Negative bilat  Hip: FABER (SN 81): WNL bilat FADIR (SN 94): WNL bilat Hip scour (SN 50): WNL bilat  SIJ:  Thigh Thrust (SN 88, -LR 0.18) :Negative bilat  Piriformis Syndrome: FAIR Test (SN 88, SP 83): Negative  bilat  Functional Tasks Squatting: bilat knee valgus increase RLE push off for stand with L lateral shift Sit to stand: able to stand without UE with increased effort, without UEs decreased control in final 10% of sit 5xSTS 17sec  SLS L 2sec R unable Tandem unable bilat Narrow BOS 30sec Standing eyes open: 30sec eyes closed 30sec Standing on foam eyes open: 30sec eyes closed: 30sec max postural sway  6MWT 1169ft with 3/10 LLE pain 10MWT 0.54m/s   Ther-Ex PT reviewed the following HEP with patient with patient able to demonstrate a set of the following with min cuing for correction needed. PT educated patient on parameters of therex (how/when to inc/decrease intensity, frequency, rep/set range, stretch hold time, and purpose of therex) with verbalized understanding.  Access Code: [email or web address] Modified Thomas Stretch - 2-3 x daily - 7 x weekly - 30-60 hold Supine Bridge - 1 x daily - 7 x weekly - 3 sets - 6-10 reps Sit to Stand without Arm Support - 1 x daily - 7 x weekly - 3 sets - 6-10 reps                 Objective measurements completed on examination: See above findings.               PT Education - 07/29/20 1321    Education Details Patient was educated on diagnosis, anatomy and pathology involved, prognosis, role of PT, and was given an HEP, demonstrating exercise with proper form following verbal and tactile cues, and was given a paper hand out to continue exercise at home. Pt was educated on and agreed to plan of care.    Person(s) Educated Patient    Methods Explanation;Handout;Demonstration;Tactile cues;Verbal cues    Comprehension Verbalized understanding;Returned demonstration;Verbal cues required;Tactile cues required            PT Short Term Goals - 07/29/20 1407      PT SHORT TERM GOAL #1   Title Pt will be independent with HEP in order to improve strength and decrease back pain in order to improve pain-free function at home and work.     Baseline 07/29/20 HEP given    Time 4    Period Weeks    Status New             PT Long Term Goals - 07/29/20 1401      PT LONG TERM GOAL #1   Title Pt will demonstrate SLS of 10sec or more to demonstrate age matched norms for decreased fall risk    Baseline 07/29/20 L 2sec R: unable    Time 8    Period Weeks    Status New      PT LONG TERM GOAL #2   Title Pt will increase 6MWT by at least 36m (124ft) in order to demonstrate clinically significant improvement in cardiopulmonary and BLE endurance and community ambulation    Baseline 07/29/20 1114ft 3/10 pain in LLE    Time 8    Period Weeks    Status New      PT LONG TERM  GOAL #3   Title Pt will demonstrate gait speed of at least 1.76m/s to demonstrate safe community ambulation speed    Baseline 07/29/20 0.55m/s    Time 8    Period Weeks    Status New      PT LONG TERM GOAL #4   Title Patient will increase FOTO score to 68 to demonstrate predicted increase in functional mobility to complete ADLs    Baseline 07/29/20 58    Time 8    Period Weeks    Status New      PT LONG TERM GOAL #5   Title Pt will decrease 5TSTS by at least 3 seconds in order to demonstrate clinically significant improvement in LE strength    Baseline 07/29/20 17sec    Time 8    Period Weeks    Status New                  Plan - 07/29/20 1409    Clinical Impression Statement Pt is a 84 year old female presenting with L sided LBP with L sciatica, has resolved over the past week with predisone taper. Impairments in L LBP and LLE pain, core strength, hip strength (ext and abd), postural deficits, decreased upright tolerance/endurance, and gait abnomalities. Activitiy limitations in walking, standing, lifting, and squatting; inhibiting participation in walking dog, household chores, and caretaking for her disabled husband. Pt will benefit from skilled PT to address impairments to increase ind in function.    Personal Factors and Comorbidities  Age;Comorbidity 1;Comorbidity 2;Comorbidity 3+;Past/Current Experience;Time since onset of injury/illness/exacerbation;Fitness    Comorbidities HTN, neuropathy, chronic LBP, anxiety    Examination-Activity Limitations Squat;Carry;Lift;Stairs;Stand;Locomotion Level;Bend;Transfers;Caring for Others    Examination-Participation Restrictions Community Activity;Cleaning;Laundry;Meal Prep;Other    Stability/Clinical Decision Making Evolving/Moderate complexity    Clinical Decision Making Moderate    Rehab Potential Good    PT Frequency 2x / week    PT Duration 8 weeks    PT Treatment/Interventions ADLs/Self Care Home Management;Stair training;Therapeutic exercise;Spinal Manipulations;Joint Manipulations;Dry needling;Manual techniques;Passive range of motion;Patient/family education;Balance training;Neuromuscular re-education;Therapeutic activities;Gait training;Ultrasound;Traction;Electrical Stimulation;Moist Heat;Functional mobility training;Iontophoresis 4mg /ml Dexamethasone;Cryotherapy    PT Next Visit Plan HEP review, core/hip ext strengthening    PT Home Exercise Plan bridge, STS without UE, thomas stretch    Consulted and Agree with Plan of Care Patient           Patient will benefit from skilled therapeutic intervention in order to improve the following deficits and impairments:  Abnormal gait, Decreased activity tolerance, Decreased endurance, Decreased range of motion, Decreased strength, Increased fascial restricitons, Improper body mechanics, Pain, Postural dysfunction, Impaired flexibility, Increased muscle spasms, Difficulty walking, Decreased mobility, Decreased coordination, Decreased balance  Visit Diagnosis: Chronic left-sided low back pain with left-sided sciatica  Abnormal posture  Other abnormalities of gait and mobility     Problem List Patient Active Problem List   Diagnosis Date Noted  . Thoracic back pain 05/13/2020  . Chronic anxiety 11/14/2019  . Degenerative  arthritis of knee, bilateral 11/14/2019  . Disorder of vein 11/14/2019  . Fibrocystic breast disease 11/14/2019  . Heartburn 11/14/2019  . Knee pain 11/14/2019  . Localized, primary osteoarthritis 11/14/2019  . Pain 11/14/2019  . Osteoarthritis of knee 11/14/2019  . C2 cervical fracture ([place]) 11/14/2019  . Bigeminy 11/14/2019  . Nasal fracture 11/14/2019  . History of closed fracture of nasal bones 10/11/2019  . Toe injury 07/25/2019  . Multinodular goiter 06/07/2019  . Subclinical hyperthyroidism 05/14/2019  . Bronchiectasis ([place]) 05/14/2019  .  Sciatica 05/14/2019  . Thyroid nodule 04/26/2019  . Lung nodule 04/05/2019  . Bilateral lower extremity edema 02/21/2019  . Full thickness rotator cuff tear 01/02/2019  . Left shoulder pain 11/13/2018  . Femoral bruit 06/01/2018  . Allergic rhinitis 05/02/2018  . Obesity (BMI 30.0-34.9) 11/03/2017  . Anxiety 08/05/2016  . Neuropathy 02/02/2016  . Dysphagia 10/09/2012  . Hypertension 04/25/2012  . Low back pain 04/25/2012  . Osteoporosis 04/25/2012  . Raynaud phenomenon 04/25/2012   Durwin Reges DPT Durwin Reges 07/29/2020, 2:17 PM  [facility] [address] [place], [address] Phone: [phone]   Fax:  [phone]  Name: Morgan Byrd MRN: [id number] Date of Birth: 09-13-1936

## 2020-07-31 ENCOUNTER — Ambulatory Visit: Payer: PPO | Admitting: Physical Therapy

## 2020-07-31 ENCOUNTER — Encounter: Payer: Self-pay | Admitting: Physical Therapy

## 2020-07-31 ENCOUNTER — Other Ambulatory Visit: Payer: Self-pay

## 2020-07-31 DIAGNOSIS — M5442 Lumbago with sciatica, left side: Secondary | ICD-10-CM | POA: Diagnosis not present

## 2020-07-31 DIAGNOSIS — R293 Abnormal posture: Secondary | ICD-10-CM

## 2020-07-31 DIAGNOSIS — R2689 Other abnormalities of gait and mobility: Secondary | ICD-10-CM

## 2020-07-31 DIAGNOSIS — G8929 Other chronic pain: Secondary | ICD-10-CM

## 2020-07-31 NOTE — Therapy (Signed)
[place] [facility] [address] [place], [address] Phone: [phone]   Fax:  [phone]  Physical Therapy Treatment  Patient Details  Name: Morgan Byrd MRN: [id number] Date of Birth: 03-10-1936 No data recorded  Encounter Date: 07/31/2020   PT End of Session - 07/31/20 1139    Visit Number 2    Number of Visits 17    Date for PT Re-Evaluation 09/26/20    PT Start Time 1115    PT Stop Time 1155    PT Time Calculation (min) 40 min    Activity Tolerance Patient tolerated treatment well    Behavior During Therapy [facility] for tasks assessed/performed           Past Medical History:  Diagnosis Date  . Anxiety   . Hypertension     Past Surgical History:  Procedure Laterality Date  . ABDOMINAL HYSTERECTOMY    . BLADDER SUSPENSION    . BREAST BIOPSY Right 1978   neg  . COLONOSCOPY WITH PROPOFOL N/A 12/26/2015   Procedure: COLONOSCOPY WITH PROPOFOL;  Surgeon: Manya Silvas, MD;  Location: [facility] ENDOSCOPY;  Service: Endoscopy;  Laterality: N/A;  . NOSE SURGERY    . REPLACEMENT TOTAL KNEE BILATERAL    . SHOULDER SURGERY    . VAGINAL DELIVERY     2    There were no vitals filed for this visit.   Subjective Assessment - 07/31/20 1119    Subjective Patinet reports she has been completing 56mins on her recumbant bike and her HEP, and that she has no pain today. Reports she has been sitting up to sleep over the past year and when she gets into and out of bed she will get a pain in her back on the R side    Pertinent History Patient is a 84 year old female presenting with chronic LBP, that has been worse over the past couple months with insideous onset. Reports a couple months ago she started having L sided sciatica pains, electrical/burning sensation down post LLE continuously with standing/OOB activites. Reports L sided LBP that accompanied the LLE pain that was achy/burning in nature. Reports pain when she  stands >5-70mins to wash dishes/household chores, and >32mins of walking her dog. Reports she had predisone taper beginning 07/21/20 and began gabapentin, and since has not had LLE or low back pain. Prior to this reports worst pain was 8/10 best 0/10. Lives with her husband who is disabled, that she helps bathe, dress, and transfer into cars. She reports she completes all her driving/community activities, and household chores, that she does not have help, and walks her small dog daily. Patient has 3 small steps to enter her home, no steps inside, and has handicapped accessible bathroom. Pt is trying to increased physical activity, riding recumbant bike 27mins every day, and walking dog 5-61mins/day. Pt denies N/V, B&B changes, unexplained weight fluctuation, saddle paresthesia, fever, night sweats, or unrelenting night pain at this time.    Limitations Lifting;Walking;House hold activities;Standing    How long can you sit comfortably? unlimited    How long can you stand comfortably? 49mins    How long can you walk comfortably? 61mins    Diagnostic tests 07/11/20 Diffuse degenerative disease with prominent levoscoliosis and leftward L3-4 translation. Degenerative anterolisthesis is present at L4-5. Findings have progressed from 2013.    Patient Stated Goals Decrease pain    Pain Onset More than a month ago  Ther-Ex Bridge x10; with RTB 2x 10 with min cuing for equal WB with good carry over Hooklying posterior pelvic tilt x10 with max TC/VC/demo needed to obtain position Hooklying marching from 6in step 2x 10 with max demo/TC/VC for proper technique with good carry over following Mini squat to elevated mat table 3x 10 with max cuing and demo for full stand, glute activation, and technique with decent carry over Cable pull through 5# x10; 10# 2x 10 with good carry over following demo and max cuing,  Modified thomas stretch 30sec bilat Modified childs pose seated with theraball x30sec;  L lateral  30sec                          PT Education - 07/31/20 1124    Education Details HEP review, therex technique/form    Person(s) Educated Patient    Methods Explanation;Demonstration;Verbal cues    Comprehension Verbalized understanding;Returned demonstration;Verbal cues required            PT Short Term Goals - 07/29/20 1407      PT SHORT TERM GOAL #1   Title Pt will be independent with HEP in order to improve strength and decrease back pain in order to improve pain-free function at home and work.    Baseline 07/29/20 HEP given    Time 4    Period Weeks    Status New             PT Long Term Goals - 07/29/20 1401      PT LONG TERM GOAL #1   Title Pt will demonstrate SLS of 10sec or more to demonstrate age matched norms for decreased fall risk    Baseline 07/29/20 L 2sec R: unable    Time 8    Period Weeks    Status New      PT LONG TERM GOAL #2   Title Pt will increase 6MWT by at least 79m (122ft) in order to demonstrate clinically significant improvement in cardiopulmonary and BLE endurance and community ambulation    Baseline 07/29/20 1175ft 3/10 pain in LLE    Time 8    Period Weeks    Status New      PT LONG TERM GOAL #3   Title Pt will demonstrate gait speed of at least 1.68m/s to demonstrate safe community ambulation speed    Baseline 07/29/20 0.15m/s    Time 8    Period Weeks    Status New      PT LONG TERM GOAL #4   Title Patient will increase FOTO score to 68 to demonstrate predicted increase in functional mobility to complete ADLs    Baseline 07/29/20 58    Time 8    Period Weeks    Status New      PT LONG TERM GOAL #5   Title Pt will decrease 5TSTS by at least 3 seconds in order to demonstrate clinically significant improvement in LE strength    Baseline 07/29/20 17sec    Time 8    Period Weeks    Status New                 Plan - 07/31/20 1154    Clinical Impression Statement PT initiated therex progression for  increased hip and core strengthening with good success. Patient is able to comply with cuing for proper technique of therex with multimodal cuing, good motivaiton and no increased pain. Patient demonstrates good carry over of upright  neutral posture from evaluation education. PT will continue progression as able.    Personal Factors and Comorbidities Age;Comorbidity 1;Comorbidity 2;Comorbidity 3+;Past/Current Experience;Time since onset of injury/illness/exacerbation;Fitness    Comorbidities HTN, neuropathy, chronic LBP, anxiety    Examination-Activity Limitations Squat;Carry;Lift;Stairs;Stand;Locomotion Level;Bend;Transfers;Caring for Others    Examination-Participation Restrictions Community Activity;Cleaning;Laundry;Meal Prep;Other    Stability/Clinical Decision Making Evolving/Moderate complexity    Clinical Decision Making Moderate    Rehab Potential Good    PT Frequency 2x / week    PT Duration 8 weeks    PT Treatment/Interventions ADLs/Self Care Home Management;Stair training;Therapeutic exercise;Spinal Manipulations;Joint Manipulations;Dry needling;Manual techniques;Passive range of motion;Patient/family education;Balance training;Neuromuscular re-education;Therapeutic activities;Gait training;Ultrasound;Traction;Electrical Stimulation;Moist Heat;Functional mobility training;Iontophoresis 4mg /ml Dexamethasone;Cryotherapy    PT Next Visit Plan Posture, core/hip ext strengthening    PT Home Exercise Plan bridge, STS without UE, thomas stretch    Consulted and Agree with Plan of Care Patient           Patient will benefit from skilled therapeutic intervention in order to improve the following deficits and impairments:  Abnormal gait, Decreased activity tolerance, Decreased endurance, Decreased range of motion, Decreased strength, Increased fascial restricitons, Improper body mechanics, Pain, Postural dysfunction, Impaired flexibility, Increased muscle spasms, Difficulty walking, Decreased  mobility, Decreased coordination, Decreased balance  Visit Diagnosis: Chronic left-sided low back pain with left-sided sciatica  Abnormal posture  Other abnormalities of gait and mobility     Problem List Patient Active Problem List   Diagnosis Date Noted  . Thoracic back pain 05/13/2020  . Chronic anxiety 11/14/2019  . Degenerative arthritis of knee, bilateral 11/14/2019  . Disorder of vein 11/14/2019  . Fibrocystic breast disease 11/14/2019  . Heartburn 11/14/2019  . Knee pain 11/14/2019  . Localized, primary osteoarthritis 11/14/2019  . Pain 11/14/2019  . Osteoarthritis of knee 11/14/2019  . C2 cervical fracture ([place]) 11/14/2019  . Bigeminy 11/14/2019  . Nasal fracture 11/14/2019  . History of closed fracture of nasal bones 10/11/2019  . Toe injury 07/25/2019  . Multinodular goiter 06/07/2019  . Subclinical hyperthyroidism 05/14/2019  . Bronchiectasis ([place]) 05/14/2019  . Sciatica 05/14/2019  . Thyroid nodule 04/26/2019  . Lung nodule 04/05/2019  . Bilateral lower extremity edema 02/21/2019  . Full thickness rotator cuff tear 01/02/2019  . Left shoulder pain 11/13/2018  . Femoral bruit 06/01/2018  . Allergic rhinitis 05/02/2018  . Obesity (BMI 30.0-34.9) 11/03/2017  . Anxiety 08/05/2016  . Neuropathy 02/02/2016  . Dysphagia 10/09/2012  . Hypertension 04/25/2012  . Low back pain 04/25/2012  . Osteoporosis 04/25/2012  . Raynaud phenomenon 04/25/2012   Durwin Reges DPT Durwin Reges 07/31/2020, 12:03 PM  [place] [facility] [address] [place], [address] Phone: [phone]   Fax:  [phone]  Name: Morgan Byrd MRN: [id number] Date of Birth: 01/30/1936

## 2020-08-01 ENCOUNTER — Other Ambulatory Visit: Payer: Self-pay | Admitting: Family Medicine

## 2020-08-04 ENCOUNTER — Other Ambulatory Visit: Payer: Self-pay

## 2020-08-04 ENCOUNTER — Encounter: Payer: Self-pay | Admitting: Physical Therapy

## 2020-08-04 ENCOUNTER — Ambulatory Visit: Payer: PPO

## 2020-08-04 DIAGNOSIS — R293 Abnormal posture: Secondary | ICD-10-CM

## 2020-08-04 DIAGNOSIS — M5442 Lumbago with sciatica, left side: Secondary | ICD-10-CM | POA: Diagnosis not present

## 2020-08-04 DIAGNOSIS — R2689 Other abnormalities of gait and mobility: Secondary | ICD-10-CM

## 2020-08-04 DIAGNOSIS — G8929 Other chronic pain: Secondary | ICD-10-CM

## 2020-08-04 NOTE — Therapy (Signed)
[place] [facility] [address] [place], [address] Phone: [phone]   Fax:  [phone]  Physical Therapy Treatment  Patient Details  Name: [name] MRN: [id number] Date of Birth: May 20, 1936 No data recorded  Encounter Date: 08/04/2020   PT End of Session - 08/04/20 1049    Visit Number 3    Number of Visits 17    Date for PT Re-Evaluation 09/26/20    PT Start Time 0950    PT Stop Time 1035    PT Time Calculation (min) 45 min    Activity Tolerance Patient tolerated treatment well    Behavior During Therapy [facility] for tasks assessed/performed           Past Medical History:  Diagnosis Date  . Anxiety   . Hypertension     Past Surgical History:  Procedure Laterality Date  . ABDOMINAL HYSTERECTOMY    . BLADDER SUSPENSION    . BREAST BIOPSY Right 1978   neg  . COLONOSCOPY WITH PROPOFOL N/A 12/26/2015   Procedure: COLONOSCOPY WITH PROPOFOL;  Surgeon: Manya Silvas, MD;  Location: [facility] ENDOSCOPY;  Service: Endoscopy;  Laterality: N/A;  . NOSE SURGERY    . REPLACEMENT TOTAL KNEE BILATERAL    . SHOULDER SURGERY    . VAGINAL DELIVERY     2    There were no vitals filed for this visit.   Subjective Assessment - 08/04/20 1047    Subjective Pt states she is feeling good upon arrival.  She did have an episode of 8/10 L low back pain that radiated into her leg/lateral ankle while walking her dog this past weekend.  She has been working on her HEP, riding her bike 30 min a day, and doing weight watchers.    Pertinent History Patient is a 84 year old female presenting with chronic LBP, that has been worse over the past couple months with insideous onset. Reports a couple months ago she started having L sided sciatica pains, electrical/burning sensation down post LLE continuously with standing/OOB activites. Reports L sided LBP that accompanied the LLE pain that was achy/burning in nature. Reports pain when  she stands >5-73mins to wash dishes/household chores, and >29mins of walking her dog. Reports she had predisone taper beginning 07/21/20 and began gabapentin, and since has not had LLE or low back pain. Prior to this reports worst pain was 8/10 best 0/10. Lives with her husband who is disabled, that she helps bathe, dress, and transfer into cars. She reports she completes all her driving/community activities, and household chores, that she does not have help, and walks her small dog daily. Patient has 3 small steps to enter her home, no steps inside, and has handicapped accessible bathroom. Pt is trying to increased physical activity, riding recumbant bike 84mins every day, and walking dog 5-71mins/day. Pt denies N/V, B&B changes, unexplained weight fluctuation, saddle paresthesia, fever, night sweats, or unrelenting night pain at this time.    Limitations Lifting;Walking;House hold activities;Standing    How long can you sit comfortably? unlimited    How long can you stand comfortably? 19mins    How long can you walk comfortably? 1mins    Diagnostic tests 07/11/20 Diffuse degenerative disease with prominent levoscoliosis and leftward L3-4 translation. Degenerative anterolisthesis is present at L4-5. Findings have progressed from 2013.    Patient Stated Goals Decrease pain    Pain Score 1     Pain Orientation Left    Pain  Onset More than a month ago           Ther-Ex Modified child's pose seated with theraball 30 sec x 2 Modified thomas stretch 30sec bilat SKTC x 30 se x 2 ea Hooklying posterior pelvic tilt x10 with max TC/VC/demo needed to obtain position Bridge x10; with RTB 2x 10 with min cuing for equal WB with good carry over Hooklying marching from 6in step 2x 10 with max demo/TC/VC for proper technique with good carry over following Mini squat to elevated mat table 3x 10 with max cuing and demo for full stand, glute activation, and scap squeeze at end in upright Scapular retraction x  15 Squat lift (5# med ball on 8 inch step, lift and bring to waist level) 2x 10 with good carry over following demo and max cuing            PT Education - 08/04/20 1049    Education Details ther ex form and technique    Person(s) Educated Patient    Methods Explanation;Demonstration    Comprehension Verbalized understanding;Returned demonstration            PT Short Term Goals - 07/29/20 1407      PT SHORT TERM GOAL #1   Title Pt will be independent with HEP in order to improve strength and decrease back pain in order to improve pain-free function at home and work.    Baseline 07/29/20 HEP given    Time 4    Period Weeks    Status New             PT Long Term Goals - 07/29/20 1401      PT LONG TERM GOAL #1   Title Pt will demonstrate SLS of 10sec or more to demonstrate age matched norms for decreased fall risk    Baseline 07/29/20 L 2sec R: unable    Time 8    Period Weeks    Status New      PT LONG TERM GOAL #2   Title Pt will increase 6MWT by at least 64m (149ft) in order to demonstrate clinically significant improvement in cardiopulmonary and BLE endurance and community ambulation    Baseline 07/29/20 1114ft 3/10 pain in LLE    Time 8    Period Weeks    Status New      PT LONG TERM GOAL #3   Title Pt will demonstrate gait speed of at least 1.5m/s to demonstrate safe community ambulation speed    Baseline 07/29/20 0.38m/s    Time 8    Period Weeks    Status New      PT LONG TERM GOAL #4   Title Patient will increase FOTO score to 68 to demonstrate predicted increase in functional mobility to complete ADLs    Baseline 07/29/20 58    Time 8    Period Weeks    Status New      PT LONG TERM GOAL #5   Title Pt will decrease 5TSTS by at least 3 seconds in order to demonstrate clinically significant improvement in LE strength    Baseline 07/29/20 17sec    Time 8    Period Weeks    Status New                 Plan - 08/04/20 1050    Clinical  Impression Statement Pt able to complete therex today without c/o increased low back pain or onset of L LE sx's during session.  She  does continue to require multimodal cuing (verbal, tactile, visual) for proper form during exercises.  She has difficulty with sequencing during squat/lift technique and ould benefit from more practice.    Personal Factors and Comorbidities Age;Comorbidity 1;Comorbidity 2;Comorbidity 3+;Past/Current Experience;Time since onset of injury/illness/exacerbation;Fitness    Comorbidities HTN, neuropathy, chronic LBP, anxiety    Examination-Activity Limitations Squat;Carry;Lift;Stairs;Stand;Locomotion Level;Bend;Transfers;Caring for Others    Examination-Participation Restrictions Community Activity;Cleaning;Laundry;Meal Prep;Other    Stability/Clinical Decision Making Evolving/Moderate complexity    Rehab Potential Good    PT Frequency 2x / week    PT Duration 8 weeks    PT Treatment/Interventions ADLs/Self Care Home Management;Stair training;Therapeutic exercise;Spinal Manipulations;Joint Manipulations;Dry needling;Manual techniques;Passive range of motion;Patient/family education;Balance training;Neuromuscular re-education;Therapeutic activities;Gait training;Ultrasound;Traction;Electrical Stimulation;Moist Heat;Functional mobility training;Iontophoresis 4mg /ml Dexamethasone;Cryotherapy    PT Next Visit Plan Posture, core/hip ext strengthening    PT Home Exercise Plan bridge, STS without UE, thomas stretch    Consulted and Agree with Plan of Care Patient           Patient will benefit from skilled therapeutic intervention in order to improve the following deficits and impairments:  Abnormal gait, Decreased activity tolerance, Decreased endurance, Decreased range of motion, Decreased strength, Increased fascial restricitons, Improper body mechanics, Pain, Postural dysfunction, Impaired flexibility, Increased muscle spasms, Difficulty walking, Decreased mobility, Decreased  coordination, Decreased balance  Visit Diagnosis: Chronic left-sided low back pain with left-sided sciatica  Abnormal posture  Other abnormalities of gait and mobility     Problem List Patient Active Problem List   Diagnosis Date Noted  . Thoracic back pain 05/13/2020  . Chronic anxiety 11/14/2019  . Degenerative arthritis of knee, bilateral 11/14/2019  . Disorder of vein 11/14/2019  . Fibrocystic breast disease 11/14/2019  . Heartburn 11/14/2019  . Knee pain 11/14/2019  . Localized, primary osteoarthritis 11/14/2019  . Pain 11/14/2019  . Osteoarthritis of knee 11/14/2019  . C2 cervical fracture ([place]) 11/14/2019  . Bigeminy 11/14/2019  . Nasal fracture 11/14/2019  . History of closed fracture of nasal bones 10/11/2019  . Toe injury 07/25/2019  . Multinodular goiter 06/07/2019  . Subclinical hyperthyroidism 05/14/2019  . Bronchiectasis ([place]) 05/14/2019  . Sciatica 05/14/2019  . Thyroid nodule 04/26/2019  . Lung nodule 04/05/2019  . Bilateral lower extremity edema 02/21/2019  . Full thickness rotator cuff tear 01/02/2019  . Left shoulder pain 11/13/2018  . Femoral bruit 06/01/2018  . Allergic rhinitis 05/02/2018  . Obesity (BMI 30.0-34.9) 11/03/2017  . Anxiety 08/05/2016  . Neuropathy 02/02/2016  . Dysphagia 10/09/2012  . Hypertension 04/25/2012  . Low back pain 04/25/2012  . Osteoporosis 04/25/2012  . Raynaud phenomenon 04/25/2012    Pincus Badder 08/04/2020, 10:53 AM Merdis Delay, PT, DPT Physical Therapist - [place] [facility] [address], [place], [address] Phone: [phone]   Fax:  [phone]  Name: CEOLA PARA MRN: [id number] Date of Birth: Sep 07, 1936

## 2020-08-06 ENCOUNTER — Ambulatory Visit: Payer: PPO | Admitting: Physical Therapy

## 2020-08-12 ENCOUNTER — Ambulatory Visit: Payer: PPO | Attending: Family Medicine | Admitting: Physical Therapy

## 2020-08-12 ENCOUNTER — Encounter: Payer: Self-pay | Admitting: Physical Therapy

## 2020-08-12 ENCOUNTER — Other Ambulatory Visit: Payer: Self-pay

## 2020-08-12 DIAGNOSIS — G8929 Other chronic pain: Secondary | ICD-10-CM | POA: Insufficient documentation

## 2020-08-12 DIAGNOSIS — R2689 Other abnormalities of gait and mobility: Secondary | ICD-10-CM | POA: Diagnosis not present

## 2020-08-12 DIAGNOSIS — M5442 Lumbago with sciatica, left side: Secondary | ICD-10-CM | POA: Diagnosis not present

## 2020-08-12 DIAGNOSIS — R293 Abnormal posture: Secondary | ICD-10-CM

## 2020-08-12 NOTE — Therapy (Signed)
[facility] [address], [place], [address] Phone: [phone]   Fax:  [phone]  Physical Therapy Treatment  Patient Details  Name: Morgan Byrd MRN: [id number] Date of Birth: 06-Nov-1936 No data recorded  Encounter Date: 08/12/2020   PT End of Session - 08/12/20 1306    Visit Number 4    Number of Visits 17    Date for PT Re-Evaluation 09/26/20    PT Start Time 0100    PT Stop Time 0138    PT Time Calculation (min) 38 min    Activity Tolerance Patient tolerated treatment well    Behavior During Therapy [facility] for tasks assessed/performed           Past Medical History:  Diagnosis Date  . Anxiety   . Hypertension     Past Surgical History:  Procedure Laterality Date  . ABDOMINAL HYSTERECTOMY    . BLADDER SUSPENSION    . BREAST BIOPSY Right 1978   neg  . COLONOSCOPY WITH PROPOFOL N/A 12/26/2015   Procedure: COLONOSCOPY WITH PROPOFOL;  Surgeon: Manya Silvas, MD;  Location: [facility] ENDOSCOPY;  Service: Endoscopy;  Laterality: N/A;  . NOSE SURGERY    . REPLACEMENT TOTAL KNEE BILATERAL    . SHOULDER SURGERY    . VAGINAL DELIVERY     2    There were no vitals filed for this visit.   Subjective Assessment - 08/12/20 1303    Subjective Pt reports she is feeling okay this am. Reports she has pain with heavy lifting and when she was blowing today, and if she picks up her 15# dog.    Pertinent History Patient is a 84 year old female presenting with chronic LBP, that has been worse over the past couple months with insideous onset. Reports a couple months ago she started having L sided sciatica pains, electrical/burning sensation down post LLE continuously with standing/OOB activites. Reports L sided LBP that accompanied the LLE pain that was achy/burning in nature. Reports pain when she stands >5-11mins to wash dishes/household chores, and >67mins of walking her dog. Reports she had predisone taper  beginning 07/21/20 and began gabapentin, and since has not had LLE or low back pain. Prior to this reports worst pain was 8/10 best 0/10. Lives with her husband who is disabled, that she helps bathe, dress, and transfer into cars. She reports she completes all her driving/community activities, and household chores, that she does not have help, and walks her small dog daily. Patient has 3 small steps to enter her home, no steps inside, and has handicapped accessible bathroom. Pt is trying to increased physical activity, riding recumbant bike 27mins every day, and walking dog 5-22mins/day. Pt denies N/V, B&B changes, unexplained weight fluctuation, saddle paresthesia, fever, night sweats, or unrelenting night pain at this time.    Limitations Lifting;Walking;House hold activities;Standing    How long can you sit comfortably? unlimited    How long can you stand comfortably? 27mins    How long can you walk comfortably? 14mins    Diagnostic tests 07/11/20 Diffuse degenerative disease with prominent levoscoliosis and leftward L3-4 translation. Degenerative anterolisthesis is present at L4-5. Findings have progressed from 2013.    Patient Stated Goals Decrease pain    Pain Onset More than a month ago           Ther-Ex Nustep L3 61mins; L4 58mins seat setting 8 UE 8 for gentle  15# DB lift x6 with good  carry over of demo and cuing for proper technique Squat to mat table 2x 10 with good carry over of VC for technique Hip hinge x12 with demo and heavy cuing needed for neutral spine, able to "reset" well prior to raise, difficult to maintain with lower; with 5# DB in bilat hands with difficulty utilizing scapular retraction and prevent thoracic kyphosis Bent over rows 10# x10 15# 2x 10 with cuing initially for set up posture, initially patient utilizing lumbar extension to pull up as opposed to scapular retractors, with good carry over following Prone scapular retraction + thoracic ext with chest lift 2x10 with  good carry over of demo Prone alt superman 2x 10 (minimal LUE lift) with cuing initially for technique with good carry over                          PT Education - 08/12/20 1305    Education Details therex form/technique, lifting mechanics    Person(s) Educated Patient    Methods Explanation;Demonstration;Tactile cues;Verbal cues    Comprehension Verbalized understanding;Returned demonstration;Verbal cues required;Tactile cues required            PT Short Term Goals - 07/29/20 1407      PT SHORT TERM GOAL #1   Title Pt will be independent with HEP in order to improve strength and decrease back pain in order to improve pain-free function at home and work.    Baseline 07/29/20 HEP given    Time 4    Period Weeks    Status New             PT Long Term Goals - 07/29/20 1401      PT LONG TERM GOAL #1   Title Pt will demonstrate SLS of 10sec or more to demonstrate age matched norms for decreased fall risk    Baseline 07/29/20 L 2sec R: unable    Time 8    Period Weeks    Status New      PT LONG TERM GOAL #2   Title Pt will increase 6MWT by at least 37m (121ft) in order to demonstrate clinically significant improvement in cardiopulmonary and BLE endurance and community ambulation    Baseline 07/29/20 1129ft 3/10 pain in LLE    Time 8    Period Weeks    Status New      PT LONG TERM GOAL #3   Title Pt will demonstrate gait speed of at least 1.40m/s to demonstrate safe community ambulation speed    Baseline 07/29/20 0.76m/s    Time 8    Period Weeks    Status New      PT LONG TERM GOAL #4   Title Patient will increase FOTO score to 68 to demonstrate predicted increase in functional mobility to complete ADLs    Baseline 07/29/20 58    Time 8    Period Weeks    Status New      PT LONG TERM GOAL #5   Title Pt will decrease 5TSTS by at least 3 seconds in order to demonstrate clinically significant improvement in LE strength    Baseline 07/29/20 17sec     Time 8    Period Weeks    Status New                 Plan - 08/12/20 1330    Clinical Impression Statement PT continued therex progression for increased back and hip strength, with postural focus with good  carry over. Increased demonstration this session of lumbar extensor activation compensating for lack of scapular retraction and hip ext in functional lifting; patient is able to correct this with cuing, and demonstrates good understanding of safe lifting. Patient is motivated throughout session with no increased pain throughout session. PT will continue progression as able.    Personal Factors and Comorbidities Age;Comorbidity 1;Comorbidity 2;Comorbidity 3+;Past/Current Experience;Time since onset of injury/illness/exacerbation;Fitness    Comorbidities HTN, neuropathy, chronic LBP, anxiety    Examination-Activity Limitations Squat;Carry;Lift;Stairs;Stand;Locomotion Level;Bend;Transfers;Caring for Others    Stability/Clinical Decision Making Evolving/Moderate complexity    Clinical Decision Making Moderate    Rehab Potential Good    PT Frequency 2x / week    PT Duration 8 weeks    PT Treatment/Interventions ADLs/Self Care Home Management;Stair training;Therapeutic exercise;Spinal Manipulations;Joint Manipulations;Dry needling;Manual techniques;Passive range of motion;Patient/family education;Balance training;Neuromuscular re-education;Therapeutic activities;Gait training;Ultrasound;Traction;Electrical Stimulation;Moist Heat;Functional mobility training;Iontophoresis 4mg /ml Dexamethasone;Cryotherapy    PT Next Visit Plan Posture, core/hip ext strengthening    PT Home Exercise Plan bridge, STS without UE, thomas stretch    Consulted and Agree with Plan of Care Patient           Patient will benefit from skilled therapeutic intervention in order to improve the following deficits and impairments:  Abnormal gait, Decreased activity tolerance, Decreased endurance, Decreased range of  motion, Decreased strength, Increased fascial restricitons, Improper body mechanics, Pain, Postural dysfunction, Impaired flexibility, Increased muscle spasms, Difficulty walking, Decreased mobility, Decreased coordination, Decreased balance  Visit Diagnosis: Chronic left-sided low back pain with left-sided sciatica  Abnormal posture  Other abnormalities of gait and mobility     Problem List Patient Active Problem List   Diagnosis Date Noted  . Thoracic back pain 05/13/2020  . Chronic anxiety 11/14/2019  . Degenerative arthritis of knee, bilateral 11/14/2019  . Disorder of vein 11/14/2019  . Fibrocystic breast disease 11/14/2019  . Heartburn 11/14/2019  . Knee pain 11/14/2019  . Localized, primary osteoarthritis 11/14/2019  . Pain 11/14/2019  . Osteoarthritis of knee 11/14/2019  . C2 cervical fracture ([place]) 11/14/2019  . Bigeminy 11/14/2019  . Nasal fracture 11/14/2019  . History of closed fracture of nasal bones 10/11/2019  . Toe injury 07/25/2019  . Multinodular goiter 06/07/2019  . Subclinical hyperthyroidism 05/14/2019  . Bronchiectasis ([place]) 05/14/2019  . Sciatica 05/14/2019  . Thyroid nodule 04/26/2019  . Lung nodule 04/05/2019  . Bilateral lower extremity edema 02/21/2019  . Full thickness rotator cuff tear 01/02/2019  . Left shoulder pain 11/13/2018  . Femoral bruit 06/01/2018  . Allergic rhinitis 05/02/2018  . Obesity (BMI 30.0-34.9) 11/03/2017  . Anxiety 08/05/2016  . Neuropathy 02/02/2016  . Dysphagia 10/09/2012  . Hypertension 04/25/2012  . Low back pain 04/25/2012  . Osteoporosis 04/25/2012  . Raynaud phenomenon 04/25/2012    Durwin Reges 08/12/2020, 1:40 PM  [facility] [address] [place], [address] Phone: [phone]   Fax:  [phone]  Name: NICHOLA CIESLINSKI MRN: [id number] Date of Birth: 1936/07/10

## 2020-08-14 ENCOUNTER — Encounter: Payer: PPO | Admitting: Physical Therapy

## 2020-08-15 ENCOUNTER — Ambulatory Visit: Payer: PPO | Admitting: Physical Therapy

## 2020-08-15 ENCOUNTER — Other Ambulatory Visit: Payer: Self-pay

## 2020-08-15 ENCOUNTER — Encounter: Payer: Self-pay | Admitting: Physical Therapy

## 2020-08-15 DIAGNOSIS — G8929 Other chronic pain: Secondary | ICD-10-CM

## 2020-08-15 DIAGNOSIS — M5442 Lumbago with sciatica, left side: Secondary | ICD-10-CM | POA: Diagnosis not present

## 2020-08-15 DIAGNOSIS — R293 Abnormal posture: Secondary | ICD-10-CM

## 2020-08-15 DIAGNOSIS — R2689 Other abnormalities of gait and mobility: Secondary | ICD-10-CM

## 2020-08-15 NOTE — Therapy (Signed)
[facility] [address] [place], [address] Phone: [phone]   Fax:  [phone]  Physical Therapy Treatment  Patient Details  Name: NESA DISTEL MRN: [id number] Date of Birth: 1936-10-01 No data recorded  Encounter Date: 08/15/2020   PT End of Session - 08/15/20 1125    Visit Number 5    Number of Visits 17    Date for PT Re-Evaluation 09/26/20    PT Start Time 1005    PT Stop Time 1050    PT Time Calculation (min) 45 min    Activity Tolerance Patient tolerated treatment well    Behavior During Therapy [facility] for tasks assessed/performed           Past Medical History:  Diagnosis Date  . Anxiety   . Hypertension     Past Surgical History:  Procedure Laterality Date  . ABDOMINAL HYSTERECTOMY    . BLADDER SUSPENSION    . BREAST BIOPSY Right 1978   neg  . COLONOSCOPY WITH PROPOFOL N/A 12/26/2015   Procedure: COLONOSCOPY WITH PROPOFOL;  Surgeon: Manya Silvas, MD;  Location: [facility] ENDOSCOPY;  Service: Endoscopy;  Laterality: N/A;  . NOSE SURGERY    . REPLACEMENT TOTAL KNEE BILATERAL    . SHOULDER SURGERY    . VAGINAL DELIVERY     2    There were no vitals filed for this visit.   Subjective Assessment - 08/15/20 1124    Subjective Pt reports no pain upon arrival today.  She states when she lifts something she often gets L sided sciatica in L lateral hip and lateral thigh region.    Pertinent History Patient is a 84 year old female presenting with chronic LBP, that has been worse over the past couple months with insideous onset. Reports a couple months ago she started having L sided sciatica pains, electrical/burning sensation down post LLE continuously with standing/OOB activites. Reports L sided LBP that accompanied the LLE pain that was achy/burning in nature. Reports pain when she stands >5-50mins to wash dishes/household chores, and >42mins of walking her dog. Reports she had predisone taper  beginning 07/21/20 and began gabapentin, and since has not had LLE or low back pain. Prior to this reports worst pain was 8/10 best 0/10. Lives with her husband who is disabled, that she helps bathe, dress, and transfer into cars. She reports she completes all her driving/community activities, and household chores, that she does not have help, and walks her small dog daily. Patient has 3 small steps to enter her home, no steps inside, and has handicapped accessible bathroom. Pt is trying to increased physical activity, riding recumbant bike 41mins every day, and walking dog 5-57mins/day. Pt denies N/V, B&B changes, unexplained weight fluctuation, saddle paresthesia, fever, night sweats, or unrelenting night pain at this time.    Limitations Lifting;Walking;House hold activities;Standing    How long can you sit comfortably? unlimited    How long can you stand comfortably? 44mins    How long can you walk comfortably? 80mins    Diagnostic tests 07/11/20 Diffuse degenerative disease with prominent levoscoliosis and leftward L3-4 translation. Degenerative anterolisthesis is present at L4-5. Findings have progressed from 2013.    Patient Stated Goals Decrease pain    Currently in Pain? No/denies             Treatment:  Nustep L3 warmup x 5 min; f/b hooklying LTR stretches 10x10 sec B; seated modified child pose stretches with Swiss  ball 20x; SKC stretches 3x30 sec B; Figure 4 stretch 3x30 sec on L; hip stretch in to add/IR on L in supine 3x30 sec; post pelvic tilts in hooklying 20x5sec; bridging with hip abd RTB in hooklying and ab bracing 2x10; hooklying marching with RTB around thighs and ab bracing 20x; mini squats against wall with swiss ball at L/S and ab bracing 2x10.                        PT Education - 08/15/20 1125    Education Details therex form/technique    Person(s) Educated Patient    Methods Explanation;Demonstration;Tactile cues;Verbal cues    Comprehension  Verbalized understanding;Returned demonstration;Tactile cues required;Verbal cues required            PT Short Term Goals - 07/29/20 1407      PT SHORT TERM GOAL #1   Title Pt will be independent with HEP in order to improve strength and decrease back pain in order to improve pain-free function at home and work.    Baseline 07/29/20 HEP given    Time 4    Period Weeks    Status New             PT Long Term Goals - 07/29/20 1401      PT LONG TERM GOAL #1   Title Pt will demonstrate SLS of 10sec or more to demonstrate age matched norms for decreased fall risk    Baseline 07/29/20 L 2sec R: unable    Time 8    Period Weeks    Status New      PT LONG TERM GOAL #2   Title Pt will increase 6MWT by at least 29m (12ft) in order to demonstrate clinically significant improvement in cardiopulmonary and BLE endurance and community ambulation    Baseline 07/29/20 1155ft 3/10 pain in LLE    Time 8    Period Weeks    Status New      PT LONG TERM GOAL #3   Title Pt will demonstrate gait speed of at least 1.36m/s to demonstrate safe community ambulation speed    Baseline 07/29/20 0.31m/s    Time 8    Period Weeks    Status New      PT LONG TERM GOAL #4   Title Patient will increase FOTO score to 68 to demonstrate predicted increase in functional mobility to complete ADLs    Baseline 07/29/20 58    Time 8    Period Weeks    Status New      PT LONG TERM GOAL #5   Title Pt will decrease 5TSTS by at least 3 seconds in order to demonstrate clinically significant improvement in LE strength    Baseline 07/29/20 17sec    Time 8    Period Weeks    Status New                 Plan - 08/15/20 1126    Clinical Impression Statement PT continued therex progression for increased back and hip strength.  Added fig 4 and cross over hip stretches to HEP today.  Pt was motivated throughout session.  Continue to progress lumbar/hip/core strengthening at next visit.    Personal Factors and  Comorbidities Age;Comorbidity 1;Comorbidity 2;Comorbidity 3+;Past/Current Experience;Time since onset of injury/illness/exacerbation;Fitness    Comorbidities HTN, neuropathy, chronic LBP, anxiety    Examination-Activity Limitations Squat;Carry;Lift;Stairs;Stand;Locomotion Level;Bend;Transfers;Caring for Others    Examination-Participation Restrictions Community Activity;Cleaning;Laundry;Meal Prep;Other  Stability/Clinical Decision Making Evolving/Moderate complexity    Clinical Decision Making Moderate    Rehab Potential Good    PT Frequency 2x / week    PT Duration 8 weeks    PT Treatment/Interventions ADLs/Self Care Home Management;Stair training;Therapeutic exercise;Spinal Manipulations;Joint Manipulations;Dry needling;Manual techniques;Passive range of motion;Patient/family education;Balance training;Neuromuscular re-education;Therapeutic activities;Gait training;Ultrasound;Traction;Electrical Stimulation;Moist Heat;Functional mobility training;Iontophoresis 4mg /ml Dexamethasone;Cryotherapy    PT Next Visit Plan Posture, core/hip ext strengthening    PT Home Exercise Plan bridge, STS without UE, thomas stretch, fig 4 and hip cross-over (into add/IR) stretches    Consulted and Agree with Plan of Care Patient           Patient will benefit from skilled therapeutic intervention in order to improve the following deficits and impairments:  Abnormal gait, Decreased activity tolerance, Decreased endurance, Decreased range of motion, Decreased strength, Increased fascial restricitons, Improper body mechanics, Pain, Postural dysfunction, Impaired flexibility, Increased muscle spasms, Difficulty walking, Decreased mobility, Decreased coordination, Decreased balance  Visit Diagnosis: Chronic left-sided low back pain with left-sided sciatica  Abnormal posture  Other abnormalities of gait and mobility     Problem List Patient Active Problem List   Diagnosis Date Noted  . Thoracic back pain  05/13/2020  . Chronic anxiety 11/14/2019  . Degenerative arthritis of knee, bilateral 11/14/2019  . Disorder of vein 11/14/2019  . Fibrocystic breast disease 11/14/2019  . Heartburn 11/14/2019  . Knee pain 11/14/2019  . Localized, primary osteoarthritis 11/14/2019  . Pain 11/14/2019  . Osteoarthritis of knee 11/14/2019  . C2 cervical fracture ([place]) 11/14/2019  . Bigeminy 11/14/2019  . Nasal fracture 11/14/2019  . History of closed fracture of nasal bones 10/11/2019  . Toe injury 07/25/2019  . Multinodular goiter 06/07/2019  . Subclinical hyperthyroidism 05/14/2019  . Bronchiectasis ([place]) 05/14/2019  . Sciatica 05/14/2019  . Thyroid nodule 04/26/2019  . Lung nodule 04/05/2019  . Bilateral lower extremity edema 02/21/2019  . Full thickness rotator cuff tear 01/02/2019  . Left shoulder pain 11/13/2018  . Femoral bruit 06/01/2018  . Allergic rhinitis 05/02/2018  . Obesity (BMI 30.0-34.9) 11/03/2017  . Anxiety 08/05/2016  . Neuropathy 02/02/2016  . Dysphagia 10/09/2012  . Hypertension 04/25/2012  . Low back pain 04/25/2012  . Osteoporosis 04/25/2012  . Raynaud phenomenon 04/25/2012    Brookelin Felber, MPT 08/15/2020, 11:30 AM  [facility] [address], [place], [address] Phone: [phone]   Fax:  [phone]  Name: EMILLIE CHASEN MRN: [id number] Date of Birth: September 25, 1936

## 2020-08-19 ENCOUNTER — Encounter: Payer: PPO | Admitting: Physical Therapy

## 2020-08-20 ENCOUNTER — Other Ambulatory Visit: Payer: Self-pay

## 2020-08-20 ENCOUNTER — Ambulatory Visit: Payer: PPO | Admitting: Physical Therapy

## 2020-08-20 ENCOUNTER — Encounter: Payer: Self-pay | Admitting: Physical Therapy

## 2020-08-20 DIAGNOSIS — R293 Abnormal posture: Secondary | ICD-10-CM

## 2020-08-20 DIAGNOSIS — M5442 Lumbago with sciatica, left side: Secondary | ICD-10-CM | POA: Diagnosis not present

## 2020-08-20 DIAGNOSIS — G8929 Other chronic pain: Secondary | ICD-10-CM

## 2020-08-20 DIAGNOSIS — R2689 Other abnormalities of gait and mobility: Secondary | ICD-10-CM

## 2020-08-20 NOTE — Therapy (Signed)
[facility] [address], [place], [address] Phone: [phone]   Fax:  [phone]  Physical Therapy Treatment  Patient Details  Name: Morgan Byrd MRN: [id number] Date of Birth: 23-Aug-1936 No data recorded  Encounter Date: 08/20/2020   PT End of Session - 08/20/20 1359    Visit Number 6    Number of Visits 17    Date for PT Re-Evaluation 09/26/20    PT Start Time 0145    PT Stop Time 0225    PT Time Calculation (min) 40 min    Activity Tolerance Patient tolerated treatment well    Behavior During Therapy [facility] for tasks assessed/performed           Past Medical History:  Diagnosis Date  . Anxiety   . Hypertension     Past Surgical History:  Procedure Laterality Date  . ABDOMINAL HYSTERECTOMY    . BLADDER SUSPENSION    . BREAST BIOPSY Right 1978   neg  . COLONOSCOPY WITH PROPOFOL N/A 12/26/2015   Procedure: COLONOSCOPY WITH PROPOFOL;  Surgeon: Manya Silvas, MD;  Location: [facility] ENDOSCOPY;  Service: Endoscopy;  Laterality: N/A;  . NOSE SURGERY    . REPLACEMENT TOTAL KNEE BILATERAL    . SHOULDER SURGERY    . VAGINAL DELIVERY     2    There were no vitals filed for this visit.   Subjective Assessment - 08/20/20 1348    Subjective Reports continued pain right before bed d/t being on her feet all day. She reports she slept in her bed for the first time in a few years over the past week which she is happy about. Reports no pain today.    Pertinent History Patient is a 84 year old female presenting with chronic LBP, that has been worse over the past couple months with insideous onset. Reports a couple months ago she started having L sided sciatica pains, electrical/burning sensation down post LLE continuously with standing/OOB activites. Reports L sided LBP that accompanied the LLE pain that was achy/burning in nature. Reports pain when she stands >5-87mins to wash dishes/household chores, and  >70mins of walking her dog. Reports she had predisone taper beginning 07/21/20 and began gabapentin, and since has not had LLE or low back pain. Prior to this reports worst pain was 8/10 best 0/10. Lives with her husband who is disabled, that she helps bathe, dress, and transfer into cars. She reports she completes all her driving/community activities, and household chores, that she does not have help, and walks her small dog daily. Patient has 3 small steps to enter her home, no steps inside, and has handicapped accessible bathroom. Pt is trying to increased physical activity, riding recumbant bike 38mins every day, and walking dog 5-61mins/day. Pt denies N/V, B&B changes, unexplained weight fluctuation, saddle paresthesia, fever, night sweats, or unrelenting night pain at this time.    Limitations Lifting;Walking;House hold activities;Standing    How long can you sit comfortably? unlimited    How long can you stand comfortably? 32mins    How long can you walk comfortably? 36mins    Diagnostic tests 07/11/20 Diffuse degenerative disease with prominent levoscoliosis and leftward L3-4 translation. Degenerative anterolisthesis is present at L4-5. Findings have progressed from 2013.    Patient Stated Goals Decrease pain    Pain Onset More than a month ago           Ther-Ex Nustep L3 6mins; L4 72mins seat setting  8 UE 8 for gentle strengthening Prone alt superman 3x 10 (minimal LUE lift) with cuing initially for technique with good carry over Posterior pelvic tilt/abdominal bracing in hooklying x15 3sec hold Hooklying alt TA marching x10; from 6in step 2x 10 with better maintained core contraction with modification  Squat to mat table 2x 10 with good carry over of VC for technique Straight leg deadlift/hip hinge x12; with 5# DB in bilat hands with TC to prevent excessive thoracic kyphosis with decent carry over Squat to elevated mat table x10 with BUE flex; with 5# DB extended x10 with good carry over of  demo and cuing of technique Figure 4 stretch 30sec bilat FADIR glute stretch 30sec bilat Modified seated childs pose on ball x48min                           PT Education - 08/20/20 1359    Education Details therex form/technique    Person(s) Educated Patient    Methods Explanation;Demonstration;Verbal cues    Comprehension Verbalized understanding;Returned demonstration;Verbal cues required            PT Short Term Goals - 07/29/20 1407      PT SHORT TERM GOAL #1   Title Pt will be independent with HEP in order to improve strength and decrease back pain in order to improve pain-free function at home and work.    Baseline 07/29/20 HEP given    Time 4    Period Weeks    Status New             PT Long Term Goals - 07/29/20 1401      PT LONG TERM GOAL #1   Title Pt will demonstrate SLS of 10sec or more to demonstrate age matched norms for decreased fall risk    Baseline 07/29/20 L 2sec R: unable    Time 8    Period Weeks    Status New      PT LONG TERM GOAL #2   Title Pt will increase 6MWT by at least 59m (145ft) in order to demonstrate clinically significant improvement in cardiopulmonary and BLE endurance and community ambulation    Baseline 07/29/20 1130ft 3/10 pain in LLE    Time 8    Period Weeks    Status New      PT LONG TERM GOAL #3   Title Pt will demonstrate gait speed of at least 1.39m/s to demonstrate safe community ambulation speed    Baseline 07/29/20 0.44m/s    Time 8    Period Weeks    Status New      PT LONG TERM GOAL #4   Title Patient will increase FOTO score to 68 to demonstrate predicted increase in functional mobility to complete ADLs    Baseline 07/29/20 58    Time 8    Period Weeks    Status New      PT LONG TERM GOAL #5   Title Pt will decrease 5TSTS by at least 3 seconds in order to demonstrate clinically significant improvement in LE strength    Baseline 07/29/20 17sec    Time 8    Period Weeks    Status New                  Plan - 08/20/20 1413    Clinical Impression Statement PT continued therex progression for increased hip and core strengtheing with carry over into functional movements. PT updated HEP to  reflect stretching from this session as patietn reports pain relief with this. PT will continue progression as able    Personal Factors and Comorbidities Age;Comorbidity 1;Comorbidity 2;Comorbidity 3+;Past/Current Experience;Time since onset of injury/illness/exacerbation;Fitness    Comorbidities HTN, neuropathy, chronic LBP, anxiety    Examination-Activity Limitations Squat;Carry;Lift;Stairs;Stand;Locomotion Level;Bend;Transfers;Caring for Others    Examination-Participation Restrictions Community Activity;Cleaning;Laundry;Meal Prep;Other    Stability/Clinical Decision Making Evolving/Moderate complexity    Clinical Decision Making Moderate    Rehab Potential Good    PT Frequency 2x / week    PT Duration 8 weeks    PT Treatment/Interventions ADLs/Self Care Home Management;Stair training;Therapeutic exercise;Spinal Manipulations;Joint Manipulations;Dry needling;Manual techniques;Passive range of motion;Patient/family education;Balance training;Neuromuscular re-education;Therapeutic activities;Gait training;Ultrasound;Traction;Electrical Stimulation;Moist Heat;Functional mobility training;Iontophoresis 4mg /ml Dexamethasone;Cryotherapy    PT Next Visit Plan Posture, core/hip ext strengthening    PT Home Exercise Plan bridge, STS without UE, thomas stretch, fig 4 and hip cross-over (into add/IR) stretches    Consulted and Agree with Plan of Care Patient           Patient will benefit from skilled therapeutic intervention in order to improve the following deficits and impairments:  Abnormal gait, Decreased activity tolerance, Decreased endurance, Decreased range of motion, Decreased strength, Increased fascial restricitons, Improper body mechanics, Pain, Postural dysfunction, Impaired flexibility,  Increased muscle spasms, Difficulty walking, Decreased mobility, Decreased coordination, Decreased balance  Visit Diagnosis: Chronic left-sided low back pain with left-sided sciatica  Abnormal posture  Other abnormalities of gait and mobility     Problem List Patient Active Problem List   Diagnosis Date Noted  . Thoracic back pain 05/13/2020  . Chronic anxiety 11/14/2019  . Degenerative arthritis of knee, bilateral 11/14/2019  . Disorder of vein 11/14/2019  . Fibrocystic breast disease 11/14/2019  . Heartburn 11/14/2019  . Knee pain 11/14/2019  . Localized, primary osteoarthritis 11/14/2019  . Pain 11/14/2019  . Osteoarthritis of knee 11/14/2019  . C2 cervical fracture ([place]) 11/14/2019  . Bigeminy 11/14/2019  . Nasal fracture 11/14/2019  . History of closed fracture of nasal bones 10/11/2019  . Toe injury 07/25/2019  . Multinodular goiter 06/07/2019  . Subclinical hyperthyroidism 05/14/2019  . Bronchiectasis ([place]) 05/14/2019  . Sciatica 05/14/2019  . Thyroid nodule 04/26/2019  . Lung nodule 04/05/2019  . Bilateral lower extremity edema 02/21/2019  . Full thickness rotator cuff tear 01/02/2019  . Left shoulder pain 11/13/2018  . Femoral bruit 06/01/2018  . Allergic rhinitis 05/02/2018  . Obesity (BMI 30.0-34.9) 11/03/2017  . Anxiety 08/05/2016  . Neuropathy 02/02/2016  . Dysphagia 10/09/2012  . Hypertension 04/25/2012  . Low back pain 04/25/2012  . Osteoporosis 04/25/2012  . Raynaud phenomenon 04/25/2012   Durwin Reges DPT Durwin Reges 08/20/2020, 2:27 PM  [facility] [address] [place], [address] Phone: [phone]   Fax:  [phone]  Name: Morgan Byrd MRN: [id number] Date of Birth: 1936-07-18

## 2020-08-25 ENCOUNTER — Other Ambulatory Visit: Payer: Self-pay

## 2020-08-25 ENCOUNTER — Ambulatory Visit: Payer: PPO | Admitting: Physical Therapy

## 2020-08-25 ENCOUNTER — Encounter: Payer: Self-pay | Admitting: Physical Therapy

## 2020-08-25 DIAGNOSIS — R2689 Other abnormalities of gait and mobility: Secondary | ICD-10-CM

## 2020-08-25 DIAGNOSIS — M5442 Lumbago with sciatica, left side: Secondary | ICD-10-CM | POA: Diagnosis not present

## 2020-08-25 DIAGNOSIS — G8929 Other chronic pain: Secondary | ICD-10-CM

## 2020-08-25 DIAGNOSIS — R293 Abnormal posture: Secondary | ICD-10-CM

## 2020-08-25 NOTE — Therapy (Signed)
[facility] [address], [place], [address] Phone: [phone]   Fax:  [phone]  Physical Therapy Treatment  Patient Details  Name: Morgan Byrd MRN: [id number] Date of Birth: 1936-02-13 No data recorded  Encounter Date: 08/25/2020   PT End of Session - 08/25/20 1442    Visit Number 7    Number of Visits 17    Date for PT Re-Evaluation 09/26/20    PT Start Time 0230    PT Stop Time 0315    PT Time Calculation (min) 45 min    Activity Tolerance Patient tolerated treatment well    Behavior During Therapy [facility] for tasks assessed/performed           Past Medical History:  Diagnosis Date  . Anxiety   . Hypertension     Past Surgical History:  Procedure Laterality Date  . ABDOMINAL HYSTERECTOMY    . BLADDER SUSPENSION    . BREAST BIOPSY Right 1978   neg  . COLONOSCOPY WITH PROPOFOL N/A 12/26/2015   Procedure: COLONOSCOPY WITH PROPOFOL;  Surgeon: Manya Silvas, MD;  Location: [facility] ENDOSCOPY;  Service: Endoscopy;  Laterality: N/A;  . NOSE SURGERY    . REPLACEMENT TOTAL KNEE BILATERAL    . SHOULDER SURGERY    . VAGINAL DELIVERY     2    There were no vitals filed for this visit.   Subjective Assessment - 08/25/20 1435    Subjective Patient reports at the end of the night she has pain that she describes as a "burning hurt" that is in the lateral buttock down the lateral leg in the afternoon when she is finished with her day. Reports this pain is not coming on currently.    Pertinent History Patient is a 84 year old female presenting with chronic LBP, that has been worse over the past couple months with insideous onset. Reports a couple months ago she started having L sided sciatica pains, electrical/burning sensation down post LLE continuously with standing/OOB activites. Reports L sided LBP that accompanied the LLE pain that was achy/burning in nature. Reports pain when she stands >5-66mins to  wash dishes/household chores, and >58mins of walking her dog. Reports she had predisone taper beginning 07/21/20 and began gabapentin, and since has not had LLE or low back pain. Prior to this reports worst pain was 8/10 best 0/10. Lives with her husband who is disabled, that she helps bathe, dress, and transfer into cars. She reports she completes all her driving/community activities, and household chores, that she does not have help, and walks her small dog daily. Patient has 3 small steps to enter her home, no steps inside, and has handicapped accessible bathroom. Pt is trying to increased physical activity, riding recumbant bike 58mins every day, and walking dog 5-65mins/day. Pt denies N/V, B&B changes, unexplained weight fluctuation, saddle paresthesia, fever, night sweats, or unrelenting night pain at this time.    Limitations Lifting;Walking;House hold activities;Standing    How long can you sit comfortably? unlimited    How long can you stand comfortably? 22mins    How long can you walk comfortably? 17mins    Diagnostic tests 07/11/20 Diffuse degenerative disease with prominent levoscoliosis and leftward L3-4 translation. Degenerative anterolisthesis is present at L4-5. Findings have progressed from 2013.    Patient Stated Goals Decrease pain    Pain Onset More than a month ago  Ther-Ex Nustep L4 47mins seat setting 8 UE 8 for gentle strengthening PT educated patient on typical sciatica presentation and reports this is not what she feels, able to demonstrate sciatic nerve glide x20 with education to complete this evening. Patient is unable to report aggravating factors, says that when she lifts things or walks for any period of time her pain comes on; but when reminded of cable pull throughs and 6MWT in PT reports this might be more time dependent. PT reviewed safe/proper lifting mechanics with understanding.   Squat with 19# box 3x 10/17/09 with max multimodal cuing for technique,  excessive thoracic kyphosis Straight leg deadlift/hip hinge with 7# DB in bilat hands with TC to prevent excessive thoracic kyphosis with decent carry over Prone alt supermans 3x 12 with difficulty with lift, minimal lift available  Prone trunk ext with bilat shoulder ext 3x 12 with eccentric control Modified seated childs pose on ball x88min                          PT Education - 08/25/20 1441    Education Details therex form/technique    Person(s) Educated Patient    Methods Explanation;Demonstration;Verbal cues    Comprehension Verbalized understanding;Returned demonstration;Verbal cues required            PT Short Term Goals - 07/29/20 1407      PT SHORT TERM GOAL #1   Title Pt will be independent with HEP in order to improve strength and decrease back pain in order to improve pain-free function at home and work.    Baseline 07/29/20 HEP given    Time 4    Period Weeks    Status New             PT Long Term Goals - 07/29/20 1401      PT LONG TERM GOAL #1   Title Pt will demonstrate SLS of 10sec or more to demonstrate age matched norms for decreased fall risk    Baseline 07/29/20 L 2sec R: unable    Time 8    Period Weeks    Status New      PT LONG TERM GOAL #2   Title Pt will increase 6MWT by at least 1m (131ft) in order to demonstrate clinically significant improvement in cardiopulmonary and BLE endurance and community ambulation    Baseline 07/29/20 1119ft 3/10 pain in LLE    Time 8    Period Weeks    Status New      PT LONG TERM GOAL #3   Title Pt will demonstrate gait speed of at least 1.13m/s to demonstrate safe community ambulation speed    Baseline 07/29/20 0.84m/s    Time 8    Period Weeks    Status New      PT LONG TERM GOAL #4   Title Patient will increase FOTO score to 68 to demonstrate predicted increase in functional mobility to complete ADLs    Baseline 07/29/20 58    Time 8    Period Weeks    Status New      PT LONG TERM  GOAL #5   Title Pt will decrease 5TSTS by at least 3 seconds in order to demonstrate clinically significant improvement in LE strength    Baseline 07/29/20 17sec    Time 8    Period Weeks    Status New  Plan - 08/25/20 1456    Clinical Impression Statement PT continued therex progression for improved lifting mechanics and increased spine extensor and hip strength with success. PT attempted to better unserstand evening pain that is paitient's cheif c/o function and pain at this time. Could possibly be sciatica, but unclear by current discription. PT educated patient on sciatic nerve glide to attempt to resolve symptoms and lifting mechanics to prevent pain with good understanding. Patient is motivated throughout session without increased pain. PT will continue progression as able.    Personal Factors and Comorbidities Age;Comorbidity 1;Comorbidity 2;Comorbidity 3+;Past/Current Experience;Time since onset of injury/illness/exacerbation;Fitness    Comorbidities HTN, neuropathy, chronic LBP, anxiety    Examination-Activity Limitations Squat;Carry;Lift;Stairs;Stand;Locomotion Level;Bend;Transfers;Caring for Others    Examination-Participation Restrictions Community Activity;Cleaning;Laundry;Meal Prep;Other    Stability/Clinical Decision Making Evolving/Moderate complexity    Clinical Decision Making Moderate    Rehab Potential Good    PT Frequency 2x / week    PT Duration 8 weeks    PT Treatment/Interventions ADLs/Self Care Home Management;Stair training;Therapeutic exercise;Spinal Manipulations;Joint Manipulations;Dry needling;Manual techniques;Passive range of motion;Patient/family education;Balance training;Neuromuscular re-education;Therapeutic activities;Gait training;Ultrasound;Traction;Electrical Stimulation;Moist Heat;Functional mobility training;Iontophoresis 4mg /ml Dexamethasone;Cryotherapy    PT Next Visit Plan Posture, core/hip ext strengthening    PT Home Exercise  Plan bridge, STS without UE, thomas stretch, fig 4 and hip cross-over (into add/IR) stretches    Consulted and Agree with Plan of Care Patient           Patient will benefit from skilled therapeutic intervention in order to improve the following deficits and impairments:  Abnormal gait, Decreased activity tolerance, Decreased endurance, Decreased range of motion, Decreased strength, Increased fascial restricitons, Improper body mechanics, Pain, Postural dysfunction, Impaired flexibility, Increased muscle spasms, Difficulty walking, Decreased mobility, Decreased coordination, Decreased balance  Visit Diagnosis: Chronic left-sided low back pain with left-sided sciatica  Abnormal posture  Other abnormalities of gait and mobility     Problem List Patient Active Problem List   Diagnosis Date Noted  . Thoracic back pain 05/13/2020  . Chronic anxiety 11/14/2019  . Degenerative arthritis of knee, bilateral 11/14/2019  . Disorder of vein 11/14/2019  . Fibrocystic breast disease 11/14/2019  . Heartburn 11/14/2019  . Knee pain 11/14/2019  . Localized, primary osteoarthritis 11/14/2019  . Pain 11/14/2019  . Osteoarthritis of knee 11/14/2019  . C2 cervical fracture ([place]) 11/14/2019  . Bigeminy 11/14/2019  . Nasal fracture 11/14/2019  . History of closed fracture of nasal bones 10/11/2019  . Toe injury 07/25/2019  . Multinodular goiter 06/07/2019  . Subclinical hyperthyroidism 05/14/2019  . Bronchiectasis ([place]) 05/14/2019  . Sciatica 05/14/2019  . Thyroid nodule 04/26/2019  . Lung nodule 04/05/2019  . Bilateral lower extremity edema 02/21/2019  . Full thickness rotator cuff tear 01/02/2019  . Left shoulder pain 11/13/2018  . Femoral bruit 06/01/2018  . Allergic rhinitis 05/02/2018  . Obesity (BMI 30.0-34.9) 11/03/2017  . Anxiety 08/05/2016  . Neuropathy 02/02/2016  . Dysphagia 10/09/2012  . Hypertension 04/25/2012  . Low back pain 04/25/2012  . Osteoporosis 04/25/2012  .  Raynaud phenomenon 04/25/2012   Durwin Reges DPT Durwin Reges 08/25/2020, 3:16 PM  [facility] [address] [place], [address] Phone: [phone]   Fax:  [phone]  Name: NIMRIT KEHRES MRN: [id number] Date of Birth: Mar 25, 1936

## 2020-08-28 ENCOUNTER — Encounter: Payer: Self-pay | Admitting: Physical Therapy

## 2020-08-28 ENCOUNTER — Other Ambulatory Visit: Payer: Self-pay

## 2020-08-28 ENCOUNTER — Ambulatory Visit: Payer: PPO | Admitting: Physical Therapy

## 2020-08-28 DIAGNOSIS — R2689 Other abnormalities of gait and mobility: Secondary | ICD-10-CM

## 2020-08-28 DIAGNOSIS — M5442 Lumbago with sciatica, left side: Secondary | ICD-10-CM

## 2020-08-28 DIAGNOSIS — G8929 Other chronic pain: Secondary | ICD-10-CM

## 2020-08-28 DIAGNOSIS — R293 Abnormal posture: Secondary | ICD-10-CM

## 2020-08-28 NOTE — Therapy (Signed)
[facility] [address], [place], [address] Phone: [phone]   Fax:  [phone]  Physical Therapy Treatment  Patient Details  Name: Morgan Byrd MRN: [id number] Date of Birth: 06/05/1936 No data recorded  Encounter Date: 08/28/2020   PT End of Session - 08/28/20 1309    Visit Number 8    Number of Visits 17    Date for PT Re-Evaluation 09/26/20    PT Start Time 0103    PT Stop Time 0143    PT Time Calculation (min) 40 min    Activity Tolerance Patient tolerated treatment well    Behavior During Therapy [facility] for tasks assessed/performed           Past Medical History:  Diagnosis Date  . Anxiety   . Hypertension     Past Surgical History:  Procedure Laterality Date  . ABDOMINAL HYSTERECTOMY    . BLADDER SUSPENSION    . BREAST BIOPSY Right 1978   neg  . COLONOSCOPY WITH PROPOFOL N/A 12/26/2015   Procedure: COLONOSCOPY WITH PROPOFOL;  Surgeon: Manya Silvas, MD;  Location: [facility] ENDOSCOPY;  Service: Endoscopy;  Laterality: N/A;  . NOSE SURGERY    . REPLACEMENT TOTAL KNEE BILATERAL    . SHOULDER SURGERY    . VAGINAL DELIVERY     2    There were no vitals filed for this visit.   Subjective Assessment - 08/28/20 1306    Subjective Patient reports that she has continued to have pain in the evening, thinks that the sciatic nerve glides might be helping her. Reports pain always starts right after lunch, but denies having pain currently.    Pertinent History Patient is a 84 year old female presenting with chronic LBP, that has been worse over the past couple months with insideous onset. Reports a couple months ago she started having L sided sciatica pains, electrical/burning sensation down post LLE continuously with standing/OOB activites. Reports L sided LBP that accompanied the LLE pain that was achy/burning in nature. Reports pain when she stands >5-32mins to wash dishes/household chores, and  >23mins of walking her dog. Reports she had predisone taper beginning 07/21/20 and began gabapentin, and since has not had LLE or low back pain. Prior to this reports worst pain was 8/10 best 0/10. Lives with her husband who is disabled, that she helps bathe, dress, and transfer into cars. She reports she completes all her driving/community activities, and household chores, that she does not have help, and walks her small dog daily. Patient has 3 small steps to enter her home, no steps inside, and has handicapped accessible bathroom. Pt is trying to increased physical activity, riding recumbant bike 88mins every day, and walking dog 5-52mins/day. Pt denies N/V, B&B changes, unexplained weight fluctuation, saddle paresthesia, fever, night sweats, or unrelenting night pain at this time.    Limitations Lifting;Walking;House hold activities;Standing    How long can you sit comfortably? unlimited    How long can you stand comfortably? 86mins    How long can you walk comfortably? 76mins    Diagnostic tests 07/11/20 Diffuse degenerative disease with prominent levoscoliosis and leftward L3-4 translation. Degenerative anterolisthesis is present at L4-5. Findings have progressed from 2013.    Patient Stated Goals Decrease pain    Pain Onset More than a month ago               Ther-Ex Nustep L4 90mins seat setting 8 UE 8 for gentlestrengthening  Squat with 19# box 3x 10 with consistent cuing for upright posture, difficulty prevent thoracic kyphosis Straight leg deadlift/hip hinge 2x 12 with dowel across shoulders for increased carry over of neutral spine with much better compliance than previous session; with 5# DB bilat x10 with excellent carry over of technique Prone alt supermans 3x 12 with difficulty with lift, minimal lift available; prone prop 31min between each set  Prone trunk ext with bilat shoulder ext 2x 12 with eccentric control Backward walking step to 2x 57ft with CGA for safety with good carry  over of increased hip ext Standing hip ext 2x 10 bilat on wall maintaining ASIS contact to wall, good carry over of cuing for glute contraction with hip ext Modified seated childs pose on ball x38min Incline walking 10% grade 1.16mph 79mins; 3mph 32min with cuing for increased step length and upright posture with good carry over       PT Education - 08/28/20 1309    Education Details therex form/technique    Person(s) Educated Patient    Methods Explanation;Demonstration;Verbal cues    Comprehension Verbalized understanding;Returned demonstration;Verbal cues required            PT Short Term Goals - 07/29/20 1407      PT SHORT TERM GOAL #1   Title Pt will be independent with HEP in order to improve strength and decrease back pain in order to improve pain-free function at home and work.    Baseline 07/29/20 HEP given    Time 4    Period Weeks    Status New             PT Long Term Goals - 07/29/20 1401      PT LONG TERM GOAL #1   Title Pt will demonstrate SLS of 10sec or more to demonstrate age matched norms for decreased fall risk    Baseline 07/29/20 L 2sec R: unable    Time 8    Period Weeks    Status New      PT LONG TERM GOAL #2   Title Pt will increase 6MWT by at least 53m (153ft) in order to demonstrate clinically significant improvement in cardiopulmonary and BLE endurance and community ambulation    Baseline 07/29/20 1154ft 3/10 pain in LLE    Time 8    Period Weeks    Status New      PT LONG TERM GOAL #3   Title Pt will demonstrate gait speed of at least 1.12m/s to demonstrate safe community ambulation speed    Baseline 07/29/20 0.59m/s    Time 8    Period Weeks    Status New      PT LONG TERM GOAL #4   Title Patient will increase FOTO score to 68 to demonstrate predicted increase in functional mobility to complete ADLs    Baseline 07/29/20 58    Time 8    Period Weeks    Status New      PT LONG TERM GOAL #5   Title Pt will decrease 5TSTS by at least 3  seconds in order to demonstrate clinically significant improvement in LE strength    Baseline 07/29/20 17sec    Time 8    Period Weeks    Status New                 Plan - 08/28/20 1317    Clinical Impression Statement PT continued therex for extensor strength and functional movements with success. Patient is able to  comply with all cuing for proper techniques of therex and improve upright posture throughout session. PT educated patietn on increased extension to allow better spine and nerve moility with verbalized understanding. PT will continue progression as able.    Personal Factors and Comorbidities Age;Comorbidity 1;Comorbidity 2;Comorbidity 3+;Past/Current Experience;Time since onset of injury/illness/exacerbation;Fitness    Comorbidities HTN, neuropathy, chronic LBP, anxiety    Examination-Activity Limitations Squat;Carry;Lift;Stairs;Stand;Locomotion Level;Bend;Transfers;Caring for Others    Examination-Participation Restrictions Community Activity;Cleaning;Laundry;Meal Prep;Other    Stability/Clinical Decision Making Evolving/Moderate complexity    Clinical Decision Making Moderate    Rehab Potential Good    PT Frequency 2x / week    PT Duration 8 weeks    PT Treatment/Interventions ADLs/Self Care Home Management;Stair training;Therapeutic exercise;Spinal Manipulations;Joint Manipulations;Dry needling;Manual techniques;Passive range of motion;Patient/family education;Balance training;Neuromuscular re-education;Therapeutic activities;Gait training;Ultrasound;Traction;Electrical Stimulation;Moist Heat;Functional mobility training;Iontophoresis 4mg /ml Dexamethasone;Cryotherapy    PT Next Visit Plan Posture, core/hip ext strengthening    PT Home Exercise Plan bridge, STS without UE, thomas stretch, fig 4 and hip cross-over (into add/IR) stretches    Consulted and Agree with Plan of Care Patient           Patient will benefit from skilled therapeutic intervention in order to  improve the following deficits and impairments:  Abnormal gait, Decreased activity tolerance, Decreased endurance, Decreased range of motion, Decreased strength, Increased fascial restricitons, Improper body mechanics, Pain, Postural dysfunction, Impaired flexibility, Increased muscle spasms, Difficulty walking, Decreased mobility, Decreased coordination, Decreased balance  Visit Diagnosis: Chronic left-sided low back pain with left-sided sciatica  Abnormal posture  Other abnormalities of gait and mobility     Problem List Patient Active Problem List   Diagnosis Date Noted  . Thoracic back pain 05/13/2020  . Chronic anxiety 11/14/2019  . Degenerative arthritis of knee, bilateral 11/14/2019  . Disorder of vein 11/14/2019  . Fibrocystic breast disease 11/14/2019  . Heartburn 11/14/2019  . Knee pain 11/14/2019  . Localized, primary osteoarthritis 11/14/2019  . Pain 11/14/2019  . Osteoarthritis of knee 11/14/2019  . C2 cervical fracture ([place]) 11/14/2019  . Bigeminy 11/14/2019  . Nasal fracture 11/14/2019  . History of closed fracture of nasal bones 10/11/2019  . Toe injury 07/25/2019  . Multinodular goiter 06/07/2019  . Subclinical hyperthyroidism 05/14/2019  . Bronchiectasis ([place]) 05/14/2019  . Sciatica 05/14/2019  . Thyroid nodule 04/26/2019  . Lung nodule 04/05/2019  . Bilateral lower extremity edema 02/21/2019  . Full thickness rotator cuff tear 01/02/2019  . Left shoulder pain 11/13/2018  . Femoral bruit 06/01/2018  . Allergic rhinitis 05/02/2018  . Obesity (BMI 30.0-34.9) 11/03/2017  . Anxiety 08/05/2016  . Neuropathy 02/02/2016  . Dysphagia 10/09/2012  . Hypertension 04/25/2012  . Low back pain 04/25/2012  . Osteoporosis 04/25/2012  . Raynaud phenomenon 04/25/2012   Durwin Reges DPT Durwin Reges 08/28/2020, 1:44 PM  [facility] [address] [place], [address] Phone: [phone]    Fax:  [phone]  Name: KEIA RASK MRN: [id number] Date of Birth: 1936/05/26

## 2020-09-01 ENCOUNTER — Ambulatory Visit: Payer: PPO | Admitting: Physical Therapy

## 2020-09-01 ENCOUNTER — Other Ambulatory Visit: Payer: Self-pay

## 2020-09-01 ENCOUNTER — Encounter: Payer: Self-pay | Admitting: Physical Therapy

## 2020-09-01 DIAGNOSIS — R293 Abnormal posture: Secondary | ICD-10-CM

## 2020-09-01 DIAGNOSIS — G8929 Other chronic pain: Secondary | ICD-10-CM

## 2020-09-01 DIAGNOSIS — M5442 Lumbago with sciatica, left side: Secondary | ICD-10-CM | POA: Diagnosis not present

## 2020-09-01 DIAGNOSIS — R2689 Other abnormalities of gait and mobility: Secondary | ICD-10-CM

## 2020-09-01 NOTE — Therapy (Signed)
[facility] [address] [place], [address] Phone: [phone]   Fax:  [phone]  Physical Therapy Treatment  Patient Details  Name: Morgan Byrd MRN: [id number] Date of Birth: 09-10-1936 No data recorded  Encounter Date: 09/01/2020   PT End of Session - 09/01/20 0950    Visit Number 9    Number of Visits 17    Date for PT Re-Evaluation 09/26/20    PT Start Time 0945    PT Stop Time 1025    PT Time Calculation (min) 40 min    Activity Tolerance Patient tolerated treatment well    Behavior During Therapy [facility] for tasks assessed/performed           Past Medical History:  Diagnosis Date  . Anxiety   . Hypertension     Past Surgical History:  Procedure Laterality Date  . ABDOMINAL HYSTERECTOMY    . BLADDER SUSPENSION    . BREAST BIOPSY Right 1978   neg  . COLONOSCOPY WITH PROPOFOL N/A 12/26/2015   Procedure: COLONOSCOPY WITH PROPOFOL;  Surgeon: Manya Silvas, MD;  Location: [facility] ENDOSCOPY;  Service: Endoscopy;  Laterality: N/A;  . NOSE SURGERY    . REPLACEMENT TOTAL KNEE BILATERAL    . SHOULDER SURGERY    . VAGINAL DELIVERY     2    There were no vitals filed for this visit.   Subjective Assessment - 09/01/20 0947    Subjective Patient continues to report she is having buttock pain every evening 9/10, amd reports it is walking dependent, and cannot give a time to when it comes on. Reports nerve glide helps when pain comes on, has not been completing HEP.    Pertinent History Patient is a 84 year old female presenting with chronic LBP, that has been worse over the past couple months with insideous onset. Reports a couple months ago she started having L sided sciatica pains, electrical/burning sensation down post LLE continuously with standing/OOB activites. Reports L sided LBP that accompanied the LLE pain that was achy/burning in nature. Reports pain when she stands >5-86mins to wash  dishes/household chores, and >83mins of walking her dog. Reports she had predisone taper beginning 07/21/20 and began gabapentin, and since has not had LLE or low back pain. Prior to this reports worst pain was 8/10 best 0/10. Lives with her husband who is disabled, that she helps bathe, dress, and transfer into cars. She reports she completes all her driving/community activities, and household chores, that she does not have help, and walks her small dog daily. Patient has 3 small steps to enter her home, no steps inside, and has handicapped accessible bathroom. Pt is trying to increased physical activity, riding recumbant bike 76mins every day, and walking dog 5-7mins/day. Pt denies N/V, B&B changes, unexplained weight fluctuation, saddle paresthesia, fever, night sweats, or unrelenting night pain at this time.    Limitations Lifting;Walking;House hold activities;Standing    How long can you sit comfortably? unlimited    How long can you stand comfortably? 82mins    How long can you walk comfortably? 65mins    Diagnostic tests 07/11/20 Diffuse degenerative disease with prominent levoscoliosis and leftward L3-4 translation. Degenerative anterolisthesis is present at L4-5. Findings have progressed from 2013.    Patient Stated Goals Decrease pain    Pain Onset More than a month ago           Ther-Ex Nustep L4 9minsseat setting 8 UE 8  for gentlestrengthening Prone press x10 reports she feels a "pull" with this Seated lumbar ext over towel roll Education on lumbar ext for centralization and reduction of "sciatic symptoms" in prone press, sitting and standing ext to complete every hour  Prone alt supermans 3x 12 with difficulty with lift, minimal lift available; prone prop 110min between each set  Prone trunk ext with bilat shoulder ext x12 with eccentric control; with chest off mat table 2x 12 with tactile stabilization at post thighs with success Straight leg deadlift/hip hinge x12 withdowel across  shoulders for increased carry over of neutral spine with good compliance; cable pull through with 10# 2x 10 with better carry over with resistance than previous sessions Backward walking step to 2x 86ft with CGA for safety with good carry over of increased hip ext Standing hip ext 2x 15bilat on wall maintaining ASIS contact to wall, good carry over of cuing for glute contraction with hip ext Modified seated childs pose on ball x52min          PT Education - 09/01/20 0949    Education Details therex form/technique    Person(s) Educated Patient    Methods Explanation;Demonstration;Verbal cues    Comprehension Verbalized understanding;Returned demonstration;Verbal cues required            PT Short Term Goals - 07/29/20 1407      PT SHORT TERM GOAL #1   Title Pt will be independent with HEP in order to improve strength and decrease back pain in order to improve pain-free function at home and work.    Baseline 07/29/20 HEP given    Time 4    Period Weeks    Status New             PT Long Term Goals - 07/29/20 1401      PT LONG TERM GOAL #1   Title Pt will demonstrate SLS of 10sec or more to demonstrate age matched norms for decreased fall risk    Baseline 07/29/20 L 2sec R: unable    Time 8    Period Weeks    Status New      PT LONG TERM GOAL #2   Title Pt will increase 6MWT by at least 9m (171ft) in order to demonstrate clinically significant improvement in cardiopulmonary and BLE endurance and community ambulation    Baseline 07/29/20 1140ft 3/10 pain in LLE    Time 8    Period Weeks    Status New      PT LONG TERM GOAL #3   Title Pt will demonstrate gait speed of at least 1.89m/s to demonstrate safe community ambulation speed    Baseline 07/29/20 0.56m/s    Time 8    Period Weeks    Status New      PT LONG TERM GOAL #4   Title Patient will increase FOTO score to 68 to demonstrate predicted increase in functional mobility to complete ADLs    Baseline 07/29/20 58     Time 8    Period Weeks    Status New      PT LONG TERM GOAL #5   Title Pt will decrease 5TSTS by at least 3 seconds in order to demonstrate clinically significant improvement in LE strength    Baseline 07/29/20 17sec    Time [place] - 09/01/20 6378  Clinical Impression Statement PT continued therex for extensor strength and mobility with success. Patient is able to demonstrate good carry over technique following multimodal cuing and verbalizes understanding of HEP recommendation for lumbar ext every hour. PT will reassess next session, continue progression as able.    Personal Factors and Comorbidities Age;Comorbidity 1;Comorbidity 2;Comorbidity 3+;Past/Current Experience;Time since onset of injury/illness/exacerbation;Fitness    Comorbidities HTN, neuropathy, chronic LBP, anxiety    Examination-Activity Limitations Squat;Carry;Lift;Stairs;Stand;Locomotion Level;Bend;Transfers;Caring for Others    Examination-Participation Restrictions Community Activity;Cleaning;Laundry;Meal Prep;Other    Stability/Clinical Decision Making Evolving/Moderate complexity    Clinical Decision Making Moderate    Rehab Potential Good    PT Frequency 2x / week    PT Duration 8 weeks    PT Treatment/Interventions ADLs/Self Care Home Management;Stair training;Therapeutic exercise;Spinal Manipulations;Joint Manipulations;Dry needling;Manual techniques;Passive range of motion;Patient/family education;Balance training;Neuromuscular re-education;Therapeutic activities;Gait training;Ultrasound;Traction;Electrical Stimulation;Moist Heat;Functional mobility training;Iontophoresis 4mg /ml Dexamethasone;Cryotherapy    PT Next Visit Plan Posture, core/hip ext strengthening    PT Home Exercise Plan bridge, STS without UE, thomas stretch, fig 4 and hip cross-over (into add/IR) stretches    Consulted and Agree with Plan of Care Patient           Patient will benefit  from skilled therapeutic intervention in order to improve the following deficits and impairments:  Abnormal gait, Decreased activity tolerance, Decreased endurance, Decreased range of motion, Decreased strength, Increased fascial restricitons, Improper body mechanics, Pain, Postural dysfunction, Impaired flexibility, Increased muscle spasms, Difficulty walking, Decreased mobility, Decreased coordination, Decreased balance  Visit Diagnosis: Chronic left-sided low back pain with left-sided sciatica  Abnormal posture  Other abnormalities of gait and mobility     Problem List Patient Active Problem List   Diagnosis Date Noted  . Thoracic back pain 05/13/2020  . Chronic anxiety 11/14/2019  . Degenerative arthritis of knee, bilateral 11/14/2019  . Disorder of vein 11/14/2019  . Fibrocystic breast disease 11/14/2019  . Heartburn 11/14/2019  . Knee pain 11/14/2019  . Localized, primary osteoarthritis 11/14/2019  . Pain 11/14/2019  . Osteoarthritis of knee 11/14/2019  . C2 cervical fracture ([place]) 11/14/2019  . Bigeminy 11/14/2019  . Nasal fracture 11/14/2019  . History of closed fracture of nasal bones 10/11/2019  . Toe injury 07/25/2019  . Multinodular goiter 06/07/2019  . Subclinical hyperthyroidism 05/14/2019  . Bronchiectasis ([place]) 05/14/2019  . Sciatica 05/14/2019  . Thyroid nodule 04/26/2019  . Lung nodule 04/05/2019  . Bilateral lower extremity edema 02/21/2019  . Full thickness rotator cuff tear 01/02/2019  . Left shoulder pain 11/13/2018  . Femoral bruit 06/01/2018  . Allergic rhinitis 05/02/2018  . Obesity (BMI 30.0-34.9) 11/03/2017  . Anxiety 08/05/2016  . Neuropathy 02/02/2016  . Dysphagia 10/09/2012  . Hypertension 04/25/2012  . Low back pain 04/25/2012  . Osteoporosis 04/25/2012  . Raynaud phenomenon 04/25/2012   Durwin Reges DPT Durwin Reges 09/01/2020, 10:21 AM  [facility] [address], [place], [address] Phone: [phone]   Fax:  [phone]  Name: ALONNAH LAMPKINS MRN: [id number] Date of Birth: 09-30-1936

## 2020-09-04 ENCOUNTER — Encounter: Payer: Self-pay | Admitting: Physical Therapy

## 2020-09-04 ENCOUNTER — Other Ambulatory Visit: Payer: Self-pay

## 2020-09-04 ENCOUNTER — Ambulatory Visit: Payer: PPO | Admitting: Physical Therapy

## 2020-09-04 DIAGNOSIS — G8929 Other chronic pain: Secondary | ICD-10-CM

## 2020-09-04 DIAGNOSIS — R293 Abnormal posture: Secondary | ICD-10-CM

## 2020-09-04 DIAGNOSIS — M5442 Lumbago with sciatica, left side: Secondary | ICD-10-CM

## 2020-09-04 DIAGNOSIS — R2689 Other abnormalities of gait and mobility: Secondary | ICD-10-CM

## 2020-09-04 NOTE — Therapy (Signed)
[facility] [address] [place], [address] Phone: [phone]   Fax:  [phone]  Physical Therapy Treatment/DC Summary 07/29/20 - 09/04/20  Patient Details  Name: Morgan Byrd MRN: [id number] Date of Birth: 07-02-1936 No data recorded  Encounter Date: 09/04/2020   PT End of Session - 09/04/20 1308    Visit Number 10    Number of Visits 17    Date for PT Re-Evaluation 09/26/20    PT Start Time 0104    PT Stop Time 0145    PT Time Calculation (min) 41 min    Activity Tolerance Patient tolerated treatment well    Behavior During Therapy [facility] for tasks assessed/performed           Past Medical History:  Diagnosis Date  . Anxiety   . Hypertension     Past Surgical History:  Procedure Laterality Date  . ABDOMINAL HYSTERECTOMY    . BLADDER SUSPENSION    . BREAST BIOPSY Right 1978   neg  . COLONOSCOPY WITH PROPOFOL N/A 12/26/2015   Procedure: COLONOSCOPY WITH PROPOFOL;  Surgeon: Manya Silvas, MD;  [facility] END[facility]COPY;  Service: Endoscopy;  Laterality: N/A;  . NOSE SURGERY    . REPLACEMENT TOTAL KNEE BILATERAL    . SHOULDER SURGERY    . VAGINAL DELIVERY     2    There were no vitals filed for this visit.   Subjective Assessment - 09/04/20 1306    Subjective Patient reports the repeated ext have helped her pain, reporting minimal pain last night. No pain today.    Pertinent History Patient is a 84 year old female presenting with chronic LBP, that has been worse over the past couple months with insideous onset. Reports a couple months ago she started having L sided sciatica pains, electrical/burning sensation down post LLE continuously with standing/OOB activites. Reports L sided LBP that accompanied the LLE pain that was achy/burning in nature. Reports pain when she stands >5-37mns to wash dishes/household chores, and >526ms of walking her dog. Reports she had predisone taper beginning  07/21/20 and began gabapentin, and since has not had LLE or low back pain. Prior to this reports worst pain was 8/10 best 0/10. Lives with her husband who is disabled, that she helps bathe, dress, and transfer into cars. She reports she completes all her driving/community activities, and household chores, that she does not have help, and walks her small dog daily. Patient has 3 small steps to enter her home, no steps inside, and has handicapped accessible bathroom. Pt is trying to increased physical activity, riding recumbant bike 3025m every day, and walking dog 5-67m60mday. Pt denies N/V, B&B changes, unexplained weight fluctuation, saddle paresthesia, fever, night sweats, or unrelenting night pain at this time.    Limitations Lifting;Walking;House hold activities;Standing    How long can you sit comfortably? unlimited    How long can you stand comfortably? 67mi81m  How long can you walk comfortably? 5mins65m Diagnostic tests 07/11/20 Diffuse degenerative disease with prominent levoscoliosis and leftward L3-4 translation. Degenerative anterolisthesis is present at L4-5. Findings have progressed from 2013.    Patient Stated Goals Decrease pain    Pain Onset More than a month ago           Ther-Ex Nustep L4 5minss68m setting 8 UE 8 for gentlestrengthening 5xSTS PT reviewed the following HEP with patient with patient able to demonstrate a set of the following  with min cuing for correction needed. PT educated patient on parameters of therex (how/when to inc/decrease intensity, frequency, rep/set range, stretch hold time, and purpose of therex) with verbalized understanding.  Education on centralization with repeated ext with understanding Access Code: [email or web address] Prone Alternating Arm and Leg Lifts - 1 x daily - 3-4 x weekly - 3 sets - 10 reps Squat with Chair Touch - 1 x daily - 3-4 x weekly - 3 sets - 10 reps Standing Single Leg Stance with Counter Support - 1 x daily - 7 x weekly - 3 sets - 10sec  hold Supine Piriformis Stretch Pulling Heel to Hip - 2-3 x daily - 7 x weekly - 60sec hold Seated Piriformis Stretch - 2-3 x daily - 7 x weekly - 60sec hold              PT Education - 09/04/20 1317    Education Details therex form/technique    Person(s) Educated Patient    Methods Explanation;Demonstration;Verbal cues    Comprehension Verbalized understanding;Returned demonstration;Verbal cues required            PT Short Term Goals - 09/04/20 1309      PT SHORT TERM GOAL #1   Title Pt will be independent with HEP in order to improve strength and decrease back pain in order to improve pain-free function at home and work.    Baseline 07/29/20 HEP given; 10/2             PT Long Term Goals - 09/04/20 1320      PT LONG TERM GOAL #1   Title Pt will demonstrate SLS of 10sec or more to demonstrate age matched norms for decreased fall risk    Baseline 07/29/20 L 2sec R: unable; 09/04/20 L 4sec R 5sec    Time 8    Period Weeks    Status Not Met      PT LONG TERM GOAL #2   Title Pt will increase 6MWT by at least 672m(1613f in order to demonstrate clinically significant improvement in cardiopulmonary and BLE endurance and community ambulation    Baseline 07/29/20 112535f/10 pain in LLE; 09/04/20 1228f45fth no pain    Time 8    Period Weeks    Status Achieved      PT LONG TERM GOAL #3   Title Pt will demonstrate gait speed of at least 1.40m/s272m demonstrate safe community ambulation speed    Baseline 07/29/20 0.72m/s;75m/28/21 1.272m/s 56mime 8    Period Weeks    Status Achieved      PT LONG TERM GOAL #4   Title Patient will increase FOTO score to 68 to demonstrate predicted increase in functional mobility to complete ADLs    Baseline 07/29/20 58; 09/04/20 68    Time 8    Period Weeks    Status Achieved      PT LONG TERM GOAL #5   Title Pt will decrease 5TSTS by at least 3 seconds in order to demonstrate clinically significant improvement in LE strength     Baseline 07/29/20 17sec; 09/04/20    Time 8    Period Weeks    Status Achieved                 Plan - 09/04/20 1341    Clinical Impression Statement PT reassessed goals this session where patient has met all goals of increased strength, gait speed, gait endurance, and ind function. Remaining deficits in balance,  though this has improved. PT reviewed HEP with patient to maitnain reduced pain and ind function progress, and continuing to work toward better static balance/stabilization. Pt is able to verbalize and demonstrate understanding of all HEP recommendations. PT will continue progression as able.    Personal Factors and Comorbidities Age;Comorbidity 1;Comorbidity 2;Comorbidity 3+;Past/Current Experience;Time since onset of injury/illness/exacerbation;Fitness    Comorbidities HTN, neuropathy, chronic LBP, anxiety    Examination-Activity Limitations Squat;Carry;Lift;Stairs;Stand;Locomotion Level;Bend;Transfers;Caring for Others    Examination-Participation Restrictions Community Activity;Cleaning;Laundry;Meal Prep;Other    Stability/Clinical Decision Making Evolving/Moderate complexity    Clinical Decision Making Moderate    Rehab Potential Good    PT Frequency 2x / week    PT Duration 8 weeks    PT Treatment/Interventions ADLs/Self Care Home Management;Stair training;Therapeutic exercise;Spinal Manipulations;Joint Manipulations;Dry needling;Manual techniques;Passive range of motion;Patient/family education;Balance training;Neuromuscular re-education;Therapeutic activities;Gait training;Ultrasound;Traction;Electrical Stimulation;Moist Heat;Functional mobility training;Iontophoresis 55m/ml Dexamethasone;Cryotherapy    PT Next Visit Plan Posture, core/hip ext strengthening    PT Home Exercise Plan bridge, STS without UE, thomas stretch, fig 4 and hip cross-over (into add/IR) stretches    Consulted and Agree with Plan of Care Patient           Patient will benefit from skilled  therapeutic intervention in order to improve the following deficits and impairments:  Abnormal gait, Decreased activity tolerance, Decreased endurance, Decreased range of motion, Decreased strength, Increased fascial restricitons, Improper body mechanics, Pain, Postural dysfunction, Impaired flexibility, Increased muscle spasms, Difficulty walking, Decreased mobility, Decreased coordination, Decreased balance  Visit Diagnosis: Chronic left-sided low back pain with left-sided sciatica  Abnormal posture  Other abnormalities of gait and mobility     Problem List Patient Active Problem List   Diagnosis Date Noted  . Thoracic back pain 05/13/2020  . Chronic anxiety 11/14/2019  . Degenerative arthritis of knee, bilateral 11/14/2019  . Disorder of vein 11/14/2019  . Fibrocystic breast disease 11/14/2019  . Heartburn 11/14/2019  . Knee pain 11/14/2019  . Localized, primary osteoarthritis 11/14/2019  . Pain 11/14/2019  . Osteoarthritis of knee 11/14/2019  . C2 cervical fracture ([place] 11/14/2019  . [place] 11/14/2019  . Nasal fracture 11/14/2019  . History of closed fracture of nasal bones 10/11/2019  . Toe injury 07/25/2019  . Multinodular goiter 06/07/2019  . Subclinical hyperthyroidism 05/14/2019  . [place]chiectasis ([place] 05/14/2019  . Sciatica 05/14/2019  . Thyroid nodule 04/26/2019  . Lung nodule 04/05/2019  . Bilateral lower extremity edema 02/21/2019  . Full thickness rotator cuff tear 01/02/2019  . Left shoulder pain 11/13/2018  . Femoral bruit 06/01/2018  . Allergic rhinitis 05/02/2018  . Obesity (BMI 30.0-34.9) 11/03/2017  . Anxiety 08/05/2016  . Neuropathy 02/02/2016  . Dysphagia 10/09/2012  . Hypertension 04/25/2012  . Low back pain 04/25/2012  . Osteoporosis 04/25/2012  . Raynaud phenomenon 04/25/2012   CDurwin RegesDPT CDurwin Reges10/28/2021, 1:44 PM  [facility] [address] [place] [id number]: [phone]  Fax:  [phone] Name: GZAREYA TUCKETTMRN: [id number] of Birth: 117-Apr-1937

## 2020-09-08 ENCOUNTER — Ambulatory Visit: Payer: PPO | Admitting: Physical Therapy

## 2020-09-11 ENCOUNTER — Encounter: Payer: PPO | Admitting: Physical Therapy

## 2020-09-15 ENCOUNTER — Encounter: Payer: PPO | Admitting: Physical Therapy

## 2020-09-17 ENCOUNTER — Encounter: Payer: PPO | Admitting: Physical Therapy

## 2020-10-20 ENCOUNTER — Telehealth: Payer: Self-pay | Admitting: Internal Medicine

## 2020-10-21 ENCOUNTER — Other Ambulatory Visit: Payer: Self-pay | Admitting: Family Medicine

## 2020-10-21 NOTE — Telephone Encounter (Signed)
I have spoke with Morgan Byrd and her PFT has been scheduled on 10/28/20 @ 1:00pm and her Covid Test has been scheduled for 10/27/20. I have emailed her the patient instructions

## 2020-10-23 ENCOUNTER — Telehealth: Payer: Self-pay

## 2020-10-23 NOTE — Telephone Encounter (Signed)
Patient is aware of date/time of covid test prior to PFT.  

## 2020-10-27 ENCOUNTER — Other Ambulatory Visit: Payer: Self-pay

## 2020-10-27 ENCOUNTER — Other Ambulatory Visit
Admission: RE | Admit: 2020-10-27 | Discharge: 2020-10-27 | Disposition: A | Discharge status: Home / Self Care | Payer: PPO | Source: Ambulatory Visit | Attending: Internal Medicine | Admitting: Internal Medicine

## 2020-10-27 DIAGNOSIS — Z20822 Contact with and (suspected) exposure to covid-19: Secondary | ICD-10-CM | POA: Diagnosis not present

## 2020-10-27 DIAGNOSIS — Z01812 Encounter for preprocedural laboratory examination: Secondary | ICD-10-CM | POA: Insufficient documentation

## 2020-10-27 LAB — SARS CORONAVIRUS 2 (TAT 6-24 HRS): SARS Coronavirus 2: NEGATIVE

## 2020-10-28 ENCOUNTER — Ambulatory Visit: Payer: PPO | Attending: Internal Medicine

## 2020-10-28 DIAGNOSIS — J479 Bronchiectasis, uncomplicated: Secondary | ICD-10-CM

## 2020-10-28 LAB — PULMONARY FUNCTION TEST ARMC ONLY
DL/VA % pred: 75 %
DL/VA: 3.14 ml/min/mmHg/L
DLCO unc % pred: 84 %
DLCO unc: 14.16 ml/min/mmHg
FEF 25-75 Post: 2.33 L/sec
FEF 25-75 Pre: 2.34 L/sec
FEF2575-%Change-Post: 0 %
FEF2575-%Pred-Post: 205 %
FEF2575-%Pred-Pre: 206 %
FEV1-%Change-Post: 0 %
FEV1-%Pred-Post: 127 %
FEV1-%Pred-Pre: 128 %
FEV1-Post: 2.03 L
FEV1-Pre: 2.04 L
FEV1FVC-%Change-Post: 1 %
FEV1FVC-%Pred-Pre: 113 %
FEV6-%Change-Post: -2 %
FEV6-%Pred-Post: 118 %
FEV6-%Pred-Pre: 120 %
FEV6-Post: 2.4 L
FEV6-Pre: 2.45 L
FEV6FVC-%Pred-Post: 106 %
FEV6FVC-%Pred-Pre: 106 %
FVC-%Change-Post: -2 %
FVC-%Pred-Post: 110 %
FVC-%Pred-Pre: 113 %
FVC-Post: 2.4 L
Post FEV1/FVC ratio: 85 %
Post FEV6/FVC ratio: 100 %
Pre FEV1/FVC ratio: 83 %
Pre FEV6/FVC Ratio: 100 %
RV % pred: 84 %
RV: 1.92 L
TLC % pred: 99 %
TLC: 4.6 L

## 2020-10-28 MED ORDER — ALBUTEROL SULFATE (2.5 MG/3ML) 0.083% IN NEBU
2.5000 mg | INHALATION_SOLUTION | Freq: Once | RESPIRATORY_TRACT | Status: AC
  Administered 2020-10-28: 2.5 mg via RESPIRATORY_TRACT
  Filled 2020-10-28: qty 3

## 2020-11-14 ENCOUNTER — Other Ambulatory Visit: Payer: Self-pay

## 2020-11-18 ENCOUNTER — Telehealth: Payer: Self-pay | Admitting: Internal Medicine

## 2020-11-18 ENCOUNTER — Other Ambulatory Visit: Payer: Self-pay

## 2020-11-18 ENCOUNTER — Telehealth: Payer: Self-pay | Admitting: Family Medicine

## 2020-11-18 ENCOUNTER — Ambulatory Visit (INDEPENDENT_AMBULATORY_CARE_PROVIDER_SITE_OTHER): Payer: PPO | Admitting: Family Medicine

## 2020-11-18 ENCOUNTER — Encounter: Payer: Self-pay | Admitting: Family Medicine

## 2020-11-18 VITALS — BP 140/80 | HR 59 | Temp 98.4°F | Ht 61.0 in | Wt 139.0 lb

## 2020-11-18 DIAGNOSIS — E042 Nontoxic multinodular goiter: Secondary | ICD-10-CM | POA: Diagnosis not present

## 2020-11-18 DIAGNOSIS — E663 Overweight: Secondary | ICD-10-CM

## 2020-11-18 DIAGNOSIS — I10 Essential (primary) hypertension: Secondary | ICD-10-CM | POA: Diagnosis not present

## 2020-11-18 DIAGNOSIS — R7989 Other specified abnormal findings of blood chemistry: Secondary | ICD-10-CM

## 2020-11-18 DIAGNOSIS — F439 Reaction to severe stress, unspecified: Secondary | ICD-10-CM | POA: Insufficient documentation

## 2020-11-18 DIAGNOSIS — M5432 Sciatica, left side: Secondary | ICD-10-CM

## 2020-11-18 DIAGNOSIS — R911 Solitary pulmonary nodule: Secondary | ICD-10-CM

## 2020-11-18 DIAGNOSIS — M8000XD Age-related osteoporosis with current pathological fracture, unspecified site, subsequent encounter for fracture with routine healing: Secondary | ICD-10-CM

## 2020-11-18 LAB — COMPREHENSIVE METABOLIC PANEL
ALT: 27 U/L (ref 0–35)
AST: 30 U/L (ref 0–37)
Albumin: 4.5 g/dL (ref 3.5–5.2)
Alkaline Phosphatase: 57 U/L (ref 39–117)
BUN: 17 mg/dL (ref 6–23)
CO2: 33 mEq/L — ABNORMAL HIGH (ref 19–32)
Calcium: 10.3 mg/dL (ref 8.4–10.5)
Chloride: 102 mEq/L (ref 96–112)
Creatinine, Ser: 0.78 mg/dL (ref 0.40–1.20)
GFR: 70.9 mL/min (ref >60.00)
Glucose, Bld: 99 mg/dL (ref 70–99)
Potassium: 4.7 mEq/L (ref 3.5–5.1)
Sodium: 140 mEq/L (ref 135–145)
Total Bilirubin: 0.7 mg/dL (ref 0.2–1.2)
Total Protein: 7.3 g/dL (ref 6.0–8.3)

## 2020-11-18 LAB — LIPID PANEL
Cholesterol: 197 mg/dL (ref 0–200)
HDL: 69 mg/dL (ref >39.00)
LDL Cholesterol: 114 mg/dL — ABNORMAL HIGH (ref 0–99)
NonHDL: 128
Total CHOL/HDL Ratio: 3
Triglycerides: 72 mg/dL (ref 0.0–149.0)
VLDL: 14.4 mg/dL (ref 0.0–40.0)

## 2020-11-18 LAB — VITAMIN D 25 HYDROXY (VIT D DEFICIENCY, FRACTURES): VITD: >120 ng/mL

## 2020-11-18 NOTE — Progress Notes (Signed)
Tommi Rumps, MD Phone: [phone]  Morgan Byrd is a 85 y.o. female who presents today for f/u.  HYPERTENSION  Disease Monitoring  Home BP Monitoring 120s-150/<70 Chest pain- no    Dyspnea- no Medications  Compliance-  Taking irbesartan, HCTZ.  Edema- no  Multinodular goiter: Continues to see endocrinology.  Had a stable ultrasound in May 2021.  Lung nodule: Patient notes 5 years of smoking back in the 1970s.  No family history of lung cancer.  Due for lung CT.  Chronic low back pain with sciatica: Patient notes constant pain running down the left leg in the lateral aspect.  Also has pain in her right leg from her buttock to her knee. Notes chronic urinary urge incontinence. No stool incontinence. No numbness. PT was not beneficial and she notes she completed about a month of this. She saw neurosurgery and they gave her gabapentin for presumed nerve impingement.  She notes that was minimally beneficial.  Osteoporosis: Patient notes she is not able to remember to take the Actonel once a month.  She wonders about other options.  Stress: The patient's son has Wegener's and has been in and out of the hospital recently.  He was quite sick with an aortic issue.  He is doing better now.  Her husband has Parkinson's.  She notes these things are quite stressful.  She notes no depression.  Overweight: The patient notes she is lost a fair amount of weight by doing weight watchers.  She is quite happy with how she has been doing with this.   Social History   Tobacco Use  Smoking Status Former Smoker  . Packs/day: 1.00  . Years: 4.00  . Pack years: 4.00  . Types: Cigarettes  . Quit date: 40  . Years since quitting: 48.0  Smokeless Tobacco Never Used    Current Outpatient Medications on File Prior to Visit  Medication Sig Dispense Refill  . ALLERGY RELIEF 180 MG tablet TAKE ONE TABLET EVERY DAY 90 tablet 1  . aspirin 81 MG EC tablet Take 81 mg by mouth daily. Swallow whole.     . B Complex-C (SUPER B COMPLEX PO) Take by mouth daily.     Marland Kitchen CALCIUM-MAGNESIUM-ZINC PO Take by mouth daily.     . cholecalciferol (VITAMIN D) 1000 UNITS tablet Take 1,000 Units by mouth daily.    . fluticasone (FLONASE) 50 MCG/ACT nasal spray TAKE 2 SPRAYS INTO BOTH NOSTRILS DAILY 16 g 1  . FLUZONE HIGH-DOSE QUADRIVALENT 0.7 ML SUSY     . gabapentin (NEURONTIN) 300 MG capsule Take by mouth.    . hydrochlorothiazide (MICROZIDE) 12.5 MG capsule TAKE 1 CAPSULE EVERY DAY 90 capsule 3  . irbesartan (AVAPRO) 150 MG tablet Take 1 tablet (150 mg total) by mouth daily. 30 tablet 6  . methimazole (TAPAZOLE) 5 MG tablet Take 2.5 mg by mouth daily.    . metoprolol tartrate (LOPRESSOR) 25 MG tablet Take 1 tablet (25 mg total) by mouth 2 (two) times daily. 60 tablet 6  . montelukast (SINGULAIR) 10 MG tablet TAKE ONE TABLET BY MOUTH AT BEDTIME 90 tablet 1  . Multiple Vitamin (MULTIVITAMIN) capsule Take 1 capsule by mouth daily.     No current facility-administered medications on file prior to visit.     ROS see history of present illness  Objective  Physical Exam Vitals:   11/18/20 0934  BP: 140/80  Pulse: (!) 59  Temp: 98.4 F (36.9 C)  SpO2: 98%    BP Readings  from Last 3 Encounters:  11/18/20 140/80  05/13/20 130/70  04/18/20 (!) 144/82   Wt Readings from Last 3 Encounters:  11/18/20 139 lb (63 kg)  05/13/20 162 lb 12.8 oz (73.8 kg)  04/18/20 163 lb (73.9 kg)    Physical Exam Constitutional:      General: She is not in acute distress.    Appearance: She is not diaphoretic.  Cardiovascular:     Rate and Rhythm: Normal rate and regular rhythm.     Heart sounds: Normal heart sounds.  Pulmonary:     Effort: Pulmonary effort is normal.     Breath sounds: Normal breath sounds.  Musculoskeletal:        General: No edema.     Comments: No midline spine tenderness, no midline spine step-off, no muscular back tenderness  Skin:    General: Skin is warm and dry.  Neurological:      Mental Status: She is alert.     Comments: 5/5 strength bilateral quads, hamstrings, plantar flexion, and dorsiflexion, sensation light touch intact bilateral lower extremities, 2+ patellar reflexes, negative straight leg raise bilaterally      Assessment/Plan: Please see individual problem list.  Problem List Items Addressed This Visit    Hypertension - Primary    Adequate control for age.  She will continue irbesartan 150 mg daily and HCTZ 12.5 mg daily.  Check CMP.      Relevant Orders   Comp Met (CMET)   Lipid panel   Lung nodule    CT scan ordered.      Relevant Orders   CT Chest Wo Contrast   Multinodular goiter    She will continue to follow with endocrinology.  Most recent ultrasound was stable in May 2021.      Osteoporosis    We will attempt to get her approved for Prolia.  Discussed risk of osteonecrosis of the jaw.      Relevant Orders   Vitamin D (25 hydroxy)   Overweight    Encouraged continued dietary changes.      Sciatica    Patient has continued symptoms.  She has undergone a course of physical therapy and has been on gabapentin with only mild benefit.  We will obtain an MRI.      Relevant Medications   gabapentin (NEURONTIN) 300 MG capsule   Other Relevant Orders   MR Lumbar Spine Wo Contrast   Stress    Offered support.  She will monitor.  Discussed that if she needs anybody to talk to there is always somebody here that she can reach out to.         This visit occurred during the SARS-CoV-2 public health emergency.  Safety protocols were in place, including screening questions prior to the visit, additional usage of staff PPE, and extensive cleaning of exam room while observing appropriate contact time as indicated for disinfecting solutions.    Tommi Rumps, MD [place]

## 2020-11-18 NOTE — Assessment & Plan Note (Signed)
CT scan ordered

## 2020-11-18 NOTE — Assessment & Plan Note (Signed)
Patient has continued symptoms.  She has undergone a course of physical therapy and has been on gabapentin with only mild benefit.  We will obtain an MRI.

## 2020-11-18 NOTE — Assessment & Plan Note (Signed)
We will attempt to get her approved for Prolia.  Discussed risk of osteonecrosis of the jaw.

## 2020-11-18 NOTE — Telephone Encounter (Signed)
Notify pt that her vitamin D level is elevated.  Need to stop vitamin D supplements. Please confirm what she is taking.  Dr Caryl Bis will address her other labs.  Please send as FYI to Dr Caryl Bis.

## 2020-11-18 NOTE — Patient Instructions (Signed)
Nice to see you. We will get labs today.  We will get a CT chest and MRI lumbar spine scheduled for you. We will contact you about the Prolia for your osteoporosis.

## 2020-11-18 NOTE — Telephone Encounter (Signed)
lft vm for pt to call ofc to sch MRI and CT. Thanks

## 2020-11-18 NOTE — Telephone Encounter (Signed)
Notify pt that her vitamin D level is elevated.  Need to stop vitamin D supplements. Please confirm what she is taking.  Dr Caryl Bis will address her other labs.  Please send as FYI to Dr Caryl Bis. The vitamin d was handled by Dr. Nicki Reaper.  Morgan Byrd,cma

## 2020-11-18 NOTE — Assessment & Plan Note (Signed)
Offered support.  She will monitor.  Discussed that if she needs anybody to talk to there is always somebody here that she can reach out to.

## 2020-11-18 NOTE — Assessment & Plan Note (Signed)
Encouraged continued dietary changes. 

## 2020-11-18 NOTE — Assessment & Plan Note (Signed)
Adequate control for age.  She will continue irbesartan 150 mg daily and HCTZ 12.5 mg daily.  Check CMP.

## 2020-11-18 NOTE — Assessment & Plan Note (Signed)
She will continue to follow with endocrinology.  Most recent ultrasound was stable in May 2021.

## 2020-11-19 ENCOUNTER — Other Ambulatory Visit: Payer: Self-pay | Admitting: *Deleted

## 2020-11-19 MED ORDER — IRBESARTAN 150 MG PO TABS: 150.0000 mg | ORAL_TABLET | Freq: Every day | ORAL | 0 refills | Status: DC

## 2020-11-19 MED ORDER — METOPROLOL TARTRATE 25 MG PO TABS: 25.0000 mg | ORAL_TABLET | Freq: Two times a day (BID) | ORAL | 0 refills | Status: DC

## 2020-11-19 NOTE — Telephone Encounter (Signed)
Did you call the patient regarding this?  She needs to stop any vitamin D supplements.  Please see how much vitamin D she has been taking.  Thank you.

## 2020-11-19 NOTE — Telephone Encounter (Signed)
I spoke with the patient and informed her to stop taking the vitamin  D. The patient stated she takes 2 5000 mg tablets per day.  I informed her that she will be called later to schedule a lab to recheck.  She understood.  Morgan Byrd,cma

## 2020-11-19 NOTE — Telephone Encounter (Signed)
Pt returned your call. Please call back on cell [phone]

## 2020-11-19 NOTE — Telephone Encounter (Signed)
Left message with the husband for the patient to call me back.  Tniya Bowditch,cma

## 2020-11-20 NOTE — Telephone Encounter (Signed)
Callee and LVM informing the patient that she needed to recheck her vitamin d level in 2 weeks.  Daton Szilagyi,cma

## 2020-11-20 NOTE — Addendum Note (Signed)
Addended by: Caryl Bis, Zenas Santa G on: 11/20/2020 11:55 AM   Modules accepted: Orders

## 2020-11-20 NOTE — Telephone Encounter (Signed)
Lab ordered. Please get her scheduled to recheck this in 2 weeks.

## 2020-11-21 ENCOUNTER — Ambulatory Visit: Payer: PPO | Admitting: Adult Health

## 2020-11-21 ENCOUNTER — Other Ambulatory Visit: Payer: Self-pay

## 2020-11-21 ENCOUNTER — Encounter: Payer: Self-pay | Admitting: Adult Health

## 2020-11-21 DIAGNOSIS — J479 Bronchiectasis, uncomplicated: Secondary | ICD-10-CM

## 2020-11-21 DIAGNOSIS — J3089 Other allergic rhinitis: Secondary | ICD-10-CM | POA: Diagnosis not present

## 2020-11-21 NOTE — Assessment & Plan Note (Signed)
Patient is continue on allergy medicines with Flonase and Allegra.  Saline nasal spray as needed.  And continue on daily Singulair

## 2020-11-21 NOTE — Progress Notes (Signed)
@Patient  ID: Morgan Byrd, female    DOB: 06/12/1936, 85 y.o.   MRN: [id number]  Chief Complaint  Patient presents with  . Follow-up    Referring provider: Leone Haven, MD  HPI: 85 yo former smoker followed for Bronchiectasis   TEST/EVENTS :  CT chest from May 2020 Lungs/Pleura: Mild scarring, volume loss and bronchiectasis in the right middle lobe, lingula and both lower lobes. 3 mm apical segment right upper lobe nodule (series 3, image 33), nonspecific. No pleural fluid. Airway is unremarkable.  Upper Abdomen: Visualized portions of the liver, adrenal glands, kidneys, spleen, pancreas, stomach and bowel are grossly unremarkable. Cholecystectomy.  Musculoskeletal: No worrisome lytic or sclerotic lesions. Degenerative changes in the spine and shoulders.  IMPRESSION: 1. No acute findings in the chest. No pulmonary nodule or mass. 2. 3 mm apical segment right upper lobe nodule. No follow-up needed if patient is low-risk. Non-contrast chest CT can be considered in 12 months if patient is high-risk. This recommendation follows the consensus statement: Guidelines for Management of Incidental Pulmonary Nodules Detected on CT Images: From the Fleischner Society 2017; Radiology 2017; 284:228-243. 3. Left thyroid nodule. Consider further evaluation with thyroid ultrasound. If patient is clinically hyperthyroid, consider nuclear medicine thyroid uptake and scan. 4. Aortic atherosclerosis (ICD10-170.0).  June 2020 QuantiFERON gold negative -Rheumatoid factor - June 2020  Alpha1 MZ , 84   11/21/2020 Follow up : Bronchiectasis  Patient returns for a 6 month follow up. She has mild Bronchiectasis  Previous alpha-1 testing showed MZ phenotype and a level at 58.  Patient is very active.  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  Does her yard work all of her housework and shopping.  She has minimum cough.  Does have some throat clearing with postnasal  drainage on occasion.  She is fully vaccinated for COVID-19.  With COVID-vaccine times 2+ her booster she has minimal shortness of breath.  She did have pulmonary function testing done last month that showed normal lung function with an FEV1 at 127%, ratio 85, FVC 110%.  No significant bronchodilator response.  And DLCO at 84%.  We went over her test results.  Patient is quite active for her age.  Has minimal breathing problems. She was started on Spiriva last visit but was unable to tolerate it because of dizziness.  Allergies  Allergen Reactions  . Codeine Nausea And Vomiting  . Fosamax [Alendronate Sodium]   . Lexapro [Escitalopram Oxalate]   . Spiriva Respimat [Tiotropium Bromide Monohydrate]     dizziness    Immunization History  Administered Date(s) Administered  . Fluad Quad(high Dose 65+) 07/25/2019  . Influenza Nasal 08/04/2020  . Influenza, High Dose Seasonal PF 08/05/2016, 08/08/2017, 08/14/2018  . Influenza,inj,Quad PF,6+ Mos 08/24/2014, 08/05/2015  . Influenza-Unspecified 08/08/2012, 08/11/2013, 08/24/2014, 08/05/2015  . PFIZER SARS-COV-2 Vaccination 11/14/2019, 12/05/2019, 08/07/2020  . Pneumococcal Conjugate-13 05/03/2014  . Pneumococcal Polysaccharide-23 04/25/2005  . Tdap 04/25/2012, 10/04/2019  . Zoster 06/26/2011  . Zoster Recombinat (Shingrix) 08/08/2017    Past Medical History:  Diagnosis Date  . Anxiety   . Hypertension     Tobacco History: Social History   Tobacco Use  Smoking Status Former Smoker  . Packs/day: 1.00  . Years: 4.00  . Pack years: 4.00  . Types: Cigarettes  . Quit date: 77  . Years since quitting: 48.0  Smokeless Tobacco Never Used   Counseling given: Not Answered   Outpatient Medications Prior to Visit  Medication Sig Dispense Refill  .  ALLERGY RELIEF 180 MG tablet TAKE ONE TABLET EVERY DAY 90 tablet 1  . aspirin 81 MG EC tablet Take 81 mg by mouth daily. Swallow whole.    . B Complex-C (SUPER B COMPLEX PO) Take by mouth  daily.     Marland Kitchen CALCIUM-MAGNESIUM-ZINC PO Take by mouth daily.     . fluticasone (FLONASE) 50 MCG/ACT nasal spray TAKE 2 SPRAYS INTO BOTH NOSTRILS DAILY 16 g 1  . FLUZONE HIGH-DOSE QUADRIVALENT 0.7 ML SUSY     . gabapentin (NEURONTIN) 300 MG capsule Take by mouth.    . hydrochlorothiazide (MICROZIDE) 12.5 MG capsule TAKE 1 CAPSULE EVERY DAY 90 capsule 3  . irbesartan (AVAPRO) 150 MG tablet Take 1 tablet (150 mg total) by mouth daily. 30 tablet 0  . methimazole (TAPAZOLE) 5 MG tablet Take 2.5 mg by mouth daily.    . metoprolol tartrate (LOPRESSOR) 25 MG tablet Take 1 tablet (25 mg total) by mouth 2 (two) times daily. 60 tablet 0  . montelukast (SINGULAIR) 10 MG tablet TAKE ONE TABLET BY MOUTH AT BEDTIME 90 tablet 1  . Multiple Vitamin (MULTIVITAMIN) capsule Take 1 capsule by mouth daily.    . cholecalciferol (VITAMIN D) 1000 UNITS tablet Take 1,000 Units by mouth daily. (Patient not taking: Reported on 11/21/2020)     No facility-administered medications prior to visit.     Review of Systems:   Constitutional:   No  weight loss, night sweats,  Fevers, chills, fatigue, or  lassitude.  HEENT:   No headaches,  Difficulty swallowing,  Tooth/dental problems, or  Sore throat,                No sneezing, itching, ear ache, + nasal congestion, post nasal drip,   CV:  No chest pain,  Orthopnea, PND, swelling in lower extremities, anasarca, dizziness, palpitations, syncope.   GI  No heartburn, indigestion, abdominal pain, nausea, vomiting, diarrhea, change in bowel habits, loss of appetite, bloody stools.   Resp: No shortness of breath with exertion or at rest.  No excess mucus, no productive cough,  No non-productive cough,  No coughing up of blood.  No change in color of mucus.  No wheezing.  No chest wall deformity  Skin: no rash or lesions.  GU: no dysuria, change in color of urine, no urgency or frequency.  No flank pain, no hematuria   MS:  No joint pain or swelling.  No decreased range of  motion.  No back pain.    Physical Exam  BP (!) 142/78 (BP Location: Left Arm, Cuff Size: Normal)   Pulse 60   Temp (!) 96.9 F (36.1 C) (Temporal)   Ht 5\' 1"  (1.549 m)   Wt 142 lb 6.4 oz (64.6 kg)   SpO2 96%   BMI 26.91 kg/m   GEN: A/Ox3; pleasant , NAD, well nourished    HEENT:  [place]/AT,    NOSE-clear, THROAT-clear, no lesions, no postnasal drip or exudate noted.   NECK:  Supple w/ fair ROM; no JVD; normal carotid impulses w/o bruits; no thyromegaly or nodules palpated; no lymphadenopathy.    RESP  Clear  P & A; w/o, wheezes/ rales/ or rhonchi. no accessory muscle use, no dullness to percussion  CARD:  RRR, no m/r/g, no peripheral edema, pulses intact, no cyanosis or clubbing.  GI:   Soft & nt; nml bowel sounds; no organomegaly or masses detected.   Musco: Warm bil, no deformities or joint swelling noted.   Neuro: alert, no focal  deficits noted.    Skin: Warm, no lesions or rashes    Lab Results:  ProBNP No results found for: PROBNP  Imaging: No results found.  albuterol (PROVENTIL) (2.5 MG/3ML) 0.083% nebulizer solution 2.5 mg    Date Action Dose Route User   10/28/2020 1311 Given 2.5 mg Nebulization Lowrance, Amy, RRT      PFT Results Latest Ref Rng & Units 10/28/2020  FVC-Predicted Pre % 113  FVC-Post L 2.40  FVC-Predicted Post % 110  Pre FEV1/FVC % % 83  Post FEV1/FCV % % 85  FEV1-Pre L 2.04  FEV1-Predicted Pre % 128  FEV1-Post L 2.03  DLCO uncorrected ml/min/mmHg 14.16  DLCO UNC% % 84  DLVA Predicted % 75  TLC L 4.60  TLC % Predicted % 99  RV % Predicted % 84    No results found for: NITRICOXIDE      Assessment & Plan:   Bronchiectasis (HCC) Mild bronchiectasis.  Pulmonary function testing shows normal lung function.  Despite having MZ alpha-1 and slightly low level at 82.  Patient has normal lung function on PFTs.  Minimum pulmonary symptoms.  Patient is advised to continue with her activities as tolerated.  Hold on any inhalers at  this time.  I continue to follow in the clinic on a routine basis.  Plan  Patient Instructions  Keep up good work  Continue on current regimen  Follow up with Dr. Chase Caller in 6 months and As needed        Allergic rhinitis Patient is continue on allergy medicines with Flonase and Allegra.  Saline nasal spray as needed.  And continue on daily Singulair     Rexene Edison, NP 11/21/2020

## 2020-11-21 NOTE — Assessment & Plan Note (Signed)
Mild bronchiectasis.  Pulmonary function testing shows normal lung function.  Despite having MZ alpha-1 and slightly low level at 82.  Patient has normal lung function on PFTs.  Minimum pulmonary symptoms.  Patient is advised to continue with her activities as tolerated.  Hold on any inhalers at this time.  I continue to follow in the clinic on a routine basis.  Plan  Patient Instructions  Keep up good work  Continue on current regimen  Follow up with Dr. Chase Caller in 6 months and As needed

## 2020-11-21 NOTE — Patient Instructions (Signed)
Keep up good work  Continue on current regimen  Follow up with Dr. Chase Caller in 6 months and As needed

## 2020-11-22 ENCOUNTER — Other Ambulatory Visit: Payer: Self-pay | Admitting: Family Medicine

## 2020-11-24 NOTE — Telephone Encounter (Signed)
Appointment for labs scheduled.  Oracio Galen,cma

## 2020-11-28 ENCOUNTER — Other Ambulatory Visit: Payer: Self-pay

## 2020-11-28 ENCOUNTER — Ambulatory Visit
Admission: RE | Admit: 2020-11-28 | Discharge: 2020-11-28 | Disposition: A | Discharge status: Home / Self Care | Payer: PPO | Source: Ambulatory Visit | Attending: Family Medicine | Admitting: Family Medicine

## 2020-11-28 DIAGNOSIS — M4316 Spondylolisthesis, lumbar region: Secondary | ICD-10-CM | POA: Diagnosis not present

## 2020-11-28 DIAGNOSIS — M5432 Sciatica, left side: Secondary | ICD-10-CM | POA: Insufficient documentation

## 2020-11-28 DIAGNOSIS — I517 Cardiomegaly: Secondary | ICD-10-CM | POA: Diagnosis not present

## 2020-11-28 DIAGNOSIS — M5116 Intervertebral disc disorders with radiculopathy, lumbar region: Secondary | ICD-10-CM | POA: Diagnosis not present

## 2020-11-28 DIAGNOSIS — R911 Solitary pulmonary nodule: Secondary | ICD-10-CM | POA: Diagnosis not present

## 2020-11-28 DIAGNOSIS — M5117 Intervertebral disc disorders with radiculopathy, lumbosacral region: Secondary | ICD-10-CM | POA: Diagnosis not present

## 2020-11-28 DIAGNOSIS — I700 Atherosclerosis of aorta: Secondary | ICD-10-CM | POA: Diagnosis not present

## 2020-11-28 DIAGNOSIS — Q278 Other specified congenital malformations of peripheral vascular system: Secondary | ICD-10-CM | POA: Diagnosis not present

## 2020-11-28 DIAGNOSIS — M48061 Spinal stenosis, lumbar region without neurogenic claudication: Secondary | ICD-10-CM | POA: Diagnosis not present

## 2020-11-28 DIAGNOSIS — J984 Other disorders of lung: Secondary | ICD-10-CM | POA: Diagnosis not present

## 2020-12-01 IMAGING — MG MM DIGITAL DIAGNOSTIC BILAT W/ TOMO W/ CAD
6 of 10 series · 6 of 30 positions shown · non-contrast
Comparison: Previous exam(s).

CLINICAL DATA: Recall from screening for right breast mass and left
breast asymmetry.

EXAM:
DIGITAL DIAGNOSTIC bilateral MAMMOGRAM WITH TOMO
ULTRASOUND bilateral BREAST

[R CC synth-2D (1 of 2)]
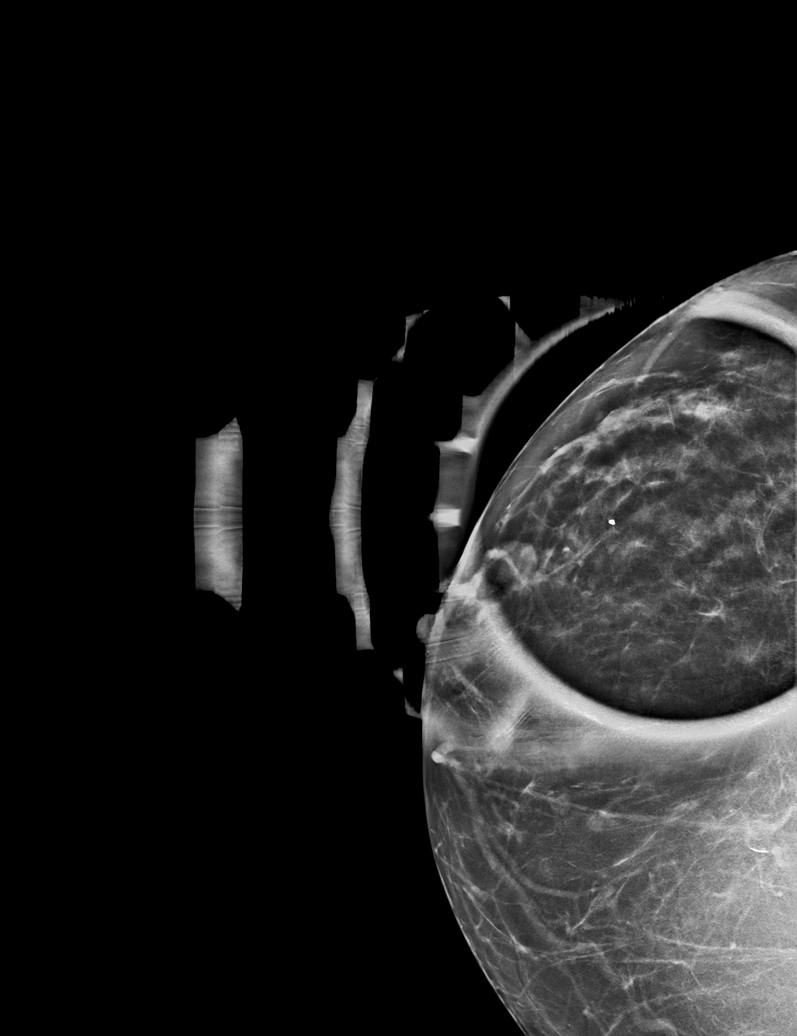

[L CC synth-2D]
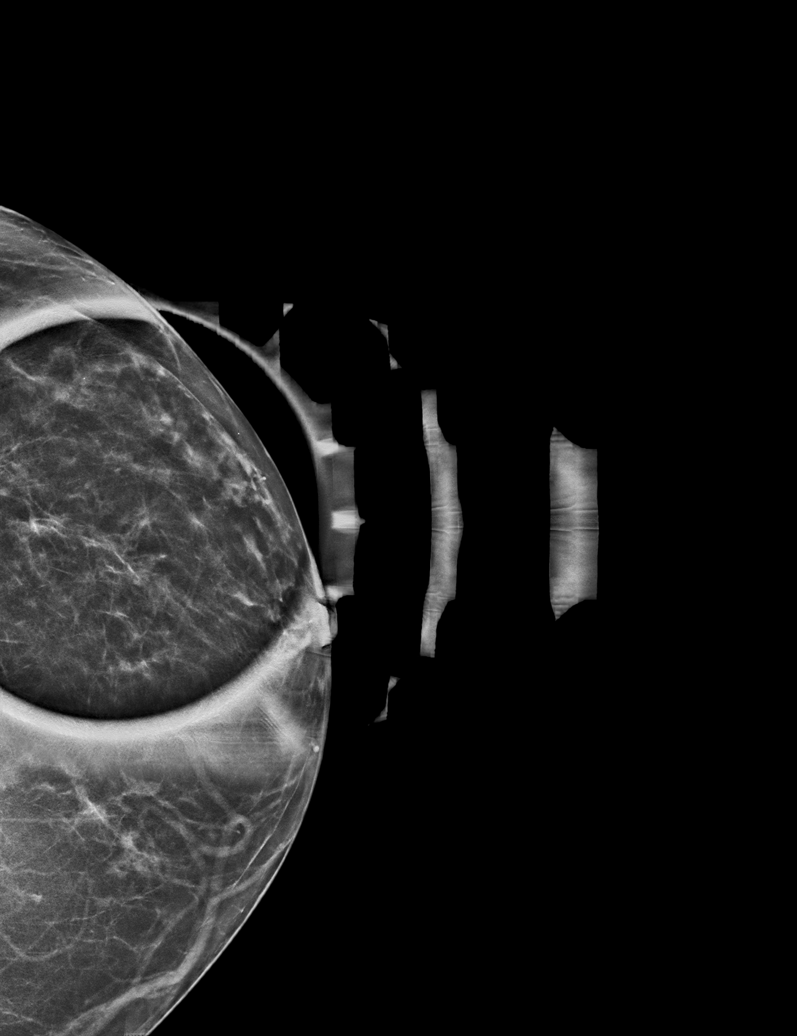

[R MLO synth-2D]
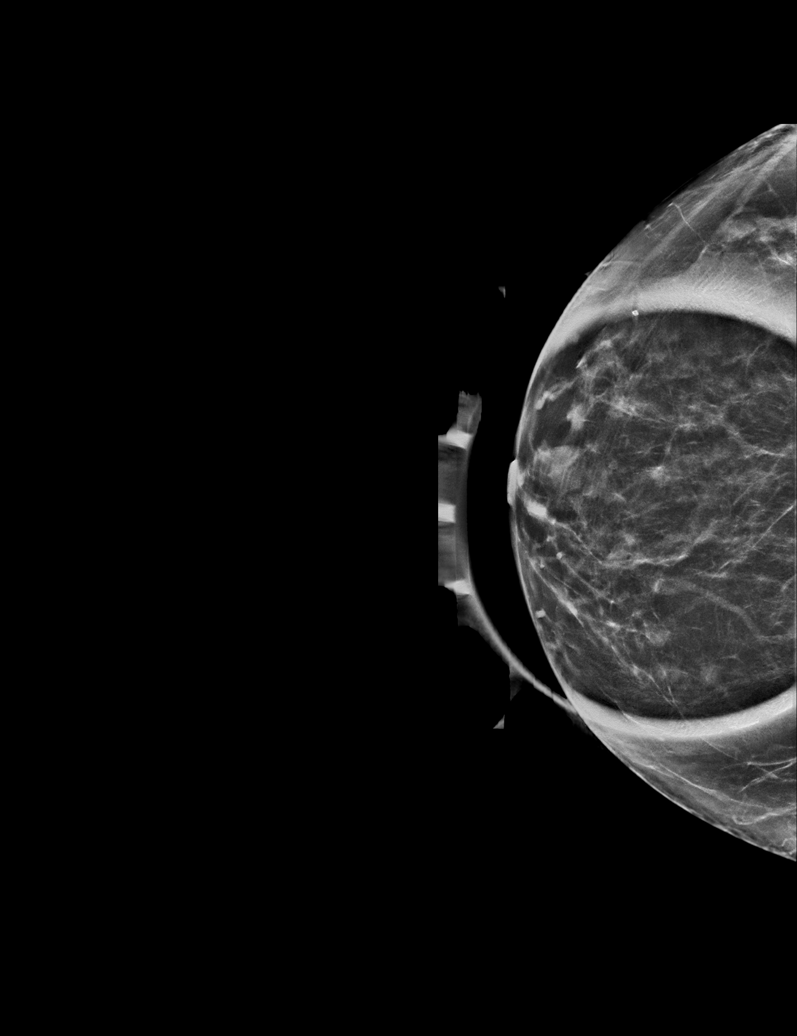

[L ML synth-2D]
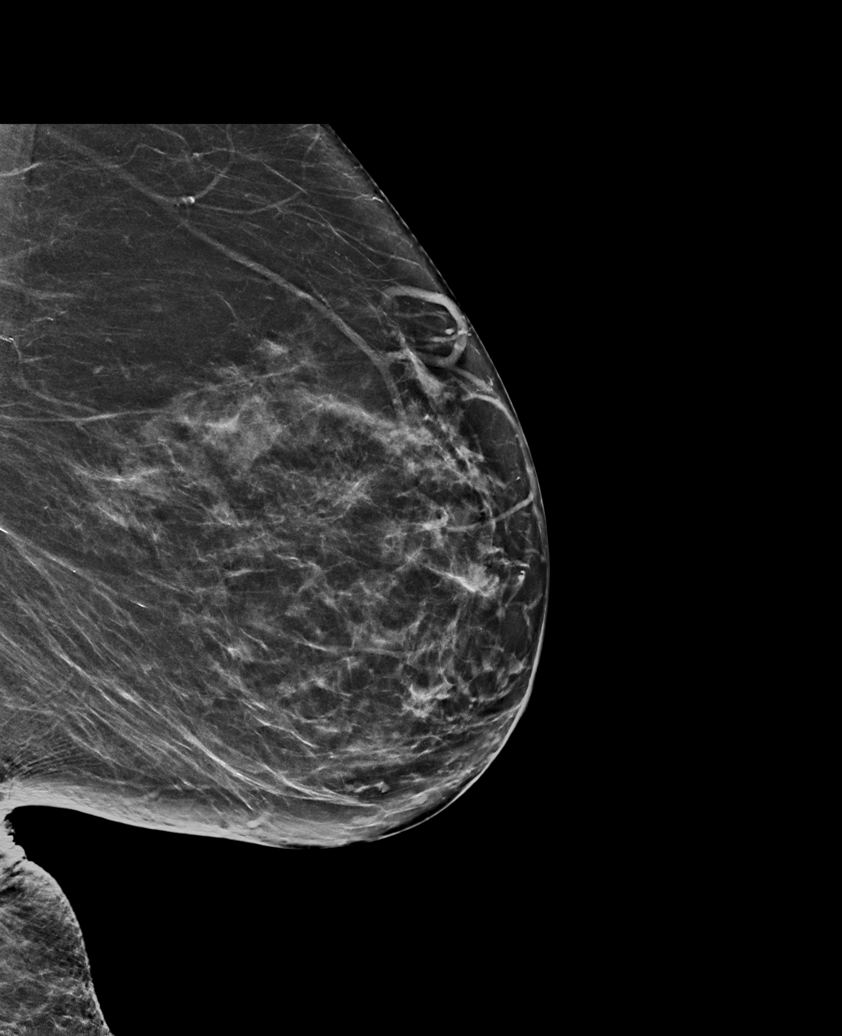

[R CC synth-2D (2 of 2)]
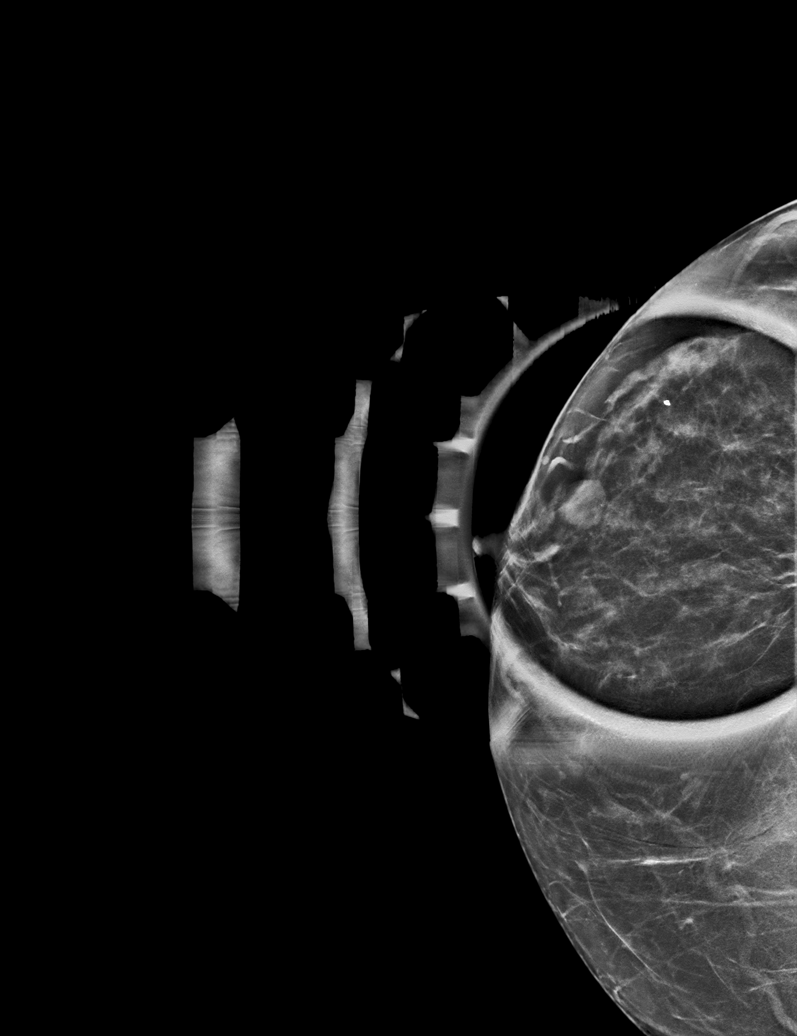

[L CC tomo · tomo slice 26/51.0]
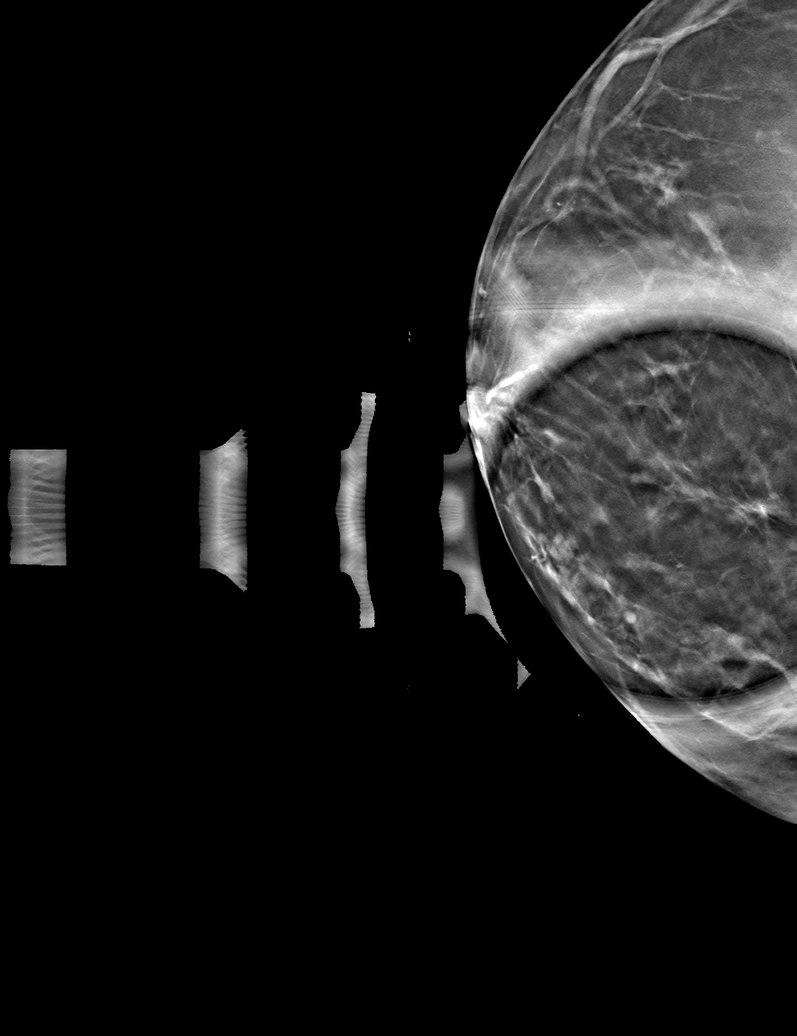

[6 of 30 positions shown; findings below may reference images not displayed]

ACR Breast Density Category b: There are scattered areas of
fibroglandular density.
FINDINGS: Multiple additional images were obtained bilaterally. There is an
oval circumscribed 9 mm mass over the outer lower right periareolar
region. Additional images of the left breast demonstrate no definite
persistent asymmetry as the findings likely due to overlapping
fibroglandular tissue.

Targeted ultrasound is performed, showing an oval circumscribed cyst
over the 8 o'clock position of the right breast 1 cm from the nipple
accounting for the mammographic finding. This measures 0.9 x 0.9 x
1.0 cm. No internal vascularity.

Ultrasound exam over the outer left periareolar region demonstrates
a small cyst cluster measuring 0.6 x 0.6 x 1.5 cm likely accounting
for the mammographic asymmetry.
IMPRESSION: 1 cm cyst over the 8 o'clock position of the right periareolar
region and 1.5 cm cyst cluster over the outer left periareolar
region accounting for the mammographic findings.

RECOMMENDATION:
Recommend continued annual bilateral screening mammographic
follow-up.

I have discussed the findings and recommendations with the patient.
If applicable, a reminder letter will be sent to the patient
regarding the next appointment.

BI-RADS CATEGORY  2: Benign.

## 2020-12-04 ENCOUNTER — Other Ambulatory Visit: Payer: Self-pay

## 2020-12-04 ENCOUNTER — Telehealth: Payer: Self-pay

## 2020-12-04 ENCOUNTER — Telehealth: Payer: Self-pay | Admitting: Internal Medicine

## 2020-12-04 ENCOUNTER — Other Ambulatory Visit (INDEPENDENT_AMBULATORY_CARE_PROVIDER_SITE_OTHER): Payer: PPO

## 2020-12-04 DIAGNOSIS — R7989 Other specified abnormal findings of blood chemistry: Secondary | ICD-10-CM

## 2020-12-04 LAB — VITAMIN D 25 HYDROXY (VIT D DEFICIENCY, FRACTURES): VITD: 114.71 ng/mL (ref 30.00–100.00)

## 2020-12-04 NOTE — Telephone Encounter (Signed)
Vitamin D elevated 114.71 stop all vitamin D and f/u with PCP in 1-2 months to repeat lab

## 2020-12-04 NOTE — Telephone Encounter (Signed)
Left message to call back for lab results.

## 2020-12-05 NOTE — Telephone Encounter (Signed)
I called and informed the patient to stop all vitamin d and she understood. Patient scheduled a follow up with pcp in march.  Morgan Byrd,cma

## 2020-12-09 ENCOUNTER — Telehealth: Payer: Self-pay | Admitting: Family Medicine

## 2020-12-09 DIAGNOSIS — M48061 Spinal stenosis, lumbar region without neurogenic claudication: Secondary | ICD-10-CM

## 2020-12-09 NOTE — Telephone Encounter (Signed)
Patient called back and stated Dr. Cari Caraway is the Neurosurgeon she wants and she checked with her insurance and he is covered.  Kadesia Robel,cma

## 2020-12-09 NOTE — Telephone Encounter (Signed)
Pt called back to follow up on MRI and to give information   Message in MRI result note

## 2020-12-11 NOTE — Addendum Note (Signed)
Addended by: Leone Haven on: 12/11/2020 09:55 AM   Modules accepted: Orders

## 2020-12-11 NOTE — Telephone Encounter (Signed)
Referral placed.

## 2020-12-15 ENCOUNTER — Other Ambulatory Visit: Payer: Self-pay

## 2020-12-15 ENCOUNTER — Ambulatory Visit: Payer: PPO | Admitting: Cardiology

## 2020-12-15 ENCOUNTER — Encounter: Payer: Self-pay | Admitting: Cardiology

## 2020-12-15 VITALS — BP 138/68 | HR 56 | Ht 61.0 in | Wt 139.0 lb

## 2020-12-15 DIAGNOSIS — I10 Essential (primary) hypertension: Secondary | ICD-10-CM | POA: Diagnosis not present

## 2020-12-15 MED ORDER — METOPROLOL TARTRATE 25 MG PO TABS: 12.5000 mg | ORAL_TABLET | Freq: Two times a day (BID) | ORAL | 5 refills | Status: DC

## 2020-12-15 NOTE — Patient Instructions (Addendum)
Medication Instructions:   1.  DECREASE your Lopessor (metoprolol tartrate) to 12.5 MG twice daily.   Lab Work: None ordered If you have labs (blood work) drawn today and your tests are completely normal, you will receive your results only by: Marland Kitchen MyChart Message (if you have MyChart) OR . A paper copy in the mail If you have any lab test that is abnormal or we need to change your treatment, we will call you to review the results.   Testing/Procedures: None ordered   Follow-Up: At [facility], you and your health needs are our priority.  As part of our continuing mission to provide you with exceptional heart care, we have created designated Provider Care Teams.  These Care Teams include your primary Cardiologist (physician) and Advanced Practice Providers (APPs -  Physician Assistants and Nurse Practitioners) who all work together to provide you with the care you need, when you need it.  We recommend signing up for the patient portal called "MyChart".  Sign up information is provided on this After Visit Summary.  MyChart is used to connect with patients for Virtual Visits (Telemedicine).  Patients are able to view lab/test results, encounter notes, upcoming appointments, etc.  Non-urgent messages can be sent to your provider as well.   To learn more about what you can do with MyChart, go to [email or web address].    Your next appointment:   6 month(s)  The format for your next appointment:   In Person  Provider:   Kate Sable, MD   Other Instructions

## 2020-12-15 NOTE — Progress Notes (Signed)
Cardiology Office Note:    Date:  12/15/2020   ID:  Morgan Byrd, DOB 07/08/1936, MRN [id number]  PCP:  Leone Haven, MD  Cardiologist:  Kate Sable, MD  Electrophysiologist:  None   Referring MD: Leone Haven, MD   Chief Complaint  Patient presents with  . OTher    12 month follow up. Meds reviewed verbally with patient.     History of Present Illness:    Morgan Byrd is a 85 y.o. female with a hx of hypertension who presents as a follow-up.  Previously seen for elevated blood pressure and chest discomfort.  Symptoms improved after adequate BP management.  Irbesartan 150 mg daily was started.  Patient also takes HCTZ and Lopressor.  She is tolerating all blood pressure medications as prescribed, her blood pressures at home have been well controlled with systolics in the 382N.  Prior notes echocardiogram was ordered which patient performed at [facility] on 10/11/2019.  Report available in care everywhere, but briefly normal left ventricular systolic function, normal right ventricular systolic function,  EF was 55%, trivial MR, mild TR.   Past Medical History:  Diagnosis Date  . Anxiety   . Hypertension     Past Surgical History:  Procedure Laterality Date  . ABDOMINAL HYSTERECTOMY    . BLADDER SUSPENSION    . BREAST BIOPSY Right 1978   neg  . COLONOSCOPY WITH PROPOFOL N/A 12/26/2015   Procedure: COLONOSCOPY WITH PROPOFOL;  Surgeon: Manya Silvas, MD;  Location[facility] ENDOSCOPY;  Service: Endoscopy;  Laterality: N/A;  . NOSE SURGERY    . REPLACEMENT TOTAL KNEE BILATERAL    . SHOULDER SURGERY    . VAGINAL DELIVERY     2    Current Medications: Current Meds  Medication Sig  . ALLERGY RELIEF 180 MG tablet TAKE ONE TABLET EVERY DAY  . aspirin 81 MG EC tablet Take 81 mg by mouth daily. Swallow whole.  . B Complex-C (SUPER B COMPLEX PO) Take by mouth daily.   Marland Kitchen CALCIUM-MAGNESIUM-ZINC PO Take by mouth daily.   . cholecalciferol (VITAMIN D) 1000  UNITS tablet Take 1,000 Units by mouth daily.  . diclofenac (VOLTAREN) 75 MG EC tablet Take 75 mg by mouth 2 (two) times daily.  . fluticasone (FLONASE) 50 MCG/ACT nasal spray TAKE 2 SPRAYS INTO BOTH NOSTRILS DAILY  . FLUZONE HIGH-DOSE QUADRIVALENT 0.7 ML SUSY   . gabapentin (NEURONTIN) 300 MG capsule TAKE 1 CAPSULE BY MOUTH 3 TIMES DAILY  . hydrochlorothiazide (MICROZIDE) 12.5 MG capsule TAKE 1 CAPSULE EVERY DAY  . irbesartan (AVAPRO) 150 MG tablet Take 1 tablet (150 mg total) by mouth daily.  . methimazole (TAPAZOLE) 5 MG tablet Take 2.5 mg by mouth daily.  . montelukast (SINGULAIR) 10 MG tablet TAKE ONE TABLET BY MOUTH AT BEDTIME  . Multiple Vitamin (MULTIVITAMIN) capsule Take 1 capsule by mouth daily.  . [DISCONTINUED] metoprolol tartrate (LOPRESSOR) 25 MG tablet Take 1 tablet (25 mg total) by mouth 2 (two) times daily.     Allergies:   Codeine, Fosamax [alendronate sodium], Lexapro [escitalopram oxalate], and Spiriva respimat [tiotropium bromide monohydrate]   Social History   Socioeconomic History  . Marital status: Married    Spouse name: Not on file  . Number of children: Not on file  . Years of education: Not on file  . Highest education level: Not on file  Occupational History  . Not on file  Tobacco Use  . Smoking status: Former Smoker  Packs/day: 1.00    Years: 4.00    Pack years: 4.00    Types: Cigarettes    Quit date: 20    Years since quitting: 48.1  . Smokeless tobacco: Never Used  Vaping Use  . Vaping Use: Never used  Substance and Sexual Activity  . Alcohol use: No  . Drug use: No  . Sexual activity: Never  Other Topics Concern  . Not on file  Social History Narrative   Lives in [place] with husband. Son and daughter live nearby.      Work - retired Network engineer      Diet - healthy, regular   Exercise - housework   Social Determinants of Radio broadcast assistant Strain: Not on file  Food Insecurity: No Landscape architect  . Worried About Paediatric nurse in the Last Year: Never true  . Ran Out of Food in the Last Year: Never true  Transportation Needs: Not on file  Physical Activity: Not on file  Stress: Not on file  Social Connections: Not on file     Family History: The patient's family history includes Arthritis in her mother; Breast cancer (age of onset: 62) in her cousin; Breast cancer (age of onset: 41) in her sister; Cancer in her father and sister; Dementia in her mother; Diabetes in her father and sister; Heart disease in her father and sister; Hypertension in her father; Kidney disease in her son; Pneumonia in her son; Vasculitis in her son.  ROS:   Please see the history of present illness.     All other systems reviewed and are negative.  EKGs/Labs/Other Studies Reviewed:    The following studies were reviewed today: TTE 10-20-19 [employer]  INTERPRETATION ---------------------------------------------------------------   NORMAL LEFT VENTRICULAR SYSTOLIC FUNCTION   NORMAL RIGHT VENTRICULAR SYSTOLIC FUNCTION   VALVULAR REGURGITATION: TRIVIAL MR, MILD TR   NO VALVULAR STENOSIS   APICAL ROCKING AND CLASSICAL DYSSYNCHRONY PATTERN.   PVCS THROUGHOUT EXAM.  EKG:  EKG is  ordered today.  The ekg ordered today demonstrates sinus bradycardia, left bundle branch block.  Recent Labs: 11/18/2020: ALT 27; BUN 17; Creatinine, Ser 0.78; Potassium 4.7; Sodium 140  Recent Lipid Panel    Component Value Date/Time   CHOL 197 11/18/2020 1012   TRIG 72.0 11/18/2020 1012   HDL 69.00 11/18/2020 1012   CHOLHDL 3 11/18/2020 1012   VLDL 14.4 11/18/2020 1012   LDLCALC 114 (H) 11/18/2020 1012   LDLDIRECT 131.8 04/30/2013 0915    Physical Exam:    VS:  BP 138/68 (BP Location: Left Arm, Patient Position: Sitting, Cuff Size: Normal)   Pulse (!) 56   Ht 5\' 1"  (1.549 m)   Wt 139 lb (63 kg)   SpO2 95%   BMI 26.26 kg/m     Wt Readings from Last 3 Encounters:  12/15/20 139 lb (63 kg)  11/21/20 142 lb 6.4 oz (64.6 kg)   11/18/20 139 lb (63 kg)     GEN:  Well nourished, well developed in no acute distress HEENT: Normal NECK: No JVD; No carotid bruits LYMPHATICS: No lymphadenopathy CARDIAC: RRR, 6-0/1 systolic murmur at left sternal border, rubs, gallops RESPIRATORY:  Clear to auscultation without rales, wheezing or rhonchi  ABDOMEN: Soft, non-tender, non-distended MUSCULOSKELETAL:  No edema; No deformity  SKIN: Warm and dry NEUROLOGIC:  Alert and oriented x 3 PSYCHIATRIC:  Normal affect   ASSESSMENT:    1. Primary hypertension    PLAN:    In order of  problems listed above:  1. Hypertension, BP controlled, sinus bradycardia, heart rate 56 noted.  Decrease Lopressor to 12.5 mg twice daily, continue HCTZ and irbesartan.  Monitor BP and heart rate at home.  Plan to titrate off Lopressor if heart rate stays low.  Total encounter time more than 35 minutes  Greater than 50% was spent in counseling and coordination of care with the patient.   Follow-up in 6 months.  This note was generated in part or whole with voice recognition software. Voice recognition is usually quite accurate but there are transcription errors that can and very often do occur. I apologize for any typographical errors that were not detected and corrected.  Medication Adjustments/Labs and Tests Ordered: Current medicines are reviewed at length with the patient today.  Concerns regarding medicines are outlined above.  Orders Placed This Encounter  Procedures  . EKG 12-Lead   Meds ordered this encounter  Medications  . metoprolol tartrate (LOPRESSOR) 25 MG tablet    Sig: Take 0.5 tablets (12.5 mg total) by mouth 2 (two) times daily.    Dispense:  60 tablet    Refill:  5    Patient Instructions  Medication Instructions:   1.  DECREASE your Lopessor (metoprolol tartrate) to 12.5 MG twice daily.   Lab Work: None ordered If you have labs (blood work) drawn today and your tests are completely normal, you will receive your  results only by: Marland Kitchen MyChart Message (if you have MyChart) OR . A paper copy in the mail If you have any lab test that is abnormal or we need to change your treatment, we will call you to review the results.   Testing/Procedures: None ordered   Follow-Up: At [facility], you and your health needs are our priority.  As part of our continuing mission to provide you with exceptional heart care, we have created designated Provider Care Teams.  These Care Teams include your primary Cardiologist (physician) and Advanced Practice Providers (APPs -  Physician Assistants and Nurse Practitioners) who all work together to provide you with the care you need, when you need it.  We recommend signing up for the patient portal called "MyChart".  Sign up information is provided on this After Visit Summary.  MyChart is used to connect with patients for Virtual Visits (Telemedicine).  Patients are able to view lab/test results, encounter notes, upcoming appointments, etc.  Non-urgent messages can be sent to your provider as well.   To learn more about what you can do with MyChart, go to [email or web address].    Your next appointment:   6 month(s)  The format for your next appointment:   In Person  Provider:   Kate Sable, MD   Other Instructions      Signed, Kate Sable, MD  12/15/2020 1:02 PM    [facility]

## 2021-01-02 ENCOUNTER — Telehealth: Payer: Self-pay | Admitting: Family Medicine

## 2021-01-02 NOTE — Telephone Encounter (Signed)
Patient called in wanted some thing for anxeity

## 2021-01-02 NOTE — Telephone Encounter (Signed)
She will need an appointment to discuss options for treatment for this. She could see another provider if needed since I will not be in the office next week.

## 2021-01-02 NOTE — Telephone Encounter (Signed)
Patient called in wanted some thing for anxiety.  Last OV: 11/18/2020.  Arion Shankles,cma

## 2021-01-02 NOTE — Telephone Encounter (Signed)
Patient was called and she refused an appointment for medication.  Nichalas Coin,cma

## 2021-01-13 DIAGNOSIS — M5416 Radiculopathy, lumbar region: Secondary | ICD-10-CM | POA: Diagnosis not present

## 2021-01-14 ENCOUNTER — Other Ambulatory Visit: Payer: Self-pay

## 2021-01-16 ENCOUNTER — Other Ambulatory Visit: Payer: Self-pay

## 2021-01-16 ENCOUNTER — Encounter: Payer: Self-pay | Admitting: Family Medicine

## 2021-01-16 ENCOUNTER — Ambulatory Visit (INDEPENDENT_AMBULATORY_CARE_PROVIDER_SITE_OTHER): Payer: PPO | Admitting: Family Medicine

## 2021-01-16 ENCOUNTER — Other Ambulatory Visit: Payer: Self-pay | Admitting: *Deleted

## 2021-01-16 VITALS — BP 128/70 | HR 64 | Temp 98.4°F | Ht 61.0 in | Wt 140.4 lb

## 2021-01-16 DIAGNOSIS — F419 Anxiety disorder, unspecified: Secondary | ICD-10-CM | POA: Diagnosis not present

## 2021-01-16 DIAGNOSIS — R7989 Other specified abnormal findings of blood chemistry: Secondary | ICD-10-CM | POA: Diagnosis not present

## 2021-01-16 DIAGNOSIS — E041 Nontoxic single thyroid nodule: Secondary | ICD-10-CM

## 2021-01-16 DIAGNOSIS — I10 Essential (primary) hypertension: Secondary | ICD-10-CM

## 2021-01-16 DIAGNOSIS — E0590 Thyrotoxicosis, unspecified without thyrotoxic crisis or storm: Secondary | ICD-10-CM | POA: Diagnosis not present

## 2021-01-16 LAB — VITAMIN D 25 HYDROXY (VIT D DEFICIENCY, FRACTURES): VITD: 96.23 ng/mL (ref 30.00–100.00)

## 2021-01-16 MED ORDER — IRBESARTAN 150 MG PO TABS: 150.0000 mg | ORAL_TABLET | Freq: Every day | ORAL | 5 refills | Status: DC

## 2021-01-16 MED ORDER — SERTRALINE HCL 50 MG PO TABS: 50.0000 mg | ORAL_TABLET | Freq: Every day | ORAL | 3 refills | Status: DC

## 2021-01-16 NOTE — Assessment & Plan Note (Signed)
She will continue to see endocrinology. 

## 2021-01-16 NOTE — Patient Instructions (Signed)
Nice to see you. We will check your vitamin D today. I have started you on Zoloft.  If you notice any side effects we discussed please let us know.

## 2021-01-16 NOTE — Assessment & Plan Note (Signed)
Worsened recently.  She has a lot going on in her life.  Offered support.  We will start on Zoloft.  We will see her back in 6 weeks.  Discussed possible side effects of sleeping issues and weight gain.  If she notices those or other side effects she will let us know.

## 2021-01-16 NOTE — Progress Notes (Signed)
Tommi Rumps, MD Phone: [phone]  Morgan Byrd is a 85 y.o. female who presents today for f/u.  HYPERTENSION  Disease Monitoring  Home BP Monitoring not checking Chest pain- no    Dyspnea- no Medications  Compliance-  Taking HCTZ, irbesartan, metoprolol.   Edema- no  Anxiety: Patient has a lot going on with caring for her husband and also her son has been quite ill.  She notes quite a bit of anxiety regarding this.  No depression.  Not currently taking any medications for this.  She does have people in her life that she can talk to about this.  Elevated vitamin D: She is no longer on a supplement.  Needs vitamin D recheck.  Thyroid nodule/hyperthyroidism: Patient follows with endocrinology.  Currently on methimazole.  She follows up with them in the next month or 2.    Social History   Tobacco Use  Smoking Status Former Smoker  . Packs/day: 1.00  . Years: 4.00  . Pack years: 4.00  . Types: Cigarettes  . Quit date: 61  . Years since quitting: 48.2  Smokeless Tobacco Never Used    Current Outpatient Medications on File Prior to Visit  Medication Sig Dispense Refill  . ALLERGY RELIEF 180 MG tablet TAKE ONE TABLET EVERY DAY 90 tablet 1  . aspirin 81 MG EC tablet Take 81 mg by mouth daily. Swallow whole.    . B Complex-C (SUPER B COMPLEX PO) Take by mouth daily.     Marland Kitchen CALCIUM-MAGNESIUM-ZINC PO Take by mouth daily.     . cholecalciferol (VITAMIN D) 1000 UNITS tablet Take 1,000 Units by mouth daily.    . diclofenac (VOLTAREN) 75 MG EC tablet Take 75 mg by mouth 2 (two) times daily.    . fluticasone (FLONASE) 50 MCG/ACT nasal spray TAKE 2 SPRAYS INTO BOTH NOSTRILS DAILY 16 g 1  . FLUZONE HIGH-DOSE QUADRIVALENT 0.7 ML SUSY     . gabapentin (NEURONTIN) 300 MG capsule TAKE 1 CAPSULE BY MOUTH 3 TIMES DAILY 90 capsule 1  . hydrochlorothiazide (MICROZIDE) 12.5 MG capsule TAKE 1 CAPSULE EVERY DAY 90 capsule 3  . methimazole (TAPAZOLE) 5 MG tablet Take 2.5 mg by mouth daily.     . metoprolol tartrate (LOPRESSOR) 25 MG tablet Take 0.5 tablets (12.5 mg total) by mouth 2 (two) times daily. 60 tablet 5  . montelukast (SINGULAIR) 10 MG tablet TAKE ONE TABLET BY MOUTH AT BEDTIME 90 tablet 1  . Multiple Vitamin (MULTIVITAMIN) capsule Take 1 capsule by mouth daily.     No current facility-administered medications on file prior to visit.     ROS see history of present illness  Objective  Physical Exam Vitals:   01/16/21 1350  BP: 128/70  Pulse: 64  Temp: 98.4 F (36.9 C)    BP Readings from Last 3 Encounters:  01/16/21 128/70  12/15/20 138/68  11/21/20 (!) 142/78   Wt Readings from Last 3 Encounters:  01/16/21 140 lb 6.4 oz (63.7 kg)  12/15/20 139 lb (63 kg)  11/21/20 142 lb 6.4 oz (64.6 kg)    Physical Exam Constitutional:      General: She is not in acute distress.    Appearance: She is not diaphoretic.  Cardiovascular:     Rate and Rhythm: Normal rate and regular rhythm.     Heart sounds: Normal heart sounds.  Pulmonary:     Effort: Pulmonary effort is normal.     Breath sounds: Normal breath sounds.  Musculoskeletal:  Right lower leg: No edema.     Left lower leg: No edema.  Skin:    General: Skin is warm and dry.  Neurological:     Mental Status: She is alert.      Assessment/Plan: Please see individual problem list.  Problem List Items Addressed This Visit    Anxiety    Worsened recently.  She has a lot going on in her life.  Offered support.  We will start on Zoloft.  We will see her back in 6 weeks.  Discussed possible side effects of sleeping issues and weight gain.  If she notices those or other side effects she will let us know.      Relevant Medications   sertraline (ZOLOFT) 50 MG tablet   High serum vitamin D - Primary    Recheck vitamin D.  She will remain off of vitamin D supplementation.      Relevant Orders   Vitamin D (25 hydroxy)   Hypertension    Adequate control.  Continue HCTZ 12.5 mg once daily,  irbesartan 150 mg once daily, and metoprolol 25 mg twice daily.      Subclinical hyperthyroidism    She will continue to see endocrinology.      Thyroid nodule    She will continue to see endocrinology.         This visit occurred during the SARS-CoV-2 public health emergency.  Safety protocols were in place, including screening questions prior to the visit, additional usage of staff PPE, and extensive cleaning of exam room while observing appropriate contact time as indicated for disinfecting solutions.    Tommi Rumps, MD [place]

## 2021-01-16 NOTE — Assessment & Plan Note (Signed)
Adequate control.  Continue HCTZ 12.5 mg once daily, irbesartan 150 mg once daily, and metoprolol 25 mg twice daily.

## 2021-01-16 NOTE — Assessment & Plan Note (Signed)
Recheck vitamin D.  She will remain off of vitamin D supplementation.

## 2021-01-21 DIAGNOSIS — M48062 Spinal stenosis, lumbar region with neurogenic claudication: Secondary | ICD-10-CM | POA: Diagnosis not present

## 2021-01-21 DIAGNOSIS — M5416 Radiculopathy, lumbar region: Secondary | ICD-10-CM | POA: Diagnosis not present

## 2021-01-22 ENCOUNTER — Other Ambulatory Visit: Payer: Self-pay | Admitting: Family Medicine

## 2021-01-22 DIAGNOSIS — J309 Allergic rhinitis, unspecified: Secondary | ICD-10-CM

## 2021-02-13 DIAGNOSIS — M5416 Radiculopathy, lumbar region: Secondary | ICD-10-CM | POA: Diagnosis not present

## 2021-02-13 DIAGNOSIS — M48062 Spinal stenosis, lumbar region with neurogenic claudication: Secondary | ICD-10-CM | POA: Diagnosis not present

## 2021-02-27 ENCOUNTER — Other Ambulatory Visit: Payer: Self-pay

## 2021-02-27 ENCOUNTER — Ambulatory Visit (INDEPENDENT_AMBULATORY_CARE_PROVIDER_SITE_OTHER): Payer: PPO | Admitting: Family Medicine

## 2021-02-27 ENCOUNTER — Encounter: Payer: Self-pay | Admitting: Family Medicine

## 2021-02-27 DIAGNOSIS — M8000XD Age-related osteoporosis with current pathological fracture, unspecified site, subsequent encounter for fracture with routine healing: Secondary | ICD-10-CM | POA: Diagnosis not present

## 2021-02-27 DIAGNOSIS — F419 Anxiety disorder, unspecified: Secondary | ICD-10-CM | POA: Diagnosis not present

## 2021-02-27 NOTE — Assessment & Plan Note (Addendum)
Much improved.  She will continue Zoloft 50 mg once daily.  Discussed the potential for tapering off of this after 6 to 9 months of treatment.  I will see her back in about 6 months.  She will contact us for any worsening anxiety symptoms.

## 2021-02-27 NOTE — Patient Instructions (Signed)
Nice to see you. If you do not hear anything from our office regarding the Prolia please let us know.

## 2021-02-27 NOTE — Progress Notes (Signed)
Morgan Rumps, MD Phone: [phone]  Morgan Byrd is a 85 y.o. female who presents today for f/u.  Anxiety: Patient notes that Zoloft has been quite beneficial.  She notes no anxiety at this time.  She notes her husband has been able to tell a big difference as well.  No depression.  No side effects reported.  Osteoporosis: The patient notes that she never heard about the Prolia.  She is unable to remember to take the weekly bisphosphonate.   Social History   Tobacco Use  Smoking Status Former Smoker  . Packs/day: 1.00  . Years: 4.00  . Pack years: 4.00  . Types: Cigarettes  . Quit date: 68  . Years since quitting: 48.3  Smokeless Tobacco Never Used    Current Outpatient Medications on File Prior to Visit  Medication Sig Dispense Refill  . ALLERGY RELIEF 180 MG tablet TAKE ONE TABLET EVERY DAY 90 tablet 1  . aspirin 81 MG EC tablet Take 81 mg by mouth daily. Swallow whole.    . B Complex-C (SUPER B COMPLEX PO) Take by mouth daily.     Marland Kitchen CALCIUM-MAGNESIUM-ZINC PO Take by mouth daily.     . cholecalciferol (VITAMIN D) 1000 UNITS tablet Take 1,000 Units by mouth daily.    . diclofenac (VOLTAREN) 75 MG EC tablet Take 75 mg by mouth 2 (two) times daily.    . fluticasone (FLONASE) 50 MCG/ACT nasal spray TAKE 2 SPRAYS INTO BOTH NOSTRILS DAILY 16 g 1  . FLUZONE HIGH-DOSE QUADRIVALENT 0.7 ML SUSY     . gabapentin (NEURONTIN) 300 MG capsule TAKE 1 CAPSULE BY MOUTH 3 TIMES DAILY 90 capsule 1  . hydrochlorothiazide (MICROZIDE) 12.5 MG capsule TAKE 1 CAPSULE EVERY DAY 90 capsule 3  . irbesartan (AVAPRO) 150 MG tablet Take 1 tablet (150 mg total) by mouth daily. 30 tablet 5  . methimazole (TAPAZOLE) 5 MG tablet Take 2.5 mg by mouth daily.    . metoprolol tartrate (LOPRESSOR) 25 MG tablet Take 0.5 tablets (12.5 mg total) by mouth 2 (two) times daily. 60 tablet 5  . montelukast (SINGULAIR) 10 MG tablet TAKE ONE TABLET BY MOUTH AT BEDTIME 90 tablet 1  . Multiple Vitamin (MULTIVITAMIN)  capsule Take 1 capsule by mouth daily.    . sertraline (ZOLOFT) 50 MG tablet Take 1 tablet (50 mg total) by mouth daily. 30 tablet 3   No current facility-administered medications on file prior to visit.     ROS see history of present illness  Objective  Physical Exam There were no vitals filed for this visit.  BP Readings from Last 3 Encounters:  01/16/21 128/70  12/15/20 138/68  11/21/20 (!) 142/78   Wt Readings from Last 3 Encounters:  01/16/21 140 lb 6.4 oz (63.7 kg)  12/15/20 139 lb (63 kg)  11/21/20 142 lb 6.4 oz (64.6 kg)    Physical Exam Constitutional:      General: She is not in acute distress.    Appearance: She is not diaphoretic.  Cardiovascular:     Rate and Rhythm: Normal rate and regular rhythm.     Heart sounds: Normal heart sounds.  Pulmonary:     Effort: Pulmonary effort is normal.     Breath sounds: Normal breath sounds.  Skin:    General: Skin is warm and dry.  Neurological:     Mental Status: She is alert.      Assessment/Plan: Please see individual problem list.  Problem List Items Addressed This Visit  Osteoporosis    It does not look like approval for Prolia was pursued earlier this year.  I will send a note to our nurse to try to get this approved for the patient.  Her vitamin D has been elevated previously and that she does not need vitamin D supplementation.  She is on a calcium supplement.  She was advised of the risk of osteonecrosis of the jaw with Prolia.  Discussed this was relatively low risk and oftentimes only occurs with dental or jaw surgery.  She would let Morgan Byrd know if she were to have any dental work or jaw work while on the [employer].      Anxiety    Much improved.  She will continue Zoloft 50 mg once daily.  Discussed the potential for tapering off of this after 6 to 9 months of treatment.  I will see her back in about 6 months.  She will contact Morgan Byrd for any worsening anxiety symptoms.        This visit occurred during the  SARS-CoV-2 public health emergency.  Safety protocols were in place, including screening questions prior to the visit, additional usage of staff PPE, and extensive cleaning of exam room while observing appropriate contact time as indicated for disinfecting solutions.    Morgan Rumps, MD [place]

## 2021-02-27 NOTE — Assessment & Plan Note (Addendum)
It does not look like approval for Prolia was pursued earlier this year.  I will send a note to our nurse to try to get this approved for the patient.  Her vitamin D has been elevated previously and that she does not need vitamin D supplementation.  She is on a calcium supplement.  She was advised of the risk of osteonecrosis of the jaw with Prolia.  Discussed this was relatively low risk and oftentimes only occurs with dental or jaw surgery.  She would let us know if she were to have any dental work or jaw work while on the [employer].

## 2021-03-03 DIAGNOSIS — M5416 Radiculopathy, lumbar region: Secondary | ICD-10-CM | POA: Diagnosis not present

## 2021-03-03 DIAGNOSIS — M48062 Spinal stenosis, lumbar region with neurogenic claudication: Secondary | ICD-10-CM | POA: Diagnosis not present

## 2021-03-11 ENCOUNTER — Telehealth: Payer: Self-pay | Admitting: *Deleted

## 2021-03-11 DIAGNOSIS — E042 Nontoxic multinodular goiter: Secondary | ICD-10-CM | POA: Diagnosis not present

## 2021-03-11 DIAGNOSIS — E0590 Thyrotoxicosis, unspecified without thyrotoxic crisis or storm: Secondary | ICD-10-CM | POA: Diagnosis not present

## 2021-03-11 NOTE — Telephone Encounter (Signed)
Authorization for Prolia has been initated on [employer].

## 2021-03-11 NOTE — Telephone Encounter (Signed)
-----   Message from Leone Haven, MD sent at 02/27/2021  9:12 AM EDT ----- Can we try to get this patient approved for prolia? Do I need to place an order first? Thanks.   Randall Hiss

## 2021-03-17 DIAGNOSIS — D2261 Melanocytic nevi of right upper limb, including shoulder: Secondary | ICD-10-CM | POA: Diagnosis not present

## 2021-03-17 DIAGNOSIS — B078 Other viral warts: Secondary | ICD-10-CM | POA: Diagnosis not present

## 2021-03-17 DIAGNOSIS — D2271 Melanocytic nevi of right lower limb, including hip: Secondary | ICD-10-CM | POA: Diagnosis not present

## 2021-03-17 DIAGNOSIS — R238 Other skin changes: Secondary | ICD-10-CM | POA: Diagnosis not present

## 2021-03-17 DIAGNOSIS — D2272 Melanocytic nevi of left lower limb, including hip: Secondary | ICD-10-CM | POA: Diagnosis not present

## 2021-03-17 DIAGNOSIS — D044 Carcinoma in situ of skin of scalp and neck: Secondary | ICD-10-CM | POA: Diagnosis not present

## 2021-03-17 DIAGNOSIS — D2262 Melanocytic nevi of left upper limb, including shoulder: Secondary | ICD-10-CM | POA: Diagnosis not present

## 2021-03-17 DIAGNOSIS — D485 Neoplasm of uncertain behavior of skin: Secondary | ICD-10-CM | POA: Diagnosis not present

## 2021-03-17 DIAGNOSIS — L570 Actinic keratosis: Secondary | ICD-10-CM | POA: Diagnosis not present

## 2021-03-17 DIAGNOSIS — D225 Melanocytic nevi of trunk: Secondary | ICD-10-CM | POA: Diagnosis not present

## 2021-03-17 DIAGNOSIS — X32XXXA Exposure to sunlight, initial encounter: Secondary | ICD-10-CM | POA: Diagnosis not present

## 2021-03-18 DIAGNOSIS — M5136 Other intervertebral disc degeneration, lumbar region: Secondary | ICD-10-CM | POA: Diagnosis not present

## 2021-03-18 DIAGNOSIS — M48062 Spinal stenosis, lumbar region with neurogenic claudication: Secondary | ICD-10-CM | POA: Diagnosis not present

## 2021-03-18 DIAGNOSIS — E042 Nontoxic multinodular goiter: Secondary | ICD-10-CM | POA: Diagnosis not present

## 2021-03-18 DIAGNOSIS — M5416 Radiculopathy, lumbar region: Secondary | ICD-10-CM | POA: Diagnosis not present

## 2021-03-18 DIAGNOSIS — E0590 Thyrotoxicosis, unspecified without thyrotoxic crisis or storm: Secondary | ICD-10-CM | POA: Diagnosis not present

## 2021-03-27 ENCOUNTER — Other Ambulatory Visit: Payer: Self-pay | Admitting: Family Medicine

## 2021-03-27 DIAGNOSIS — J309 Allergic rhinitis, unspecified: Secondary | ICD-10-CM

## 2021-04-09 ENCOUNTER — Ambulatory Visit (INDEPENDENT_AMBULATORY_CARE_PROVIDER_SITE_OTHER): Payer: PPO

## 2021-04-09 VITALS — Ht 61.0 in | Wt 140.0 lb

## 2021-04-09 DIAGNOSIS — Z0000 Encounter for general adult medical examination without abnormal findings: Secondary | ICD-10-CM

## 2021-04-09 NOTE — Progress Notes (Signed)
Subjective:   Morgan Byrd is a 85 y.o. female who presents for Medicare Annual (Subsequent) preventive examination.  Review of Systems    No ROS.  Medicare Wellness Virtual Visit.  Visual/audio telehealth visit, UTA vital signs.   See social history for additional risk factors.   Cardiac Risk Factors include: advanced age (>29men, >19 women)     Objective:    Today's Vitals   04/09/21 1248  Weight: 140 lb (63.5 kg)  Height: 5\' 1"  (1.549 m)   Body mass index is 26.45 kg/m.  Advanced Directives 04/09/2021 07/29/2020 04/08/2020 11/21/2019 10/04/2019 09/08/2019 04/06/2019  Does Patient Have a Medical Advance Directive? Yes Yes Yes Yes No No Yes  Type of Paramedic of [place];Living will Living will;Healthcare Power of [place];Living will [place];Living will - - [place];Living will  Does patient want to make changes to medical advance directive? No - Patient declined Yes (Inpatient - patient defers changing a medical advance directive and declines information at this time) No - Patient declined No - Patient declined - - No - Patient declined  Copy of [place] in Chart? No - copy requested No - copy requested No - copy requested No - copy requested - - No - copy requested    Current Medications (verified) Outpatient Encounter Medications as of 04/09/2021  Medication Sig  . aspirin 81 MG EC tablet Take 81 mg by mouth daily. Swallow whole.  . B Complex-C (SUPER B COMPLEX PO) Take by mouth daily.   Marland Kitchen CALCIUM-MAGNESIUM-ZINC PO Take by mouth daily.   . cholecalciferol (VITAMIN D) 1000 UNITS tablet Take 1,000 Units by mouth daily.  . diclofenac (VOLTAREN) 75 MG EC tablet Take 75 mg by mouth 2 (two) times daily.  . fexofenadine (ALLEGRA) 180 MG tablet TAKE ONE TABLET EVERY DAY  . fluticasone (FLONASE) 50 MCG/ACT nasal spray TAKE 2 SPRAYS INTO BOTH NOSTRILS DAILY  . FLUZONE  HIGH-DOSE QUADRIVALENT 0.7 ML SUSY   . gabapentin (NEURONTIN) 300 MG capsule TAKE 1 CAPSULE BY MOUTH 3 TIMES DAILY  . hydrochlorothiazide (MICROZIDE) 12.5 MG capsule TAKE 1 CAPSULE EVERY DAY  . irbesartan (AVAPRO) 150 MG tablet Take 1 tablet (150 mg total) by mouth daily.  . methimazole (TAPAZOLE) 5 MG tablet Take 2.5 mg by mouth daily.  . metoprolol tartrate (LOPRESSOR) 25 MG tablet Take 0.5 tablets (12.5 mg total) by mouth 2 (two) times daily.  . montelukast (SINGULAIR) 10 MG tablet TAKE ONE TABLET BY MOUTH AT BEDTIME  . Multiple Vitamin (MULTIVITAMIN) capsule Take 1 capsule by mouth daily.  . sertraline (ZOLOFT) 50 MG tablet Take 1 tablet (50 mg total) by mouth daily.   No facility-administered encounter medications on file as of 04/09/2021.    Allergies (verified) Codeine, Fosamax [alendronate sodium], Lexapro [escitalopram oxalate], and Spiriva respimat [tiotropium bromide monohydrate]   History: Past Medical History:  Diagnosis Date  . Anxiety   . C2 cervical fracture ([place]) 11/14/2019  . Hypertension    Past Surgical History:  Procedure Laterality Date  . ABDOMINAL HYSTERECTOMY    . BLADDER SUSPENSION    . BREAST BIOPSY Right 1978   neg  . COLONOSCOPY WITH PROPOFOL N/A 12/26/2015   Procedure: COLONOSCOPY WITH PROPOFOL;  Surgeon: Manya Silvas, MD;  [facility]COPY;  Service: Endoscopy;  Laterality: N/A;  . NOSE SURGERY    . REPLACEMENT TOTAL KNEE BILATERAL    . SHOULDER SURGERY    .  VAGINAL DELIVERY     2   Family History  Problem Relation Age of Onset  . Dementia Mother   . Arthritis Mother   . Heart disease Father   . Hypertension Father   . Cancer Father        prostate  . Diabetes Father   . Diabetes Sister   . Cancer Sister        breast  . Breast cancer Sister 59  . Heart disease Sister   . Vasculitis Son   . Kidney disease Son   . Pneumonia Son   . Breast cancer Cousin 45   Social History   Socioeconomic History  . Marital status: Married     Spouse name: Not on file  . Number of children: Not on file  . Years of education: Not on file  . Highest education level: Not on file  Occupational History  . Not on file  Tobacco Use  . Smoking status: Former Smoker    Packs/day: 1.00    Years: 4.00    Pack years: 4.00    Types: Cigarettes    Quit date: 1974    Years since quitting: 48.4  . Smokeless tobacco: Never Used  Vaping Use  . Vaping Use: Never used  Substance and Sexual Activity  . Alcohol use: No  . Drug use: No  . Sexual activity: Never  Other Topics Concern  . Not on file  Social History Narrative   Lives in [place] with husband. Son and daughter live nearby.      Work - retired Network engineer      Diet - healthy, regular   Exercise - housework   Social Determinants of Radio broadcast assistant Strain: Low Risk   . Difficulty of Paying Living Expenses: Not hard at all  Food Insecurity: No Food Insecurity  . Worried About Charity fundraiser in the Last Year: Never true  . Ran Out of Food in the Last Year: Never true  Transportation Needs: No Transportation Needs  . Lack of Transportation (Medical): No  . Lack of Transportation (Non-Medical): No  Physical Activity: Insufficiently Active  . Days of Exercise per Week: 2 days  . Minutes of Exercise per Session: 60 min  Stress: No Stress Concern Present  . Feeling of Stress : Not at all  Social Connections: Unknown  . Frequency of Communication with Friends and Family: Not on file  . Frequency of Social Gatherings with Friends and Family: Not on file  . Attends Religious Services: Not on file  . Active Member of Clubs or Organizations: Not on file  . Attends Archivist Meetings: Not on file  . Marital Status: Married    Tobacco Counseling Counseling given: Not Answered   Clinical Intake:  Pre-visit preparation completed: Yes        Diabetes: No  How often do you need to have someone help you when you read instructions, pamphlets,  or other written materials from your doctor or pharmacy?: 1 - Never Interpreter Needed?: No      Activities of Daily Living In your present state of health, do you have any difficulty performing the following activities: 04/09/2021  Hearing? N  Vision? N  Difficulty concentrating or making decisions? N  Walking or climbing stairs? N  Dressing or bathing? N  Doing errands, shopping? N  Preparing Food and eating ? N  Using the Toilet? N  In the past six months, have you accidently leaked urine?  N  Do you have problems with loss of bowel control? N  Managing your Medications? N  Managing your Finances? N  Housekeeping or managing your Housekeeping? N  Some recent data might be hidden    Patient Care Team: Leone Haven, MD as PCP - General (Family Medicine) Kate Sable, MD as PCP - Cardiology (Cardiology)  Indicate any recent Medical Services you may have received from other than [facility] providers in the past year (date may be approximate).     Assessment:   This is a routine wellness examination for [place].  I connected with Clio today by telephone and verified that I am speaking with the correct person using two identifiers. Location patient: home Location provider: work Persons participating in the virtual visit: patient, Marine scientist.    I discussed the limitations, risks, security and privacy concerns of performing an evaluation and management service by telephone and the availability of in person appointments. The patient expressed understanding and verbally consented to this telephonic visit.    Interactive audio and video telecommunications were attempted between this provider and patient, however failed, due to patient having technical difficulties OR patient did not have access to video capability.  We continued and completed visit with audio only.  Some vital signs may be absent or patient reported.   Hearing/Vision screen  Hearing Screening   125Hz  250Hz   500Hz  1000Hz  2000Hz  3000Hz  4000Hz  6000Hz  8000Hz   Right ear:           Left ear:           Comments: Patient is able to hear conversational tones without difficulty.  No issues reported.  Vision Screening Comments: Visual acuity not assessed, virtual visit.       Dietary issues and exercise activities discussed: Current Exercise Habits: Home exercise routine, Intensity: Mild  Weight Watchers Good water intake  Goals Addressed   None    Depression Screen PHQ 2/9 Scores 04/09/2021 01/16/2021 04/08/2020 11/14/2019 07/25/2019 04/06/2019 11/13/2018  PHQ - 2 Score 0 0 0 0 0 0 0    Fall Risk Fall Risk  04/09/2021 01/16/2021 04/08/2020 11/14/2019 07/25/2019  Falls in the past year? 0 0 0 1 0  Number falls in past yr: 0 0 0 0 0  Injury with Fall? 0 - - 1 -  Follow up Falls evaluation completed - Falls evaluation completed Falls evaluation completed Falls evaluation completed    [place]: Handrails in use when climbing stairs? Yes Home free of loose throw rugs in walkways, pet beds, electrical cords, etc? Yes  Adequate lighting in your home to reduce risk of falls? Yes   ASSISTIVE DEVICES UTILIZED TO PREVENT FALLS: Life alert? No  Use of a cane, walker or w/c? No   TIMED UP AND GO: Was the test performed? No .   Cognitive Function: Patient is alert and oriented x3.  Denies difficulty focusing, making decisions, memory loss.  MMSE/6CIT deferred. Normal by direct communication/observation.   MMSE - Mini Mental State Exam 02/14/2018 02/10/2017  Orientation to time 5 5  Orientation to Place 5 5  Registration 3 3  Attention/ Calculation 5 5  Recall 2 3  Language- name 2 objects 2 2  Language- repeat 1 1  Language- follow 3 step command 3 3  Language- read & follow direction 1 1  Write a sentence 1 1  Copy design 1 1  Total score 29 30     6CIT Screen 04/08/2020 04/06/2019  What Year?  0 points 0 points  What month? 0 points 0 points  What time? 0 points 0  points  Count back from 20 0 points 0 points  Months in reverse 0 points 0 points  Repeat phrase 0 points 0 points  Total Score 0 0    Immunizations Immunization History  Administered Date(s) Administered  . Fluad Quad(high Dose 65+) 07/25/2019  . Influenza Nasal 08/04/2020  . Influenza, High Dose Seasonal PF 08/05/2016, 08/08/2017, 08/14/2018  . Influenza,inj,Quad PF,6+ Mos 08/24/2014, 08/05/2015  . Influenza-Unspecified 08/08/2012, 08/11/2013, 08/24/2014, 08/05/2015  . PFIZER(Purple Top)SARS-COV-2 Vaccination 11/14/2019, 12/05/2019, 08/07/2020  . Pneumococcal Conjugate-13 05/03/2014  . Pneumococcal Polysaccharide-23 04/25/2005  . Tdap 04/25/2012, 10/04/2019  . Zoster Recombinat (Shingrix) 08/08/2017, 02/06/2018  . Zoster, Live 06/26/2011   Health Maintenance Health Maintenance  Topic Date Due  . INFLUENZA VACCINE  06/08/2021  . TETANUS/TDAP  10/03/2029  . DEXA SCAN  Completed  . COVID-19 Vaccine  Completed  . PNA vac Low Risk Adult  Completed  . Zoster Vaccines- Shingrix  Completed  . HPV VACCINES  Aged Out   Colorectal cancer screening: No longer required.    Mammogram-plans to schedule later in the season.  Lung Cancer Screening: (Low Dose CT Chest recommended if Age 30-80 years, 30 pack-year currently smoking OR have quit w/in 15years.) does not qualify.   Hepatitis C Screening: does not qualify.   Vision Screening: Recommended annual ophthalmology exams for early detection of glaucoma and other disorders of the eye. Is the patient up to date with their annual eye exam?  Yes   Dental Screening: Recommended annual dental exams for proper oral hygiene. Visits every 6 months.   Community Resource Referral / Chronic Care Management: CRR required this visit?  No   CCM required this visit?  No      Plan:   Keep all routine maintenance appointments.   I have personally reviewed and noted the following in the patient's chart:   . Medical and social  history . Use of alcohol, tobacco or illicit drugs  . Current medications and supplements including opioid prescriptions. Patient is not currently taking opioid.  . Functional ability and status . Nutritional status . Physical activity . Advanced directives . List of other physicians . Hospitalizations, surgeries, and ER visits in previous 12 months . Vitals . Screenings to include cognitive, depression, and falls . Referrals and appointments  In addition, I have reviewed and discussed with patient certain preventive protocols, quality metrics, and best practice recommendations. A written personalized care plan for preventive services as well as general preventive health recommendations were provided to patient via mychart.     Varney Biles, LPN   06/09/1571

## 2021-04-09 NOTE — Patient Instructions (Addendum)
Morgan Byrd , Thank you for taking time to come for your Medicare Wellness Visit. I appreciate your ongoing commitment to your health goals. Please review the following plan we discussed and let me know if I can assist you in the future.   These are the goals we discussed: Goals      Patient Stated   .  Follow up with Provider as needed (pt-stated)       This is a list of the screening recommended for you and due dates:  Health Maintenance  Topic Date Due  . Flu Shot  06/08/2021  . Tetanus Vaccine  10/03/2029  . DEXA scan (bone density measurement)  Completed  . COVID-19 Vaccine  Completed  . Pneumonia vaccines  Completed  . Zoster (Shingles) Vaccine  Completed  . HPV Vaccine  Aged Out   Advanced directives: End of life planning; Advance aging; Advanced directives discussed.  Copy of current HCPOA/Living Will requested.    Conditions/risks identified: none new  Next appointment: Follow up in one year for your annual wellness visit    Preventive Care 65 Years and Older, Female Preventive care refers to lifestyle choices and visits with your health care provider that can promote health and wellness. What does preventive care include?  A yearly physical exam. This is also called an annual well check.  Dental exams once or twice a year.  Routine eye exams. Ask your health care provider how often you should have your eyes checked.  Personal lifestyle choices, including:  Daily care of your teeth and gums.  Regular physical activity.  Eating a healthy diet.  Avoiding tobacco and drug use.  Limiting alcohol use.  Practicing safe sex.  Taking low-dose aspirin every day.  Taking vitamin and mineral supplements as recommended by your health care provider. What happens during an annual well check? The services and screenings done by your health care provider during your annual well check will depend on your age, overall health, lifestyle risk factors, and family history  of disease. Counseling  Your health care provider may ask you questions about your:  Alcohol use.  Tobacco use.  Drug use.  Emotional well-being.  Home and relationship well-being.  Sexual activity.  Eating habits.  History of falls.  Memory and ability to understand (cognition).  Work and work Statistician.  Reproductive health. Screening  You may have the following tests or measurements:  Height, weight, and BMI.  Blood pressure.  Lipid and cholesterol levels. These may be checked every 5 years, or more frequently if you are over 83 years old.  Skin check.  Lung cancer screening. You may have this screening every year starting at age 83 if you have a 30-pack-year history of smoking and currently smoke or have quit within the past 15 years.  Fecal occult blood test (FOBT) of the stool. You may have this test every year starting at age 83.  Flexible sigmoidoscopy or colonoscopy. You may have a sigmoidoscopy every 5 years or a colonoscopy every 10 years starting at age 83.  Hepatitis C blood test.  Hepatitis B blood test.  Sexually transmitted disease (STD) testing.  Diabetes screening. This is done by checking your blood sugar (glucose) after you have not eaten for a while (fasting). You may have this done every 1-3 years.  Bone density scan. This is done to screen for osteoporosis. You may have this done starting at age 83.  Mammogram. This may be done every 1-2 years. Talk to your health  care provider about how often you should have regular mammograms. Talk with your health care provider about your test results, treatment options, and if necessary, the need for more tests. Vaccines  Your health care provider may recommend certain vaccines, such as:  Influenza vaccine. This is recommended every year.  Tetanus, diphtheria, and acellular pertussis (Tdap, Td) vaccine. You may need a Td booster every 10 years.  Zoster vaccine. You may need this after age  83.  Pneumococcal 13-valent conjugate (PCV13) vaccine. One dose is recommended after age 83.  Pneumococcal polysaccharide (PPSV23) vaccine. One dose is recommended after age 83. Talk to your health care provider about which screenings and vaccines you need and how often you need them. This information is not intended to replace advice given to you by your health care provider. Make sure you discuss any questions you have with your health care provider. Document Released: 11/21/2015 Document Revised: 07/14/2016 Document Reviewed: 08/26/2015 Elsevier Interactive Patient Education  2017 [place] Prevention in the Home Falls can cause injuries. They can happen to people of all ages. There are many things you can do to make your home safe and to help prevent falls. What can I do on the outside of my home?  Regularly fix the edges of walkways and driveways and fix any cracks.  Remove anything that might make you trip as you walk through a door, such as a raised step or threshold.  Trim any bushes or trees on the path to your home.  Use bright outdoor lighting.  Clear any walking paths of anything that might make someone trip, such as rocks or tools.  Regularly check to see if handrails are loose or broken. Make sure that both sides of any steps have handrails.  Any raised decks and porches should have guardrails on the edges.  Have any leaves, snow, or ice cleared regularly.  Use sand or salt on walking paths during winter.  Clean up any spills in your garage right away. This includes oil or grease spills. What can I do in the bathroom?  Use night lights.  Install grab bars by the toilet and in the tub and shower. Do not use towel bars as grab bars.  Use non-skid mats or decals in the tub or shower.  If you need to sit Byrd in the shower, use a plastic, non-slip stool.  Keep the floor dry. Clean up any water that spills on the floor as soon as it happens.  Remove  soap buildup in the tub or shower regularly.  Attach bath mats securely with double-sided non-slip rug tape.  Do not have throw rugs and other things on the floor that can make you trip. What can I do in the bedroom?  Use night lights.  Make sure that you have a light by your bed that is easy to reach.  Do not use any sheets or blankets that are too big for your bed. They should not hang Byrd onto the floor.  Have a firm chair that has side arms. You can use this for support while you get dressed.  Do not have throw rugs and other things on the floor that can make you trip. What can I do in the kitchen?  Clean up any spills right away.  Avoid walking on wet floors.  Keep items that you use a lot in easy-to-reach places.  If you need to reach something above you, use a strong step stool that has a grab  bar.  Keep electrical cords out of the way.  Do not use floor polish or wax that makes floors slippery. If you must use wax, use non-skid floor wax.  Do not have throw rugs and other things on the floor that can make you trip. What can I do with my stairs?  Do not leave any items on the stairs.  Make sure that there are handrails on both sides of the stairs and use them. Fix handrails that are broken or loose. Make sure that handrails are as long as the stairways.  Check any carpeting to make sure that it is firmly attached to the stairs. Fix any carpet that is loose or worn.  Avoid having throw rugs at the top or bottom of the stairs. If you do have throw rugs, attach them to the floor with carpet tape.  Make sure that you have a light switch at the top of the stairs and the bottom of the stairs. If you do not have them, ask someone to add them for you. What else can I do to help prevent falls?  Wear shoes that:  Do not have high heels.  Have rubber bottoms.  Are comfortable and fit you well.  Are closed at the toe. Do not wear sandals.  If you use a  stepladder:  Make sure that it is fully opened. Do not climb a closed stepladder.  Make sure that both sides of the stepladder are locked into place.  Ask someone to hold it for you, if possible.  Clearly mark and make sure that you can see:  Any grab bars or handrails.  First and last steps.  Where the edge of each step is.  Use tools that help you move around (mobility aids) if they are needed. These include:  Canes.  Walkers.  Scooters.  Crutches.  Turn on the lights when you go into a dark area. Replace any light bulbs as soon as they burn out.  Set up your furniture so you have a clear path. Avoid moving your furniture around.  If any of your floors are uneven, fix them.  If there are any pets around you, be aware of where they are.  Review your medicines with your doctor. Some medicines can make you feel dizzy. This can increase your chance of falling. Ask your doctor what other things that you can do to help prevent falls. This information is not intended to replace advice given to you by your health care provider. Make sure you discuss any questions you have with your health care provider. Document Released: 08/21/2009 Document Revised: 04/01/2016 Document Reviewed: 11/29/2014 Elsevier Interactive Patient Education  2017 [employer].

## 2021-04-14 NOTE — Telephone Encounter (Signed)
It is ok to schedule this.

## 2021-04-14 NOTE — Telephone Encounter (Signed)
Prolia approved  First Injection at Office visit on 05/18/21 okay?

## 2021-04-15 NOTE — Telephone Encounter (Signed)
Patient scheduled.

## 2021-04-16 DIAGNOSIS — D044 Carcinoma in situ of skin of scalp and neck: Secondary | ICD-10-CM | POA: Diagnosis not present

## 2021-04-23 DIAGNOSIS — H43811 Vitreous degeneration, right eye: Secondary | ICD-10-CM | POA: Diagnosis not present

## 2021-05-12 ENCOUNTER — Other Ambulatory Visit: Payer: Self-pay | Admitting: Family Medicine

## 2021-05-18 ENCOUNTER — Encounter: Payer: Self-pay | Admitting: Family Medicine

## 2021-05-18 ENCOUNTER — Other Ambulatory Visit: Payer: Self-pay

## 2021-05-18 ENCOUNTER — Ambulatory Visit (INDEPENDENT_AMBULATORY_CARE_PROVIDER_SITE_OTHER): Payer: PPO | Admitting: Family Medicine

## 2021-05-18 VITALS — BP 120/70 | HR 60 | Temp 98.0°F | Ht 61.0 in | Wt 138.0 lb

## 2021-05-18 DIAGNOSIS — R1310 Dysphagia, unspecified: Secondary | ICD-10-CM

## 2021-05-18 DIAGNOSIS — I10 Essential (primary) hypertension: Secondary | ICD-10-CM

## 2021-05-18 DIAGNOSIS — R911 Solitary pulmonary nodule: Secondary | ICD-10-CM | POA: Diagnosis not present

## 2021-05-18 DIAGNOSIS — G629 Polyneuropathy, unspecified: Secondary | ICD-10-CM | POA: Diagnosis not present

## 2021-05-18 DIAGNOSIS — M810 Age-related osteoporosis without current pathological fracture: Secondary | ICD-10-CM

## 2021-05-18 DIAGNOSIS — M170 Bilateral primary osteoarthritis of knee: Secondary | ICD-10-CM | POA: Diagnosis not present

## 2021-05-18 LAB — BASIC METABOLIC PANEL
BUN: 15 mg/dL (ref 6–23)
CO2: 33 mEq/L — ABNORMAL HIGH (ref 19–32)
Calcium: 9.9 mg/dL (ref 8.4–10.5)
Chloride: 100 mEq/L (ref 96–112)
Creatinine, Ser: 0.81 mg/dL (ref 0.40–1.20)
GFR: 67.53 mL/min (ref >60.00)
Glucose, Bld: 91 mg/dL (ref 70–99)
Potassium: 4.5 mEq/L (ref 3.5–5.1)
Sodium: 141 mEq/L (ref 135–145)

## 2021-05-18 MED ORDER — GABAPENTIN 300 MG PO CAPS: 300.0000 mg | ORAL_CAPSULE | Freq: Three times a day (TID) | ORAL | 5 refills | Status: DC

## 2021-05-18 MED ORDER — DICLOFENAC SODIUM 75 MG PO TBEC: 75.0000 mg | DELAYED_RELEASE_TABLET | Freq: Two times a day (BID) | ORAL | 1 refills | Status: DC | PRN

## 2021-05-18 MED ORDER — DENOSUMAB 60 MG/ML ~~LOC~~ SOSY
60.0000 mg | PREFILLED_SYRINGE | Freq: Once | SUBCUTANEOUS | Status: AC
  Administered 2021-05-18: 60 mg via SUBCUTANEOUS

## 2021-05-18 NOTE — Progress Notes (Signed)
Tommi Rumps, MD Phone: [phone]  Morgan Byrd is a 85 y.o. female who presents today for follow-up.  Osteoporosis: Patient is due for her first Prolia injection.  She is no longer on vitamin D.  Her vitamin D level was previously elevated.  She is taking calcium.  Hypertension: Patient reports her blood pressure is good though she only occasionally checks it.  She is taking HCTZ 12.5 mg once daily, irbesartan 150 mg daily, and metoprolol 12.5 mg twice daily.  No chest pain, shortness of breath, or edema.  History of dysphagia: Patient notes no reflux symptoms.  No dysphagia.  She is no longer on omeprazole as she has not been having any symptoms with diet control.  No abdominal pain.  No blood in her stool.  Neuropathy: She continues on gabapentin.  She tried to come off of this though noticed that it made a big difference for her neuropathy.  She notes it does not make her drowsy.  She takes 1 capsule 3 times a day.  Arthritis: Patient has been on diclofenac for this.  She takes this about once a day with food.  Notes that has helped her leg and hip discomfort.  Social History   Tobacco Use  Smoking Status Former   Packs/day: 1.00   Years: 4.00   Pack years: 4.00   Types: Cigarettes   Quit date: 1974   Years since quitting: 48.5  Smokeless Tobacco Never    Current Outpatient Medications on File Prior to Visit  Medication Sig Dispense Refill   aspirin 81 MG EC tablet Take 81 mg by mouth daily. Swallow whole.     B Complex-C (SUPER B COMPLEX PO) Take by mouth daily.      CALCIUM-MAGNESIUM-ZINC PO Take by mouth daily.      fexofenadine (ALLEGRA) 180 MG tablet TAKE ONE TABLET EVERY DAY 90 tablet 1   fluticasone (FLONASE) 50 MCG/ACT nasal spray TAKE 2 SPRAYS INTO BOTH NOSTRILS DAILY 16 g 1   FLUZONE HIGH-DOSE QUADRIVALENT 0.7 ML SUSY      hydrochlorothiazide (MICROZIDE) 12.5 MG capsule TAKE 1 CAPSULE EVERY DAY 90 capsule 3   irbesartan (AVAPRO) 150 MG tablet Take 1 tablet  (150 mg total) by mouth daily. 30 tablet 5   methimazole (TAPAZOLE) 5 MG tablet Take 2.5 mg by mouth daily.     metoprolol tartrate (LOPRESSOR) 25 MG tablet Take 0.5 tablets (12.5 mg total) by mouth 2 (two) times daily. 60 tablet 5   montelukast (SINGULAIR) 10 MG tablet TAKE ONE TABLET BY MOUTH AT BEDTIME 90 tablet 1   Multiple Vitamin (MULTIVITAMIN) capsule Take 1 capsule by mouth daily.     sertraline (ZOLOFT) 50 MG tablet TAKE 1 TABLET BY MOUTH DAILY 30 tablet 3   cholecalciferol (VITAMIN D) 1000 UNITS tablet Take 1,000 Units by mouth daily. (Patient not taking: Reported on 05/18/2021)     No current facility-administered medications on file prior to visit.     ROS see history of present illness  Objective  Physical Exam Vitals:   05/18/21 0952  BP: 120/70  Pulse: 60  Temp: 98 F (36.7 C)  SpO2: 99%    BP Readings from Last 3 Encounters:  05/18/21 120/70  01/16/21 128/70  12/15/20 138/68   Wt Readings from Last 3 Encounters:  05/18/21 138 lb (62.6 kg)  04/09/21 140 lb (63.5 kg)  01/16/21 140 lb 6.4 oz (63.7 kg)    Physical Exam Constitutional:      General: She is not  in acute distress.    Appearance: She is not diaphoretic.  Cardiovascular:     Rate and Rhythm: Normal rate and regular rhythm.     Heart sounds: Normal heart sounds.  Pulmonary:     Effort: Pulmonary effort is normal.     Breath sounds: Normal breath sounds.  Musculoskeletal:     Right lower leg: No edema.     Left lower leg: No edema.  Skin:    General: Skin is warm and dry.  Neurological:     Mental Status: She is alert.     Assessment/Plan: Please see individual problem list.  Problem List Items Addressed This Visit     Degenerative arthritis of knee, bilateral    Chronic issue adequately controlled with diclofenac once daily.  I advised she could continue this.  We will monitor her kidney function.  She will take this with food and if it upsets her stomach she will let us know.        Relevant Medications   diclofenac (VOLTAREN) 75 MG EC tablet   Dysphagia    Asymptomatic with diet control.  She will monitor for recurrence of any symptoms and let us know right away if they do recur.       Hypertension - Primary    Adequate control.  She will continue HCTZ, irbesartan, and metoprolol.  Check BMP today.       Relevant Orders   Basic Metabolic Panel (BMET)   Lung nodule    Found to be benign on follow-up imaging.        Neuropathy    Adequate control with gabapentin 300 mg 3 times daily.  She will continue this regimen.  Prior B12 level was normal.       Relevant Medications   gabapentin (NEURONTIN) 300 MG capsule   Osteoporosis    The patient was given her first dose of Prolia today.  She will monitor for any aching.  I did discuss the risk of osteonecrosis of the jaw with her and advised her to inform her dentist that she would be on Prolia.        Return in about 6 months (around 11/18/2021) for PCP follow-up, prolia.  This visit occurred during the SARS-CoV-2 public health emergency.  Safety protocols were in place, including screening questions prior to the visit, additional usage of staff PPE, and extensive cleaning of exam room while observing appropriate contact time as indicated for disinfecting solutions.    Tommi Rumps, MD [place]

## 2021-05-18 NOTE — Assessment & Plan Note (Signed)
The patient was given her first dose of Prolia today.  She will monitor for any aching.  I did discuss the risk of osteonecrosis of the jaw with her and advised her to inform her dentist that she would be on Prolia.

## 2021-05-18 NOTE — Assessment & Plan Note (Addendum)
Adequate control with gabapentin 300 mg 3 times daily.  She will continue this regimen.  Prior B12 level was normal.

## 2021-05-18 NOTE — Assessment & Plan Note (Signed)
Adequate control.  She will continue HCTZ, irbesartan, and metoprolol.  Check BMP today.

## 2021-05-18 NOTE — Assessment & Plan Note (Signed)
Found to be benign on follow-up imaging.

## 2021-05-18 NOTE — Patient Instructions (Signed)
Nice to see you. We will get lab work today. Please always take your diclofenac with food.  If this starts to upset your stomach please discontinue it and let us know.

## 2021-05-18 NOTE — Assessment & Plan Note (Signed)
Asymptomatic with diet control.  She will monitor for recurrence of any symptoms and let us know right away if they do recur.

## 2021-05-18 NOTE — Progress Notes (Signed)
Morgan Byrd presents today for injection per MD orders. Prolia  injection administered SQ in left Upper Arm. Administration without incident. Patient tolerated well.  Veronnica Hennings,cma

## 2021-05-18 NOTE — Assessment & Plan Note (Signed)
Chronic issue adequately controlled with diclofenac once daily.  I advised she could continue this.  We will monitor her kidney function.  She will take this with food and if it upsets her stomach she will let us know.

## 2021-06-16 DIAGNOSIS — M5416 Radiculopathy, lumbar region: Secondary | ICD-10-CM | POA: Diagnosis not present

## 2021-06-18 ENCOUNTER — Other Ambulatory Visit: Payer: Self-pay

## 2021-06-18 ENCOUNTER — Encounter: Payer: Self-pay | Admitting: Cardiology

## 2021-06-18 ENCOUNTER — Ambulatory Visit: Payer: PPO | Admitting: Cardiology

## 2021-06-18 VITALS — BP 134/60 | HR 56 | Ht 61.5 in | Wt 140.0 lb

## 2021-06-18 DIAGNOSIS — I10 Essential (primary) hypertension: Secondary | ICD-10-CM | POA: Diagnosis not present

## 2021-06-18 MED ORDER — METOPROLOL SUCCINATE ER 25 MG PO TB24: 25.0000 mg | ORAL_TABLET | Freq: Every day | ORAL | 5 refills | Status: DC

## 2021-06-18 NOTE — Progress Notes (Signed)
Cardiology Office Note:    Date:  06/18/2021   ID:  Morgan Byrd, DOB 04-Aug-1936, MRN [id number]  PCP:  Leone Haven, MD  Cardiologist:  Kate Sable, MD  Electrophysiologist:  None   Referring MD: Leone Haven, MD   Chief Complaint  Patient presents with   Other    6 month follow up. Meds reviewed verbally with patient.     History of Present Illness:    Morgan Byrd is a 85 y.o. female with a hx of hypertension who presents as a follow-up.  Initially seen for elevated blood pressure and chest discomfort.  Symptoms improved after adequate BP management.  Bradycardia was noted, Lopressor was decreased.  Feels well, blood pressures have been controlled on current medications including irbesartan, HCTZ, Lopressor.  Feels well, has no concerns at this time.  Her husband has Parkinson's and patient sometimes feels overwhelmed from days with occasional anxiety symptoms.  Takes Zoloft for this.  Prior notes echocardiogram was ordered which patient performed at [facility] on 10/11/2019.  Report available in care everywhere, but briefly normal left ventricular systolic function, normal right ventricular systolic function,  EF was 55%, trivial MR, mild TR.   Past Medical History:  Diagnosis Date   Anxiety    C2 cervical fracture ([place]) 11/14/2019   History of closed fracture of nasal bones 10/11/2019   Hypertension    Nasal fracture 11/14/2019    Past Surgical History:  Procedure Laterality Date   ABDOMINAL HYSTERECTOMY     BLADDER SUSPENSION     BREAST BIOPSY Right 1978   neg   COLONOSCOPY WITH PROPOFOL N/A 12/26/2015   Procedure: COLONOSCOPY WITH PROPOFOL;  Surgeon: Manya Silvas, MD;  Location: [facility] ENDOSCOPY;  Service: Endoscopy;  Laterality: N/A;   NOSE SURGERY     REPLACEMENT TOTAL KNEE BILATERAL     SHOULDER SURGERY     VAGINAL DELIVERY     2    Current Medications: Current Meds  Medication Sig   aspirin 81 MG EC tablet Take 81 mg by mouth  daily. Swallow whole.   B Complex-C (SUPER B COMPLEX PO) Take by mouth daily.    CALCIUM-MAGNESIUM-ZINC PO Take by mouth daily.    cholecalciferol (VITAMIN D) 1000 UNITS tablet Take 1,000 Units by mouth daily.   diclofenac (VOLTAREN) 75 MG EC tablet Take 1 tablet (75 mg total) by mouth 2 (two) times daily as needed for moderate pain.   fexofenadine (ALLEGRA) 180 MG tablet TAKE ONE TABLET EVERY DAY   fluticasone (FLONASE) 50 MCG/ACT nasal spray TAKE 2 SPRAYS INTO BOTH NOSTRILS DAILY   FLUZONE HIGH-DOSE QUADRIVALENT 0.7 ML SUSY    gabapentin (NEURONTIN) 300 MG capsule Take 1 capsule (300 mg total) by mouth 3 (three) times daily.   hydrochlorothiazide (MICROZIDE) 12.5 MG capsule TAKE 1 CAPSULE EVERY DAY   irbesartan (AVAPRO) 150 MG tablet Take 1 tablet (150 mg total) by mouth daily.   methimazole (TAPAZOLE) 5 MG tablet Take 2.5 mg by mouth daily.   metoprolol succinate (TOPROL XL) 25 MG 24 hr tablet Take 1 tablet (25 mg total) by mouth daily.   montelukast (SINGULAIR) 10 MG tablet TAKE ONE TABLET BY MOUTH AT BEDTIME   Multiple Vitamin (MULTIVITAMIN) capsule Take 1 capsule by mouth daily.   sertraline (ZOLOFT) 50 MG tablet TAKE 1 TABLET BY MOUTH DAILY   [DISCONTINUED] metoprolol tartrate (LOPRESSOR) 25 MG tablet Take 0.5 tablets (12.5 mg total) by mouth 2 (two) times daily.  Allergies:   Codeine, Fosamax [alendronate sodium], Lexapro [escitalopram oxalate], and Spiriva respimat [tiotropium bromide monohydrate]   Social History   Socioeconomic History   Marital status: Married    Spouse name: Not on file   Number of children: Not on file   Years of education: Not on file   Highest education level: Not on file  Occupational History   Not on file  Tobacco Use   Smoking status: Former    Packs/day: 1.00    Years: 4.00    Pack years: 4.00    Types: Cigarettes    Quit date: 61    Years since quitting: 48.6   Smokeless tobacco: Never  Vaping Use   Vaping Use: Never used  Substance  and Sexual Activity   Alcohol use: No   Drug use: No   Sexual activity: Never  Other Topics Concern   Not on file  Social History Narrative   Lives in [place] with husband. Son and daughter live nearby.      Work - retired Network engineer      Diet - healthy, regular   Exercise - housework   Social Determinants of Radio broadcast assistant Strain: Low Risk    Difficulty of Paying Living Expenses: Not hard at all  Food Insecurity: No Food Insecurity   Worried About Charity fundraiser in the Last Year: Never true   Arboriculturist in the Last Year: Never true  Transportation Needs: No Transportation Needs   Lack of Transportation (Medical): No   Lack of Transportation (Non-Medical): No  Physical Activity: Insufficiently Active   Days of Exercise per Week: 2 days   Minutes of Exercise per Session: 60 min  Stress: No Stress Concern Present   Feeling of Stress : Not at all  Social Connections: Unknown   Frequency of Communication with Friends and Family: Not on file   Frequency of Social Gatherings with Friends and Family: Not on file   Attends Religious Services: Not on Electrical engineer or Organizations: Not on file   Attends Archivist Meetings: Not on file   Marital Status: Married     Family History: The patient's family history includes Arthritis in her mother; Breast cancer (age of onset: 34) in her cousin; Breast cancer (age of onset: 51) in her sister; Cancer in her father and sister; Dementia in her mother; Diabetes in her father and sister; Heart disease in her father and sister; Hypertension in her father; Kidney disease in her son; Pneumonia in her son; Vasculitis in her son.  ROS:   Please see the history of present illness.     All other systems reviewed and are negative.  EKGs/Labs/Other Studies Reviewed:    The following studies were reviewed today: TTE 11/05/2019 [facility]  INTERPRETATION  ---------------------------------------------------------------   NORMAL LEFT VENTRICULAR SYSTOLIC FUNCTION   NORMAL RIGHT VENTRICULAR SYSTOLIC FUNCTION   VALVULAR REGURGITATION: TRIVIAL MR, MILD TR   NO VALVULAR STENOSIS   APICAL ROCKING AND CLASSICAL DYSSYNCHRONY PATTERN.   PVCS THROUGHOUT EXAM.  EKG:  EKG is  ordered today.  The ekg ordered today demonstrates sinus bradycardia, left bundle branch block.  Recent Labs: 11/18/2020: ALT 27 05/18/2021: BUN 15; Creatinine, Ser 0.81; Potassium 4.5; Sodium 141  Recent Lipid Panel    Component Value Date/Time   CHOL 197 11/18/2020 1012   TRIG 72.0 11/18/2020 1012   HDL 69.00 11/18/2020 1012   CHOLHDL 3 11/18/2020 1012  VLDL 14.4 11/18/2020 1012   LDLCALC 114 (H) 11/18/2020 1012   LDLDIRECT 131.8 04/30/2013 0915    Physical Exam:    VS:  BP 134/60 (BP Location: Left Arm, Patient Position: Sitting, Cuff Size: Normal)   Pulse (!) 56   Ht 5' 1.5" (1.562 m)   Wt 140 lb (63.5 kg)   SpO2 94%   BMI 26.02 kg/m     Wt Readings from Last 3 Encounters:  06/18/21 140 lb (63.5 kg)  05/18/21 138 lb (62.6 kg)  04/09/21 140 lb (63.5 kg)     GEN:  Well nourished, well developed in no acute distress HEENT: Normal NECK: No JVD; No carotid bruits LYMPHATICS: No lymphadenopathy CARDIAC: RRR, [id number] systolic murmur at left sternal border, rubs, gallops RESPIRATORY:  Clear to auscultation without rales, wheezing or rhonchi  ABDOMEN: Soft, non-tender, non-distended MUSCULOSKELETAL:  No edema; No deformity  SKIN: Warm and dry NEUROLOGIC:  Alert and oriented x 3 PSYCHIATRIC:  Normal affect   ASSESSMENT:    1. Primary hypertension     PLAN:    In order of problems listed above:  Hypertension, BP controlled, sinus bradycardia, heart rate 56 noted.  Stop Lopressor, start Toprol-XL 25 mg daily.  I think a low-dose beta-blocker will be beneficial with patient's documented history of anxiety, continue HCTZ and irbesartan.    Total encounter  time more than 35 minutes  Greater than 50% was spent in counseling and coordination of care with the patient.   Follow-up in 12 months.  This note was generated in part or whole with voice recognition software. Voice recognition is usually quite accurate but there are transcription errors that can and very often do occur. I apologize for any typographical errors that were not detected and corrected.  Medication Adjustments/Labs and Tests Ordered: Current medicines are reviewed at length with the patient today.  Concerns regarding medicines are outlined above.  Orders Placed This Encounter  Procedures   EKG 12-Lead    Meds ordered this encounter  Medications   metoprolol succinate (TOPROL XL) 25 MG 24 hr tablet    Sig: Take 1 tablet (25 mg total) by mouth daily.    Dispense:  30 tablet    Refill:  5     Patient Instructions  Medication Instructions:   Your physician has recommended you make the following change in your medication:    STOP taking Lopressor (Metoprolol Tartrate).  2.    START taking Toprol XL (Metoprolol Succinate) 25 MG once a day.  *If you need a refill on your cardiac medications before your next appointment, please call your pharmacy*   Lab Work: None ordered If you have labs (blood work) drawn today and your tests are completely normal, you will receive your results only by: [place] (if you have MyChart) OR A paper copy in the mail If you have any lab test that is abnormal or we need to change your treatment, we will call you to review the results.   Testing/Procedures: None ordered   Follow-Up: At [facility], you and your health needs are our priority.  As part of our continuing mission to provide you with exceptional heart care, we have created designated Provider Care Teams.  These Care Teams include your primary Cardiologist (physician) and Advanced Practice Providers (APPs -  Physician Assistants and Nurse Practitioners) who all  work together to provide you with the care you need, when you need it.  We recommend signing up for the patient portal called "  MyChart".  Sign up information is provided on this After Visit Summary.  MyChart is used to connect with patients for Virtual Visits (Telemedicine).  Patients are able to view lab/test results, encounter notes, upcoming appointments, etc.  Non-urgent messages can be sent to your provider as well.   To learn more about what you can do with MyChart, go to [email or web address].    Your next appointment:   1 year(s)  The format for your next appointment:   In Person  Provider:   Kate Sable, MD   Other Instructions     Signed, Kate Sable, MD  06/18/2021 1:18 PM    [place]

## 2021-06-18 NOTE — Patient Instructions (Signed)
Medication Instructions:   Your physician has recommended you make the following change in your medication:    STOP taking Lopressor (Metoprolol Tartrate).  2.    START taking Toprol XL (Metoprolol Succinate) 25 MG once a day.  *If you need a refill on your cardiac medications before your next appointment, please call your pharmacy*   Lab Work: None ordered If you have labs (blood work) drawn today and your tests are completely normal, you will receive your results only by: [place] (if you have MyChart) OR A paper copy in the mail If you have any lab test that is abnormal or we need to change your treatment, we will call you to review the results.   Testing/Procedures: None ordered   Follow-Up: At [facility], you and your health needs are our priority.  As part of our continuing mission to provide you with exceptional heart care, we have created designated Provider Care Teams.  These Care Teams include your primary Cardiologist (physician) and Advanced Practice Providers (APPs -  Physician Assistants and Nurse Practitioners) who all work together to provide you with the care you need, when you need it.  We recommend signing up for the patient portal called "MyChart".  Sign up information is provided on this After Visit Summary.  MyChart is used to connect with patients for Virtual Visits (Telemedicine).  Patients are able to view lab/test results, encounter notes, upcoming appointments, etc.  Non-urgent messages can be sent to your provider as well.   To learn more about what you can do with MyChart, go to [email or web address].    Your next appointment:   1 year(s)  The format for your next appointment:   In Person  Provider:   Kate Sable, MD   Other Instructions

## 2021-06-23 ENCOUNTER — Other Ambulatory Visit: Payer: Self-pay | Admitting: Neurosurgery

## 2021-07-08 ENCOUNTER — Encounter
Admission: RE | Admit: 2021-07-08 | Discharge: 2021-07-08 | Disposition: A | Discharge status: Home / Self Care | Payer: PPO | Source: Ambulatory Visit | Attending: Neurosurgery | Admitting: Neurosurgery

## 2021-07-08 ENCOUNTER — Other Ambulatory Visit: Payer: Self-pay

## 2021-07-08 ENCOUNTER — Other Ambulatory Visit: Payer: Self-pay | Admitting: Family Medicine

## 2021-07-08 DIAGNOSIS — Z01812 Encounter for preprocedural laboratory examination: Secondary | ICD-10-CM | POA: Diagnosis not present

## 2021-07-08 DIAGNOSIS — J309 Allergic rhinitis, unspecified: Secondary | ICD-10-CM

## 2021-07-08 HISTORY — DX: Malignant (primary) neoplasm, unspecified: C80.1

## 2021-07-08 HISTORY — DX: Nausea with vomiting, unspecified: R11.2

## 2021-07-08 HISTORY — DX: Cardiac murmur, unspecified: R01.1

## 2021-07-08 HISTORY — DX: Unspecified osteoarthritis, unspecified site: M19.90

## 2021-07-08 HISTORY — DX: Other specified postprocedural states: Z98.890

## 2021-07-08 HISTORY — DX: Other complications of anesthesia, initial encounter: T88.59XA

## 2021-07-08 LAB — CBC
HCT: 39.2 % (ref 36.0–46.0)
Hemoglobin: 13.3 g/dL (ref 12.0–15.0)
MCH: 32.7 pg (ref 26.0–34.0)
MCHC: 33.9 g/dL (ref 30.0–36.0)
MCV: 96.3 fL (ref 80.0–100.0)
Platelets: 219 10*3/uL (ref 150–400)
RBC: 4.07 MIL/uL (ref 3.87–5.11)
RDW: 13 % (ref 11.5–15.5)
WBC: 5 10*3/uL (ref 4.0–10.5)
nRBC: 0 % (ref 0.0–0.2)

## 2021-07-08 LAB — BASIC METABOLIC PANEL
Anion gap: 7 (ref 5–15)
BUN: 18 mg/dL (ref 8–23)
CO2: 31 mmol/L (ref 22–32)
Calcium: 9.2 mg/dL (ref 8.9–10.3)
Chloride: 102 mmol/L (ref 98–111)
Creatinine, Ser: 0.71 mg/dL (ref 0.44–1.00)
GFR, Estimated: >60 mL/min (ref >60)
Glucose, Bld: 89 mg/dL (ref 70–99)
Potassium: 4 mmol/L (ref 3.5–5.1)
Sodium: 140 mmol/L (ref 135–145)

## 2021-07-08 LAB — SURGICAL PCR SCREEN
MRSA, PCR: NEGATIVE
Staphylococcus aureus: NEGATIVE

## 2021-07-08 LAB — TYPE AND SCREEN
ABO/RH(D): O POS
Antibody Screen: NEGATIVE

## 2021-07-08 LAB — APTT: aPTT: 34 seconds (ref 24–36)

## 2021-07-08 LAB — PROTIME-INR
INR: 1 (ref 0.8–1.2)
Prothrombin Time: 13.5 seconds (ref 11.4–15.2)

## 2021-07-08 NOTE — Patient Instructions (Addendum)
Your procedure is scheduled on: 07/20/21 - Monday Report to the Registration Desk on the 1st floor of the [facility]. To find out your arrival time, please call [phone] between 1PM - 3PM on: 07/17/21  - Report to [facility] for Covid test on 07/16/21 8:00 am.  REMEMBER: Instructions that are not followed completely may result in serious medical risk, up to and including death; or upon the discretion of your surgeon and anesthesiologist your surgery may need to be rescheduled.  Do not eat food after midnight the night before surgery.  No gum chewing, lozengers or hard candies.  You may however, drink CLEAR liquids up to 2 hours before you are scheduled to arrive for your surgery. Do not drink anything within 2 hours of your scheduled arrival time.  Clear liquids include: - water  - apple juice without pulp - gatorade (not RED, PURPLE, OR BLUE) - black coffee or tea (Do NOT add milk or creamers to the coffee or tea) Do NOT drink anything that is not on this list.  TAKE THESE MEDICATIONS THE MORNING OF SURGERY WITH A SIP OF WATER:  - fexofenadine (ALLEGRA) 180 MG tablet - gabapentin (NEURONTIN) 300 MG capsule - metoprolol succinate (TOPROL XL) 25 MG 24 hr tablet - sertraline (ZOLOFT) 50 MG tablet  Follow recommendations from Cardiologist, Pulmonologist or PCP regarding stopping Aspirin, Coumadin, Plavix, Eliquis, Pradaxa, or Pletal. Stop Aspirin and Diclofenic 1 week prior to surgery , may resume 1 week after surgery.  One week prior to surgery: Stop Anti-inflammatories (NSAIDS) such as Advil, Aleve, Ibuprofen, Motrin, Naproxen, Naprosyn and Aspirin based products such as Excedrin, Goodys Powder, BC Powder.  Stop ANY OVER THE COUNTER supplements until after surgery.  You may take Tylenol if needed for pain up until the day of surgery.  No Alcohol for 24 hours before or after surgery.  No Smoking including e-cigarettes for 24 hours prior to surgery.  No chewable tobacco  products for at least 6 hours prior to surgery.  No nicotine patches on the day of surgery.  Do not use any "recreational" drugs for at least a week prior to your surgery.  Please be advised that the combination of cocaine and anesthesia may have negative outcomes, up to and including death. If you test positive for cocaine, your surgery will be cancelled.  On the morning of surgery brush your teeth with toothpaste and water, you may rinse your mouth with mouthwash if you wish. Do not swallow any toothpaste or mouthwash.  Do not wear jewelry, make-up, hairpins, clips or nail polish.  Do not wear lotions, powders, or perfumes.   Do not shave body from the neck down 48 hours prior to surgery just in case you cut yourself which could leave a site for infection.  Also, freshly shaved skin may become irritated if using the CHG soap.  Contact lenses, hearing aids and dentures may not be worn into surgery.  Do not bring valuables to the hospital. [facility] is not responsible for any missing/lost belongings or valuables.   Use CHG Soap or wipes as directed on instruction sheet.  Notify your doctor if there is any change in your medical condition (cold, fever, infection).  Wear comfortable clothing (specific to your surgery type) to the hospital.  After surgery, you can help prevent lung complications by doing breathing exercises.  Take deep breaths and cough every 1-2 hours. Your doctor may order a device called an Incentive Spirometer to help you take deep breaths.  When coughing or sneezing, hold a pillow firmly against your incision with both hands. This is called "splinting." Doing this helps protect your incision. It also decreases belly discomfort.  If you are being admitted to the hospital overnight, leave your suitcase in the car. After surgery it may be brought to your room.  If you are being discharged the day of surgery, you will not be allowed to drive home. You will need a  responsible adult (18 years or older) to drive you home and stay with you that night.   If you are taking public transportation, you will need to have a responsible adult (18 years or older) with you. Please confirm with your physician that it is acceptable to use public transportation.   Please call the [place] Dept. at [phone] if you have any questions about these instructions.  Surgery Visitation Policy:  Patients undergoing a surgery or procedure may have one family member or support person with them as long as that person is not COVID-19 positive or experiencing its symptoms.  That person may remain in the waiting area during the procedure.  Inpatient Visitation:    Visiting hours are 7 a.m. to 8 p.m. Inpatients will be allowed two visitors daily. The visitors may change each day during the patient's stay. No visitors under the age of 52. Any visitor under the age of 59 must be accompanied by an adult. The visitor must pass COVID-19 screenings, use hand sanitizer when entering and exiting the patient's room and wear a mask at all times, including in the patient's room. Patients must also wear a mask when staff or their visitor are in the room. Masking is required regardless of vaccination status.

## 2021-07-09 ENCOUNTER — Other Ambulatory Visit: Payer: Self-pay | Admitting: *Deleted

## 2021-07-09 MED ORDER — IRBESARTAN 150 MG PO TABS: 150.0000 mg | ORAL_TABLET | Freq: Every day | ORAL | 5 refills | Status: DC

## 2021-07-10 DIAGNOSIS — M5416 Radiculopathy, lumbar region: Secondary | ICD-10-CM | POA: Diagnosis not present

## 2021-07-17 ENCOUNTER — Other Ambulatory Visit
Admission: RE | Admit: 2021-07-17 | Discharge: 2021-07-17 | Disposition: A | Discharge status: Home / Self Care | Payer: PPO | Source: Ambulatory Visit | Attending: Neurosurgery | Admitting: Neurosurgery

## 2021-07-17 ENCOUNTER — Other Ambulatory Visit: Payer: Self-pay

## 2021-07-17 DIAGNOSIS — Z20822 Contact with and (suspected) exposure to covid-19: Secondary | ICD-10-CM | POA: Insufficient documentation

## 2021-07-17 DIAGNOSIS — Z01812 Encounter for preprocedural laboratory examination: Secondary | ICD-10-CM | POA: Insufficient documentation

## 2021-07-17 LAB — SARS CORONAVIRUS 2 (TAT 6-24 HRS): SARS Coronavirus 2: NEGATIVE

## 2021-07-20 ENCOUNTER — Encounter: Payer: Self-pay | Admitting: Neurosurgery

## 2021-07-20 ENCOUNTER — Ambulatory Visit
Admission: RE | Admit: 2021-07-20 | Discharge: 2021-07-20 | Disposition: A | Discharge status: Home / Self Care | Payer: PPO | Attending: Neurosurgery | Admitting: Neurosurgery

## 2021-07-20 ENCOUNTER — Other Ambulatory Visit: Payer: Self-pay

## 2021-07-20 ENCOUNTER — Ambulatory Visit: Payer: PPO | Admitting: Anesthesiology

## 2021-07-20 ENCOUNTER — Ambulatory Visit: Payer: PPO | Admitting: Urgent Care

## 2021-07-20 ENCOUNTER — Ambulatory Visit: Payer: PPO

## 2021-07-20 ENCOUNTER — Encounter
Admission: RE | Disposition: A | Discharge status: Home / Self Care | Payer: Self-pay | Source: Home / Self Care | Attending: Neurosurgery

## 2021-07-20 DIAGNOSIS — Z419 Encounter for procedure for purposes other than remedying health state, unspecified: Secondary | ICD-10-CM

## 2021-07-20 DIAGNOSIS — Z7982 Long term (current) use of aspirin: Secondary | ICD-10-CM | POA: Insufficient documentation

## 2021-07-20 DIAGNOSIS — Z791 Long term (current) use of non-steroidal anti-inflammatories (NSAID): Secondary | ICD-10-CM | POA: Insufficient documentation

## 2021-07-20 DIAGNOSIS — Z96653 Presence of artificial knee joint, bilateral: Secondary | ICD-10-CM | POA: Diagnosis not present

## 2021-07-20 DIAGNOSIS — Z4889 Encounter for other specified surgical aftercare: Secondary | ICD-10-CM | POA: Diagnosis not present

## 2021-07-20 DIAGNOSIS — Z79899 Other long term (current) drug therapy: Secondary | ICD-10-CM | POA: Insufficient documentation

## 2021-07-20 DIAGNOSIS — Z888 Allergy status to other drugs, medicaments and biological substances status: Secondary | ICD-10-CM | POA: Diagnosis not present

## 2021-07-20 DIAGNOSIS — Z87891 Personal history of nicotine dependence: Secondary | ICD-10-CM | POA: Diagnosis not present

## 2021-07-20 DIAGNOSIS — M48062 Spinal stenosis, lumbar region with neurogenic claudication: Secondary | ICD-10-CM | POA: Diagnosis not present

## 2021-07-20 DIAGNOSIS — M5416 Radiculopathy, lumbar region: Secondary | ICD-10-CM | POA: Insufficient documentation

## 2021-07-20 DIAGNOSIS — M48061 Spinal stenosis, lumbar region without neurogenic claudication: Secondary | ICD-10-CM | POA: Diagnosis not present

## 2021-07-20 DIAGNOSIS — Z885 Allergy status to narcotic agent status: Secondary | ICD-10-CM | POA: Insufficient documentation

## 2021-07-20 HISTORY — PX: LUMBAR LAMINECTOMY/DECOMPRESSION MICRODISCECTOMY: SHX5026

## 2021-07-20 LAB — ABO/RH: ABO/RH(D): O POS

## 2021-07-20 SURGERY — LUMBAR LAMINECTOMY/DECOMPRESSION MICRODISCECTOMY 1 LEVEL
Anesthesia: General | Laterality: Left

## 2021-07-20 MED ORDER — ONDANSETRON HCL 4 MG/2ML IJ SOLN: 4.0000 mg | Freq: Once | INTRAMUSCULAR | Status: DC | PRN

## 2021-07-20 MED ORDER — 0.9 % SODIUM CHLORIDE (POUR BTL) OPTIME
TOPICAL | Status: DC | PRN
  Administered 2021-07-20: 1000 mL

## 2021-07-20 MED ORDER — FENTANYL CITRATE (PF) 100 MCG/2ML IJ SOLN: 25.0000 ug | INTRAMUSCULAR | Status: DC | PRN

## 2021-07-20 MED ORDER — FAMOTIDINE 20 MG PO TABS
ORAL_TABLET | ORAL | Status: AC
  Administered 2021-07-20: 20 mg via ORAL
  Filled 2021-07-20: qty 1

## 2021-07-20 MED ORDER — VASOPRESSIN 20 UNIT/ML IV SOLN
INTRAVENOUS | Status: DC | PRN
  Administered 2021-07-20 (×3): 1 [IU] via INTRAVENOUS

## 2021-07-20 MED ORDER — LIDOCAINE HCL (CARDIAC) PF 100 MG/5ML IV SOSY
PREFILLED_SYRINGE | INTRAVENOUS | Status: DC | PRN
  Administered 2021-07-20: 80 mg via INTRAVENOUS

## 2021-07-20 MED ORDER — FAMOTIDINE 20 MG PO TABS: 20.0000 mg | ORAL_TABLET | Freq: Once | ORAL | Status: AC

## 2021-07-20 MED ORDER — METHYLPREDNISOLONE ACETATE 40 MG/ML IJ SUSP
INTRAMUSCULAR | Status: DC | PRN
  Administered 2021-07-20: 40 mg

## 2021-07-20 MED ORDER — APREPITANT 40 MG PO CAPS
ORAL_CAPSULE | ORAL | Status: AC
  Administered 2021-07-20: 40 mg via ORAL
  Filled 2021-07-20: qty 1

## 2021-07-20 MED ORDER — PROPOFOL 10 MG/ML IV BOLUS
INTRAVENOUS | Status: DC | PRN
  Administered 2021-07-20: 120 mg via INTRAVENOUS

## 2021-07-20 MED ORDER — CHLORHEXIDINE GLUCONATE 0.12 % MT SOLN: 15.0000 mL | Freq: Once | OROMUCOSAL | Status: AC

## 2021-07-20 MED ORDER — LIDOCAINE HCL (PF) 2 % IJ SOLN
INTRAMUSCULAR | Status: AC
  Filled 2021-07-20: qty 5

## 2021-07-20 MED ORDER — DEXAMETHASONE SODIUM PHOSPHATE 10 MG/ML IJ SOLN
INTRAMUSCULAR | Status: DC | PRN
  Administered 2021-07-20: 10 mg via INTRAVENOUS

## 2021-07-20 MED ORDER — CEFAZOLIN SODIUM-DEXTROSE 2-3 GM-%(50ML) IV SOLR
INTRAVENOUS | Status: DC | PRN
  Administered 2021-07-20: 2 g via INTRAVENOUS

## 2021-07-20 MED ORDER — EPHEDRINE SULFATE 50 MG/ML IJ SOLN
INTRAMUSCULAR | Status: DC | PRN
  Administered 2021-07-20 (×3): 10 mg via INTRAVENOUS
  Administered 2021-07-20: 5 mg via INTRAVENOUS

## 2021-07-20 MED ORDER — EPHEDRINE 5 MG/ML INJ
INTRAVENOUS | Status: AC
  Filled 2021-07-20: qty 5

## 2021-07-20 MED ORDER — METHOCARBAMOL 500 MG PO TABS: 500.0000 mg | ORAL_TABLET | Freq: Three times a day (TID) | ORAL | 0 refills | Status: DC | PRN

## 2021-07-20 MED ORDER — SUCCINYLCHOLINE CHLORIDE 200 MG/10ML IV SOSY
PREFILLED_SYRINGE | INTRAVENOUS | Status: AC
  Filled 2021-07-20: qty 10

## 2021-07-20 MED ORDER — HEMOSTATIC AGENTS (NO CHARGE) OPTIME
TOPICAL | Status: DC | PRN
  Administered 2021-07-20: 1 via TOPICAL

## 2021-07-20 MED ORDER — APREPITANT 40 MG PO CAPS: 40.0000 mg | ORAL_CAPSULE | Freq: Once | ORAL | Status: AC

## 2021-07-20 MED ORDER — MEPERIDINE HCL 25 MG/ML IJ SOLN: 6.2500 mg | INTRAMUSCULAR | Status: DC | PRN

## 2021-07-20 MED ORDER — ONDANSETRON HCL 4 MG/2ML IJ SOLN
INTRAMUSCULAR | Status: AC
  Filled 2021-07-20: qty 2

## 2021-07-20 MED ORDER — ONDANSETRON HCL 4 MG/2ML IJ SOLN
INTRAMUSCULAR | Status: DC | PRN
  Administered 2021-07-20: 4 mg via INTRAVENOUS

## 2021-07-20 MED ORDER — CHLORHEXIDINE GLUCONATE 0.12 % MT SOLN
OROMUCOSAL | Status: AC
  Administered 2021-07-20: 15 mL via OROMUCOSAL
  Filled 2021-07-20: qty 15

## 2021-07-20 MED ORDER — CEFAZOLIN SODIUM-DEXTROSE 2-4 GM/100ML-% IV SOLN
INTRAVENOUS | Status: AC
  Filled 2021-07-20: qty 100

## 2021-07-20 MED ORDER — FENTANYL CITRATE (PF) 100 MCG/2ML IJ SOLN
INTRAMUSCULAR | Status: DC | PRN
  Administered 2021-07-20: 75 ug via INTRAVENOUS

## 2021-07-20 MED ORDER — REMIFENTANIL HCL 1 MG IV SOLR
INTRAVENOUS | Status: DC | PRN
  Administered 2021-07-20: .08 ug/kg/min via INTRAVENOUS

## 2021-07-20 MED ORDER — GLYCOPYRROLATE 0.2 MG/ML IJ SOLN
INTRAMUSCULAR | Status: DC | PRN
  Administered 2021-07-20: .2 mg via INTRAVENOUS

## 2021-07-20 MED ORDER — ORAL CARE MOUTH RINSE
15.0000 mL | Freq: Once | OROMUCOSAL | Status: AC
Start: 2021-07-20 — End: 2021-07-20

## 2021-07-20 MED ORDER — PROPOFOL 10 MG/ML IV BOLUS
INTRAVENOUS | Status: AC
  Filled 2021-07-20: qty 20

## 2021-07-20 MED ORDER — HYDROCODONE-ACETAMINOPHEN 5-325 MG PO TABS: 1.0000 | ORAL_TABLET | Freq: Four times a day (QID) | ORAL | 0 refills | Status: AC | PRN

## 2021-07-20 MED ORDER — REMIFENTANIL HCL 1 MG IV SOLR
INTRAVENOUS | Status: AC
  Filled 2021-07-20: qty 1000

## 2021-07-20 MED ORDER — DEXAMETHASONE SODIUM PHOSPHATE 10 MG/ML IJ SOLN
INTRAMUSCULAR | Status: AC
  Filled 2021-07-20: qty 1

## 2021-07-20 MED ORDER — FENTANYL CITRATE (PF) 100 MCG/2ML IJ SOLN
INTRAMUSCULAR | Status: AC
  Filled 2021-07-20: qty 2

## 2021-07-20 MED ORDER — THROMBIN 5000 UNITS EX SOLR
CUTANEOUS | Status: DC | PRN
  Administered 2021-07-20: 5000 [IU] via TOPICAL

## 2021-07-20 MED ORDER — SENNA 8.6 MG PO TABS: 1.0000 | ORAL_TABLET | Freq: Every day | ORAL | 0 refills | Status: DC | PRN

## 2021-07-20 MED ORDER — SUCCINYLCHOLINE CHLORIDE 200 MG/10ML IV SOSY
PREFILLED_SYRINGE | INTRAVENOUS | Status: DC | PRN
  Administered 2021-07-20: 100 mg via INTRAVENOUS

## 2021-07-20 MED ORDER — LACTATED RINGERS IV SOLN: INTRAVENOUS | Status: DC

## 2021-07-20 MED ORDER — BUPIVACAINE-EPINEPHRINE (PF) 0.5% -1:200000 IJ SOLN
INTRAMUSCULAR | Status: DC | PRN
  Administered 2021-07-20: 7 mL

## 2021-07-20 SURGICAL SUPPLY — 66 items
ADH SKN CLS APL DERMABOND .7 (GAUZE/BANDAGES/DRESSINGS) ×1
AGENT HMST MTR 8 SURGIFLO (HEMOSTASIS) ×1
APL PRP STRL LF DISP 70% ISPRP (MISCELLANEOUS) ×2
APL SRG 60D 8 XTD TIP BNDBL (TIP)
BUR NEURO DRILL SOFT 3.0X3.8M (BURR) ×2 IMPLANT
CHLORAPREP W/TINT 26 (MISCELLANEOUS) ×4 IMPLANT
COUNTER NEEDLE 20/40 LG (NEEDLE) ×2 IMPLANT
COVER LIGHT HANDLE STERIS (MISCELLANEOUS) ×4 IMPLANT
CUP MEDICINE 2OZ PLAST GRAD ST (MISCELLANEOUS) ×2 IMPLANT
DERMABOND ADVANCED (GAUZE/BANDAGES/DRESSINGS) ×1
DERMABOND ADVANCED .7 DNX12 (GAUZE/BANDAGES/DRESSINGS) ×1 IMPLANT
DRAPE C-ARM 42X72 X-RAY (DRAPES) ×4 IMPLANT
DRAPE LAPAROTOMY 100X77 ABD (DRAPES) ×2 IMPLANT
DRAPE MICROSCOPE SPINE 48X150 (DRAPES) ×2 IMPLANT
DRAPE SURG 17X11 SM STRL (DRAPES) ×2 IMPLANT
DRSG OPSITE POSTOP 4X6 (GAUZE/BANDAGES/DRESSINGS) IMPLANT
DRSG TEGADERM 4X4.75 (GAUZE/BANDAGES/DRESSINGS) IMPLANT
DRSG TELFA 4X3 1S NADH ST (GAUZE/BANDAGES/DRESSINGS) IMPLANT
DURASEAL APPLICATOR TIP (TIP) IMPLANT
DURASEAL SPINE SEALANT 3ML (MISCELLANEOUS) IMPLANT
ELECT CAUTERY BLADE TIP 2.5 (TIP) ×2
ELECT EZSTD 165MM 6.5IN (MISCELLANEOUS) ×2
ELECT REM PT RETURN 9FT ADLT (ELECTROSURGICAL) ×2
ELECTRODE CAUTERY BLDE TIP 2.5 (TIP) ×1 IMPLANT
ELECTRODE EZSTD 165MM 6.5IN (MISCELLANEOUS) ×1 IMPLANT
ELECTRODE REM PT RTRN 9FT ADLT (ELECTROSURGICAL) ×1 IMPLANT
GAUZE 4X4 16PLY ~~LOC~~+RFID DBL (SPONGE) ×2 IMPLANT
GAUZE SPONGE 4X4 12PLY STRL (GAUZE/BANDAGES/DRESSINGS) ×2 IMPLANT
GLOVE SRG 8 PF TXTR STRL LF DI (GLOVE) ×1 IMPLANT
GLOVE SURG SYN 6.5 ES PF (GLOVE) ×4 IMPLANT
GLOVE SURG SYN 6.5 PF PI (GLOVE) ×2 IMPLANT
GLOVE SURG SYN 8.0 (GLOVE) ×4 IMPLANT
GLOVE SURG SYN 8.0 PF PI (GLOVE) ×2 IMPLANT
GLOVE SURG UNDER POLY LF SZ6.5 (GLOVE) ×4 IMPLANT
GLOVE SURG UNDER POLY LF SZ8 (GLOVE) ×2
GOWN SRG LRG LVL 4 IMPRV REINF (GOWNS) ×2 IMPLANT
GOWN STRL REIN LRG LVL4 (GOWNS) ×4
GOWN STRL REUS W/ TWL XL LVL3 (GOWN DISPOSABLE) ×2 IMPLANT
GOWN STRL REUS W/TWL XL LVL3 (GOWN DISPOSABLE) ×4
GRADUATE 1200CC STRL 31836 (MISCELLANEOUS) ×2 IMPLANT
IV CATH ANGIO 12GX3 LT BLUE (NEEDLE) ×1 IMPLANT
KIT TURNOVER KIT A (KITS) ×2 IMPLANT
KIT WILSON FRAME (KITS) ×2 IMPLANT
KNIFE BAYONET SHORT DISCETOMY (MISCELLANEOUS) IMPLANT
MANIFOLD NEPTUNE II (INSTRUMENTS) ×2 IMPLANT
MARKER SKIN DUAL TIP RULER LAB (MISCELLANEOUS) ×4 IMPLANT
NDL SAFETY ECLIPSE 18X1.5 (NEEDLE) ×1 IMPLANT
NEEDLE HYPO 18GX1.5 SHARP (NEEDLE) ×2
NEEDLE HYPO 22GX1.5 SAFETY (NEEDLE) ×2 IMPLANT
NS IRRIG 1000ML POUR BTL (IV SOLUTION) ×2 IMPLANT
PACK LAMINECTOMY NEURO (CUSTOM PROCEDURE TRAY) ×2 IMPLANT
PAD ARMBOARD 7.5X6 YLW CONV (MISCELLANEOUS) ×2 IMPLANT
SPOGE SURGIFLO 8M (HEMOSTASIS) ×2
SPONGE SURGIFLO 8M (HEMOSTASIS) ×1 IMPLANT
SPONGE T-LAP 4X18 ~~LOC~~+RFID (SPONGE) ×1 IMPLANT
STAPLER SKIN PROX 35W (STAPLE) IMPLANT
SUT NURALON 4 0 TR CR/8 (SUTURE) IMPLANT
SUT POLYSORB 2-0 5X18 GS-10 (SUTURE) ×2 IMPLANT
SUT VIC AB 0 CT1 18XCR BRD 8 (SUTURE) ×1 IMPLANT
SUT VIC AB 0 CT1 8-18 (SUTURE) ×2
SYR 10ML LL (SYRINGE) ×4 IMPLANT
SYR 30ML LL (SYRINGE) ×2 IMPLANT
SYR 3ML LL SCALE MARK (SYRINGE) ×2 IMPLANT
TOWEL OR 17X26 4PK STRL BLUE (TOWEL DISPOSABLE) ×8 IMPLANT
TUBING CONNECTING 10 (TUBING) ×2 IMPLANT
WATER STERILE IRR 500ML POUR (IV SOLUTION) ×2 IMPLANT

## 2021-07-20 NOTE — Op Note (Signed)
Operative Note   SURGERY DATE: 07/20/2021   PRE-OP DIAGNOSIS:  Lumbar Stenosis with Lumbar Radiculopathy (m48.062)   POST-OP DIAGNOSIS: Post-Op Diagnosis Codes:  Lumbar Stenosis with Lumbar Radiculopathy [id number])   Procedure(s) with comments: Left L4/5 Hemilaminectomy with Facetectomy and Foraminotomy   SURGEON:     * Malen Gauze, MD       Cooper Render, PA Assistant   ANESTHESIA: General    OPERATIVE FINDINGS: Stenosis at L4/5   PROCEDURE NOTE Indication: Ms Lantzer presented to the clinic on 8/9 with ongoing left leg pain and numbness. She underwent conservative management to include OTC medications, steroid injection, and prescription medications without relief. MRI shows stenosis on the left L4/5 area. Given his symptoms, a left L4/5 hemilaminectomy and decompression was recommended. The risks of surgery were explained to include hematoma, infection, damage to nerve roots, CSF leak, weakness, numbness, pain, need for future surgery, heart attack, and stroke. She elected to proceed with surgery for symptom relief.    Procedure The patient was brought to the OR after informed consent was obtained. She was given general anesthesia and intubated by the anesthesia service. Vascular access lines were placed.The patient was then placed prone on a Wilson frame ensuring all pressure points were padded. A time-out was performed per protocol.    The patient was sterilely prepped and draped. The fluoroscopy unit was used to identify the L4/5 interspace and an incision marked in midline at that level. Local anesthetic was instilled. The skin was opened sharply and cautery used to make a fascial opening. The lamina of L4 and L5 was exposed on the left and retractors inserted. Fluoroscopy confirmed level.. The microscope was brought into the field.    Next, a drill bit was used to remove the inferior L4 lamina as well as the medial facet. The underlying ligament was freed and removed with  combination of rongeurs. The dura was then seen. All soft tissue overlying this was removed. This included medial synovial tissue and ligament going into the lateral recess. This was done until the dura appeared decompressed and a blunt instrument could be placed along the nerve roots. The area was irrigated. Hemostasis was achieved. Depo-medrol was placed along the nerve root.   The working channel was removed. The fascia was re-approximated with a 0 vicryl.  Next, multiple subcutaneous and dermal layers were closed with 2-0 vicryl until the epidermis was well approximated. The skin was closed with Dermabond.    The patient was returned to supine position and extubated by the anesthesia service. The patient was then taken to the PACU for post-operative care where she was moving extremities symmetrically.    ESTIMATED BLOOD LOSS:   20 cc   SPECIMENS None   IMPLANT None     I performed the case in its entirety with help of Cooper Render, [place]   [name], [place]

## 2021-07-20 NOTE — Anesthesia Postprocedure Evaluation (Signed)
Anesthesia Post Note  Patient: Morgan Byrd  Procedure(s) Performed: OPEN L4/5 LEFT HEMILAMINECTOMY & DISCECTOMY (Left)  Patient location during evaluation: PACU Anesthesia Type: General Level of consciousness: awake and alert Pain management: pain level controlled Vital Signs Assessment: post-procedure vital signs reviewed and stable Respiratory status: spontaneous breathing, nonlabored ventilation, respiratory function stable and patient connected to nasal cannula oxygen Cardiovascular status: blood pressure returned to baseline and stable Postop Assessment: no apparent nausea or vomiting Anesthetic complications: no   No notable events documented.   Last Vitals:  Vitals:   07/20/21 1345 07/20/21 1402  BP: 119/65 (!) 127/54  Pulse: 69 80  Resp: 14 18  Temp: (!) 36.3 C   SpO2: 96% 100%    Last Pain:  Vitals:   07/20/21 1402  TempSrc:   PainSc: 0-No pain                 Lenay Lovejoy Doyne Keel

## 2021-07-20 NOTE — H&P (Signed)
Morgan Byrd is an 85 y.o. female.   Chief Complaint: Left leg pain HPI: Morgan Byrd is here for evaluation of ongoing symptoms of left leg pain and numbness. She did go for injections and states that she got some relief but never more than a few days. She does feel like the pain is limiting what she can do and she can only walk a few blocks at this time without the pain being severe. She does complain of some mild back pain but the predominant symptom is the left leg pain. She is having some numbness in the left buttocks area. She does not endorse any right-sided pain. Her MRI did show left sided severe stenosis at L4/5 and we discussed decompression, She is here to proceed   Past Medical History:  Diagnosis Date   Anxiety    Arthritis    C2 cervical fracture ([place]) 11/14/2019   Cancer ([place])    Complication of anesthesia    Heart murmur    History of closed fracture of nasal bones 10/11/2019   Hypertension    Nasal fracture 11/14/2019   PONV (postoperative nausea and vomiting)     Past Surgical History:  Procedure Laterality Date   ABDOMINAL HYSTERECTOMY     BLADDER SUSPENSION     BREAST BIOPSY Right 1978   neg   COLONOSCOPY WITH PROPOFOL N/A 12/26/2015   Procedure: COLONOSCOPY WITH PROPOFOL;  Surgeon: Manya Silvas, MD;  Location: [facility] ENDOSCOPY;  Service: Endoscopy;  Laterality: N/A;   NOSE SURGERY     REPLACEMENT TOTAL KNEE BILATERAL     SHOULDER SURGERY     VAGINAL DELIVERY     2    Family History  Problem Relation Age of Onset   Dementia Mother    Arthritis Mother    Heart disease Father    Hypertension Father    Cancer Father        prostate   Diabetes Father    Diabetes Sister    Cancer Sister        breast   Breast cancer Sister 76   Heart disease Sister    Vasculitis Son    Kidney disease Son    Pneumonia Son    Breast cancer Cousin 62   Social History:  reports that she quit smoking about 48 years ago. Her smoking use included cigarettes. She has a 4.00  pack-year smoking history. She has never used smokeless tobacco. She reports that she does not drink alcohol and does not use drugs.  Allergies:  Allergies  Allergen Reactions   Codeine Nausea And Vomiting   Fosamax [Alendronate Sodium]     Couldn't walk   Lexapro [Escitalopram Oxalate]     Unknown reaction   Spiriva Respimat [Tiotropium Bromide Monohydrate]     dizziness    Medications Prior to Admission  Medication Sig Dispense Refill   aspirin 81 MG EC tablet Take 81 mg by mouth at bedtime. Swallow whole.     B Complex-C (SUPER B COMPLEX PO) Take 1 tablet by mouth daily.     CALCIUM-MAGNESIUM-ZINC PO Take 1 tablet by mouth daily.     diclofenac (VOLTAREN) 75 MG EC tablet Take 1 tablet (75 mg total) by mouth 2 (two) times daily as needed for moderate pain. 60 tablet 1   fexofenadine (ALLEGRA) 180 MG tablet TAKE ONE TABLET EVERY DAY 90 tablet 1   fluticasone (FLONASE) 50 MCG/ACT nasal spray TAKE 2 SPRAYS INTO BOTH NOSTRILS DAILY 16 g 1  gabapentin (NEURONTIN) 300 MG capsule Take 1 capsule (300 mg total) by mouth 3 (three) times daily. 90 capsule 5   hydrochlorothiazide (MICROZIDE) 12.5 MG capsule TAKE 1 CAPSULE EVERY DAY 90 capsule 3   irbesartan (AVAPRO) 150 MG tablet Take 1 tablet (150 mg total) by mouth daily. 30 tablet 5   methimazole (TAPAZOLE) 5 MG tablet Take 2.5 mg by mouth at bedtime.     metoprolol succinate (TOPROL XL) 25 MG 24 hr tablet Take 1 tablet (25 mg total) by mouth daily. 30 tablet 5   montelukast (SINGULAIR) 10 MG tablet TAKE ONE TABLET BY MOUTH AT BEDTIME 90 tablet 1   Multiple Vitamin (MULTIVITAMIN) capsule Take 1 capsule by mouth daily.     sertraline (ZOLOFT) 50 MG tablet TAKE 1 TABLET BY MOUTH DAILY 30 tablet 3    Results for orders placed or performed during the hospital encounter of 07/20/21 (from the past 48 hour(s))  ABO/Rh     Status: None (Preliminary result)   Collection Time: 07/20/21  9:13 AM  Result Value Ref Range   ABO/RH(D) PENDING    No  results found.  Review of Systems General ROS: Negative Respiratory ROS: Negative Cardiovascular ROS: Negative Gastrointestinal ROS: Negative Genito-Urinary ROS: Negative Musculoskeletal ROS: Positive for back pain Neurological ROS: Positive for leg pain Dermatological ROS: Negative   Blood pressure (!) 145/65, pulse (!) 56, temperature 97.8 F (36.6 C), temperature source Tympanic, resp. rate 16, height 5' 1.5" (1.562 m), weight 63.2 kg, SpO2 98 %. Physical Exam  General appearance: Alert, cooperative, in no acute distress  Neurologic exam:  Mental status: alertness: alert, affect: normal Speech: fluent and clear Motor:strength symmetric 5/5 in bilateral lower extremities Sensory: intact to light touch in bilateral lower extremities Gait: Antalgic gait  Imaging: MRI lumbar spine:1. Severe canal stenosis at L4-L5. Moderate canal stenosis at L2-L3 and mild to moderate canal stenosis at L4-L5.  2. Moderate to severe foraminal stenosis on the right at L3-L4. Moderate foraminal stenosis on the right at L1-L2 and L2-L3 and the  left at L4-L5 and L5-S1. Additional multilevel mild foraminal stenosis as detailed above. 3. Levocurvature of the lumbar spine with leftward translation of L3 on L4 and grade 1 anterolisthesis of L4 on L5.   Assessment/Plan Proceed with L4/5 left sided decompression  Morgan Perla, MD 07/20/2021, 10:37 AM

## 2021-07-20 NOTE — Transfer of Care (Signed)
Immediate Anesthesia Transfer of Care Note  Patient: Morgan Byrd  Procedure(s) Performed: OPEN L4/5 LEFT HEMILAMINECTOMY & DISCECTOMY (Left)  Patient Location: PACU  Anesthesia Type:General  Level of Consciousness: awake, alert  and oriented  Airway & Oxygen Therapy: Patient Spontanous Breathing and Patient connected to face mask oxygen  Post-op Assessment: Report given to RN and Post -op Vital signs reviewed and stable  Post vital signs: Reviewed and stable  Last Vitals:  Vitals Value Taken Time  BP 119/65 07/20/21 1346  Temp    Pulse 80 07/20/21 1349  Resp 20 07/20/21 1349  SpO2 98 % 07/20/21 1349  Vitals shown include unvalidated device data.  Last Pain:  Vitals:   07/20/21 0906  TempSrc: Tympanic  PainSc: 0-No pain      Patients Stated Pain Goal: 0 ([id number])  Complications: No notable events documented.

## 2021-07-20 NOTE — Anesthesia Procedure Notes (Signed)
Procedure Name: Intubation Date/Time: 07/20/2021 11:34 AM Performed by: Rona Ravens, CRNA Pre-anesthesia Checklist: Patient identified, Patient being monitored, Timeout performed, Emergency Drugs available and Suction available Patient Re-evaluated:Patient Re-evaluated prior to induction Oxygen Delivery Method: Circle system utilized Preoxygenation: Pre-oxygenation with 100% oxygen Induction Type: IV induction Ventilation: Mask ventilation without difficulty Laryngoscope Size: McGraph and 4 Grade View: Grade I Tube type: Oral Tube size: 7.0 mm Number of attempts: 1 Airway Equipment and Method: Stylet Placement Confirmation: ETT inserted through vocal cords under direct vision, positive ETCO2 and breath sounds checked- equal and bilateral Secured at: 21 cm Tube secured with: Tape Dental Injury: Teeth and Oropharynx as per pre-operative assessment

## 2021-07-20 NOTE — Consult Note (Signed)
Pharmacy Antibiotic Note  Morgan Byrd is a 85 y.o. female admitted with surgical prophylaxis.    Pharmacy has been consulted for cefazolin dosing.  Plan: Cefazolin 2g IV x 1  2 g for patients <120 kg; administer within 60 minutes of surgical incision  May repeat dose intraoperatively in 4 hours if procedure is lengthy or if there is excessive blood loss - please contact pharmacy if additional dosing warranted.    Estimated Creatinine Clearance: 46.8 mL/min (by C-G formula based on SCr of 0.71 mg/dL).    Allergies  Allergen Reactions   Codeine Nausea And Vomiting   Fosamax [Alendronate Sodium]     Couldn't walk   Lexapro [Escitalopram Oxalate]     Unknown reaction   Spiriva Respimat [Tiotropium Bromide Monohydrate]     dizziness     Thank you for allowing pharmacy to be a part of this patient's care.  Lu Duffel, PharmD, BCPS Clinical Pharmacist 07/20/2021 7:56 AM

## 2021-07-20 NOTE — Discharge Instructions (Addendum)
AMBULATORY SURGERY  DISCHARGE INSTRUCTIONS   The drugs that you were given will stay in your system until tomorrow so for the next 24 hours you should not:  Drive an automobile Make any legal decisions Drink any alcoholic beverage   You may resume regular meals tomorrow.  Today it is better to start with liquids and gradually work up to solid foods.  You may eat anything you prefer, but it is better to start with liquids, then soup and crackers, and gradually work up to solid foods.   Please notify your doctor immediately if you have any unusual bleeding, trouble breathing, redness and pain at the surgery site, drainage, fever, or pain not relieved by medication.     Your post-operative visit with Dr.                                       is: Date:                        Time:    Please call to schedule your post-operative visit.  Additional Instructions:  NEUROSURGERY DISCHARGE INSTRUCTIONS  Admission diagnosis: lumbar radiculopathy m54.16  Operative procedure: Left L4-5 laminectomy  What to do after you leave the hospital:  Recommended diet: regular diet. Increase protein intake to promote wound healing.  Recommended activity: no lifting, driving, or strenuous exercise for 6 weeks . No driving for 2 weeks.You should walk multiple times per day  Special Instructions  No straining, no heavy lifting > 10lbs x 4 weeks.  Keep incision area clean and dry. May shower in 2 days. No baths or pools for 6 weeks.    You have no sutures to remove, the skin is closed with adhesive  Please take pain medications as directed. Take a stool softener if on pain medications   Please Report any of the following: Nausea or Vomiting, Temperature is greater than 101.8F (38.1C) degrees, Dizziness, Abdominal Pain, Difficulty Breathing or Shortness of Breath, Inability to Eat, drink Fluids, or Take medications, Bleeding, swelling, or drainage from surgical incision sites, New numbness or  weakness, and Bowel or bladder dysfunction to the neurosurgeon on call at [phone]  Additional Follow up appointments Please follow up with Cooper Render PA-C in [place] [facility] as scheduled in 2-3 weeks   Please see below for scheduled appointments:  Future Appointments  Date Time Provider [place]  11/20/2021  9:00 AM Leone Haven, MD [facility]  04/12/2022  9:45 AM [facility] ADVISOR [facility]

## 2021-07-20 NOTE — Interval H&P Note (Signed)
History and Physical Interval Note:  07/20/2021 10:40 AM  Morgan Byrd  has presented today for surgery, with the diagnosis of lumbar radiculopathy m54.16.  The various methods of treatment have been discussed with the patient and family. After consideration of risks, benefits and other options for treatment, the patient has consented to  Procedure(s): OPEN L4/5 LEFT HEMILAMINECTOMY & DISCECTOMY (Left) as a surgical intervention.  The patient's history has been reviewed, patient examined, no change in status, stable for surgery.  I have reviewed the patient's chart and labs.  Questions were answered to the patient's satisfaction.     [name]

## 2021-07-20 NOTE — Anesthesia Preprocedure Evaluation (Addendum)
Anesthesia Evaluation  Patient identified by MRN, date of birth, ID band Patient awake    Reviewed: Allergy & Precautions, NPO status , Patient's Chart, lab work & pertinent test results, reviewed documented beta blocker date and time   History of Anesthesia Complications (+) PONV and history of anesthetic complications  Airway Mallampati: II  TM Distance: >3 FB Neck ROM: Full    Dental no notable dental hx. (+) Poor Dentition   Pulmonary neg pulmonary ROS, former smoker,    Pulmonary exam normal        Cardiovascular Exercise Tolerance: Good hypertension, Pt. on medications and Pt. on home beta blockers + Valvular Problems/Murmurs  Rhythm:Regular Rate:Normal     Neuro/Psych Anxiety  Neuromuscular disease    GI/Hepatic negative GI ROS, Neg liver ROS,   Endo/Other  Hyperthyroidism   Renal/GU negative Renal ROS     Musculoskeletal  (+) Arthritis , Osteoarthritis,    Abdominal   Peds  Hematology negative hematology ROS (+)   Anesthesia Other Findings Anxiety    Arthritis    C2 cervical fracture ([facility]) 11/14/2019  Cancer ([facility])    Complication of anesthesia    Heart murmur    History of closed fracture of nasal bones 10/11/2019   Hypertension    Nasal fracture 11/14/2019   PONV (postoperative nausea and vomiting)       Reproductive/Obstetrics                            Anesthesia Physical  Anesthesia Plan  ASA: 2  Anesthesia Plan: General   Post-op Pain Management:    Induction: Intravenous  PONV Risk Score and Plan: 3 and Propofol infusion, Ondansetron, Midazolam and Dexamethasone  Airway Management Planned: Oral ETT  Additional Equipment:   Intra-op Plan:   Post-operative Plan: Extubation in OR  Informed Consent: I have reviewed the patients History and Physical, chart, labs and discussed the procedure including the risks, benefits and alternatives for the proposed  anesthesia with the patient or authorized representative who has indicated his/her understanding and acceptance.       Plan Discussed with: CRNA, Anesthesiologist and Surgeon  Anesthesia Plan Comments:        Anesthesia Quick Evaluation

## 2021-07-20 NOTE — Discharge Summary (Signed)
Physician Discharge Summary  Patient ID: LAKRESHIA VASKE MRN: [id number] DOB/AGE: 1936-08-30 85 y.o.  Admit date: 07/20/2021 Discharge date: 07/20/2021  Admission Diagnoses: Lumbar radiculopathy   Discharge Diagnoses:  Active Problems:   * No active hospital problems. *   Discharged Condition: good  Hospital Course: LAVONDA IMDIEKE is a 85 y.o s/p left L4-5 laminectomy for decompression. Her interoperative course was uncomplicated. She was monitored in the PACU post-operatively and discharge home with pain medication after ambulating, urinating and tolerating PO intake.   Consults: None  Significant Diagnostic Studies: none  Treatments: surgery: See above  Discharge Exam: Blood pressure 139/61, pulse 81, temperature (!) 97.3 F (36.3 C), resp. rate 17, height 5' 1.5" (1.562 m), weight 63.2 kg, SpO2 100 %. CN II-XII grossly intact 5/5 throughout BLE  Disposition: Discharge disposition: 01-Home or Self Care       Discharge Instructions     Diet - low sodium heart healthy   Complete by: As directed    No wound care   Complete by: As directed       Allergies as of 07/20/2021       Reactions   Codeine Nausea And Vomiting   Fosamax [alendronate Sodium]    Couldn't walk   Lexapro [escitalopram Oxalate]    Unknown reaction   Spiriva Respimat [tiotropium Bromide Monohydrate]    dizziness        Medication List     STOP taking these medications    aspirin 81 MG EC tablet   diclofenac 75 MG EC tablet Commonly known as: VOLTAREN       TAKE these medications    CALCIUM-MAGNESIUM-ZINC PO Take 1 tablet by mouth daily.   fexofenadine 180 MG tablet Commonly known as: ALLEGRA TAKE ONE TABLET EVERY DAY   fluticasone 50 MCG/ACT nasal spray Commonly known as: FLONASE TAKE 2 SPRAYS INTO BOTH NOSTRILS DAILY   gabapentin 300 MG capsule Commonly known as: NEURONTIN Take 1 capsule (300 mg total) by mouth 3 (three) times daily.   hydrochlorothiazide 12.5 MG  capsule Commonly known as: MICROZIDE TAKE 1 CAPSULE EVERY DAY   HYDROcodone-acetaminophen 5-325 MG tablet Commonly known as: Norco Take 1 tablet by mouth every 6 (six) hours as needed for up to 5 days for moderate pain.   irbesartan 150 MG tablet Commonly known as: AVAPRO Take 1 tablet (150 mg total) by mouth daily.   methimazole 5 MG tablet Commonly known as: TAPAZOLE Take 2.5 mg by mouth at bedtime.   methocarbamol 500 MG tablet Commonly known as: Robaxin Take 1 tablet (500 mg total) by mouth every 8 (eight) hours as needed for muscle spasms.   metoprolol succinate 25 MG 24 hr tablet Commonly known as: Toprol XL Take 1 tablet (25 mg total) by mouth daily.   montelukast 10 MG tablet Commonly known as: SINGULAIR TAKE ONE TABLET BY MOUTH AT BEDTIME   multivitamin capsule Take 1 capsule by mouth daily.   senna 8.6 MG Tabs tablet Commonly known as: SENOKOT Take 1 tablet (8.6 mg total) by mouth daily as needed for mild constipation.   sertraline 50 MG tablet Commonly known as: ZOLOFT TAKE 1 TABLET BY MOUTH DAILY   SUPER B COMPLEX PO Take 1 tablet by mouth daily.        Follow-up Information     Loleta Dicker, PA Follow up in 2 week(s).   Why: for incision check Contact information: [place] [address] [phone]  Signed: Loleta Dicker 07/20/2021, 2:17 PM

## 2021-07-21 ENCOUNTER — Encounter: Payer: Self-pay | Admitting: Neurosurgery

## 2021-08-11 ENCOUNTER — Other Ambulatory Visit: Payer: Self-pay | Admitting: Family Medicine

## 2021-09-22 DIAGNOSIS — D2271 Melanocytic nevi of right lower limb, including hip: Secondary | ICD-10-CM | POA: Diagnosis not present

## 2021-09-22 DIAGNOSIS — L570 Actinic keratosis: Secondary | ICD-10-CM | POA: Diagnosis not present

## 2021-09-22 DIAGNOSIS — D2261 Melanocytic nevi of right upper limb, including shoulder: Secondary | ICD-10-CM | POA: Diagnosis not present

## 2021-09-22 DIAGNOSIS — D225 Melanocytic nevi of trunk: Secondary | ICD-10-CM | POA: Diagnosis not present

## 2021-09-22 DIAGNOSIS — D2262 Melanocytic nevi of left upper limb, including shoulder: Secondary | ICD-10-CM | POA: Diagnosis not present

## 2021-09-22 DIAGNOSIS — X32XXXA Exposure to sunlight, initial encounter: Secondary | ICD-10-CM | POA: Diagnosis not present

## 2021-09-22 DIAGNOSIS — Z85828 Personal history of other malignant neoplasm of skin: Secondary | ICD-10-CM | POA: Diagnosis not present

## 2021-10-10 ENCOUNTER — Other Ambulatory Visit: Payer: Self-pay | Admitting: Family Medicine

## 2021-10-10 DIAGNOSIS — J309 Allergic rhinitis, unspecified: Secondary | ICD-10-CM

## 2021-10-22 ENCOUNTER — Telehealth: Payer: Self-pay | Admitting: Family Medicine

## 2021-10-22 NOTE — Telephone Encounter (Signed)
I called and LVM for patient to call back.  Jeananne Bedwell,cma  

## 2021-10-22 NOTE — Telephone Encounter (Signed)
Pt called in requesting to speak with you or Dr. Caryl Bis about a prior problem she had. Pt wouldn't give details. Pt requesting callback.

## 2021-10-22 NOTE — Telephone Encounter (Signed)
Pt called in again requesting to speak with about prior problem she had. Pt stated that its important to give her a callback. Pt requesting callback

## 2021-10-23 NOTE — Telephone Encounter (Signed)
Patient stated she did not need anything right now.  Morgan Byrd,cma

## 2021-11-03 DIAGNOSIS — L309 Dermatitis, unspecified: Secondary | ICD-10-CM | POA: Diagnosis not present

## 2021-11-10 ENCOUNTER — Other Ambulatory Visit: Payer: Self-pay

## 2021-11-10 MED ORDER — METOPROLOL SUCCINATE ER 25 MG PO TB24: 25.0000 mg | ORAL_TABLET | Freq: Every day | ORAL | 5 refills | Status: DC

## 2021-11-20 ENCOUNTER — Telehealth: Payer: Self-pay | Admitting: Family Medicine

## 2021-11-20 ENCOUNTER — Ambulatory Visit: Payer: PPO | Admitting: Family Medicine

## 2021-11-20 NOTE — Telephone Encounter (Signed)
PA waiting approval for Prolia to be given appt on 12/07/21 approved long as still [employer].

## 2021-11-23 NOTE — Telephone Encounter (Signed)
Patient returned Kathy's phone call. Patient still has [employer]. Appointment made.

## 2021-11-25 NOTE — Telephone Encounter (Signed)
Scheduled 12/07/21 at [place].

## 2021-12-04 ENCOUNTER — Other Ambulatory Visit: Payer: Self-pay | Admitting: Family Medicine

## 2021-12-07 ENCOUNTER — Ambulatory Visit: Payer: PPO | Admitting: Family Medicine

## 2021-12-29 ENCOUNTER — Other Ambulatory Visit: Payer: Self-pay | Admitting: Family Medicine

## 2021-12-29 DIAGNOSIS — G629 Polyneuropathy, unspecified: Secondary | ICD-10-CM

## 2022-01-06 ENCOUNTER — Other Ambulatory Visit: Payer: Self-pay | Admitting: Family Medicine

## 2022-01-07 ENCOUNTER — Other Ambulatory Visit: Payer: Self-pay

## 2022-01-07 MED ORDER — IRBESARTAN 150 MG PO TABS: 150.0000 mg | ORAL_TABLET | Freq: Every day | ORAL | 5 refills | Status: DC

## 2022-01-27 ENCOUNTER — Ambulatory Visit (INDEPENDENT_AMBULATORY_CARE_PROVIDER_SITE_OTHER): Payer: PPO | Admitting: Family Medicine

## 2022-01-27 ENCOUNTER — Encounter: Payer: Self-pay | Admitting: Family Medicine

## 2022-01-27 ENCOUNTER — Other Ambulatory Visit: Payer: Self-pay

## 2022-01-27 DIAGNOSIS — R7989 Other specified abnormal findings of blood chemistry: Secondary | ICD-10-CM

## 2022-01-27 DIAGNOSIS — I10 Essential (primary) hypertension: Secondary | ICD-10-CM | POA: Diagnosis not present

## 2022-01-27 DIAGNOSIS — M5432 Sciatica, left side: Secondary | ICD-10-CM | POA: Diagnosis not present

## 2022-01-27 DIAGNOSIS — M810 Age-related osteoporosis without current pathological fracture: Secondary | ICD-10-CM

## 2022-01-27 DIAGNOSIS — F419 Anxiety disorder, unspecified: Secondary | ICD-10-CM | POA: Diagnosis not present

## 2022-01-27 LAB — VITAMIN D 25 HYDROXY (VIT D DEFICIENCY, FRACTURES): VITD: 59.8 ng/mL (ref 30.00–100.00)

## 2022-01-27 LAB — COMPREHENSIVE METABOLIC PANEL
ALT: 18 U/L (ref 0–35)
AST: 26 U/L (ref 0–37)
Albumin: 4.3 g/dL (ref 3.5–5.2)
Alkaline Phosphatase: 56 U/L (ref 39–117)
BUN: 20 mg/dL (ref 6–23)
CO2: 31 mEq/L (ref 19–32)
Calcium: 10 mg/dL (ref 8.4–10.5)
Chloride: 101 mEq/L (ref 96–112)
Creatinine, Ser: 0.78 mg/dL (ref 0.40–1.20)
GFR: 70.31 mL/min (ref >60.00)
Glucose, Bld: 95 mg/dL (ref 70–99)
Potassium: 3.8 mEq/L (ref 3.5–5.1)
Sodium: 139 mEq/L (ref 135–145)
Total Bilirubin: 0.8 mg/dL (ref 0.2–1.2)
Total Protein: 7.4 g/dL (ref 6.0–8.3)

## 2022-01-27 LAB — LIPID PANEL
Cholesterol: 192 mg/dL (ref 0–200)
HDL: 71.6 mg/dL (ref >39.00)
LDL Cholesterol: 104 mg/dL — ABNORMAL HIGH (ref 0–99)
NonHDL: 120.23
Total CHOL/HDL Ratio: 3
Triglycerides: 81 mg/dL (ref 0.0–149.0)
VLDL: 16.2 mg/dL (ref 0.0–40.0)

## 2022-01-27 MED ORDER — DENOSUMAB 60 MG/ML ~~LOC~~ SOSY
60.0000 mg | PREFILLED_SYRINGE | Freq: Once | SUBCUTANEOUS | Status: AC
  Administered 2022-01-27: 60 mg via SUBCUTANEOUS

## 2022-01-27 NOTE — Assessment & Plan Note (Signed)
Well-controlled.  She will continue Zoloft 50 mg once daily. °

## 2022-01-27 NOTE — Addendum Note (Signed)
Addended by: Fulton Mole D on: 01/27/2022 10:09 AM ? ? Modules accepted: Orders ? ?

## 2022-01-27 NOTE — Progress Notes (Signed)
?Tommi Rumps, MD ?Phone: [phone] ? ?Morgan Byrd is a 86 y.o. female who presents today for f/u. ? ?HYPERTENSION ?Disease Monitoring ?Home BP Monitoring notes its been "ok" Chest pain- no    Dyspnea- no ?Medications ?Compliance-  taking HCTZ, irbesartan, metoprolol.  Edema- no ?BMET ?   ?Component Value Date/Time  ? NA 140 07/08/2021 0937  ? K 4.0 07/08/2021 0937  ? CL 102 07/08/2021 0937  ? CO2 31 07/08/2021 0937  ? GLUCOSE 89 07/08/2021 0937  ? BUN 18 07/08/2021 0937  ? CREATININE 0.71 07/08/2021 0937  ? CALCIUM 9.2 07/08/2021 0937  ? GFRNONAA >60 07/08/2021 0937  ? GFRAA >60 10/04/2019 1412  ? ?Osteoporosis: Patient received her first Prolia back in July 2022.  She is not taking vitamin D.  She does take a calcium supplement.  She notes no fractures.  No side effects from the Prolia. ? ?Anxiety: Patient notes no anxiety.  Notes the medication has been beneficial.  She feels as though she would have anxiety if she was not taking Zoloft.  No depression. ? ?Chronic back pain: Patient had lumbar spine surgery last year.  She notes it was not beneficial.  She continues to have chronic issues with low back pain and left-sided sciatica.  She is holding off on any further intervention at this time given that she has to provide care for her husband who recently broke his hip.  She treats with Aspercreme and Tylenol. ? ?Social History  ? ?Tobacco Use  ?Smoking Status Former  ? Packs/day: 1.00  ? Years: 4.00  ? Pack years: 4.00  ? Types: Cigarettes  ? Quit date: 54  ? Years since quitting: 49.2  ?Smokeless Tobacco Never  ? ? ?Current Outpatient Medications on File Prior to Visit  ?Medication Sig Dispense Refill  ? B Complex-C (SUPER B COMPLEX PO) Take 1 tablet by mouth daily.    ? CALCIUM-MAGNESIUM-ZINC PO Take 1 tablet by mouth daily.    ? fexofenadine (ALLEGRA) 180 MG tablet TAKE ONE TABLET EVERY DAY 90 tablet 1  ? fluticasone (FLONASE) 50 MCG/ACT nasal spray TAKE 2 SPRAYS INTO BOTH NOSTRILS DAILY 16 g 1  ?  gabapentin (NEURONTIN) 300 MG capsule TAKE 1 CAPSULE BY MOUTH THREE TIMES DAILY 90 capsule 5  ? hydrochlorothiazide (MICROZIDE) 12.5 MG capsule TAKE 1 CAPSULE EVERY DAY 90 capsule 1  ? irbesartan (AVAPRO) 150 MG tablet Take 1 tablet (150 mg total) by mouth daily. 30 tablet 5  ? methimazole (TAPAZOLE) 5 MG tablet Take 2.5 mg by mouth at bedtime.    ? methocarbamol (ROBAXIN) 500 MG tablet Take 1 tablet (500 mg total) by mouth every 8 (eight) hours as needed for muscle spasms. 90 tablet 0  ? metoprolol succinate (TOPROL XL) 25 MG 24 hr tablet Take 1 tablet (25 mg total) by mouth daily. 30 tablet 5  ? montelukast (SINGULAIR) 10 MG tablet TAKE ONE TABLET BY MOUTH AT BEDTIME 90 tablet 1  ? Multiple Vitamin (MULTIVITAMIN) capsule Take 1 capsule by mouth daily.    ? senna (SENOKOT) 8.6 MG TABS tablet Take 1 tablet (8.6 mg total) by mouth daily as needed for mild constipation. 120 tablet 0  ? sertraline (ZOLOFT) 50 MG tablet TAKE 1 TABLET BY MOUTH DAILY 30 tablet 3  ? ?No current facility-administered medications on file prior to visit.  ? ? ? ?ROS see history of present illness ? ?Objective ? ?Physical Exam ?Vitals:  ? 01/27/22 0949  ?BP: 110/70  ?Pulse: (!) 58  ?  Temp: 98.4 ?F (36.9 ?C)  ?SpO2: 96%  ? ? ?BP Readings from Last 3 Encounters:  ?01/27/22 110/70  ?07/20/21 130/63  ?07/08/21 135/74  ? ?Wt Readings from Last 3 Encounters:  ?01/27/22 139 lb 12.8 oz (63.4 kg)  ?07/20/21 139 lb 5.3 oz (63.2 kg)  ?07/08/21 139 lb 5.3 oz (63.2 kg)  ? ? ?Physical Exam ?Constitutional:   ?   General: She is not in acute distress. ?   Appearance: She is not diaphoretic.  ?Cardiovascular:  ?   Rate and Rhythm: Normal rate and regular rhythm.  ?   Heart sounds: Normal heart sounds.  ?Pulmonary:  ?   Effort: Pulmonary effort is normal.  ?   Breath sounds: Normal breath sounds.  ?Musculoskeletal:  ?   Right lower leg: No edema.  ?   Left lower leg: No edema.  ?Skin: ?   General: Skin is warm and dry.  ?Neurological:  ?   Mental Status: She is  alert.  ? ? ? ?Assessment/Plan: Please see individual problem list. ? ?Problem List Items Addressed This Visit   ? ? Anxiety  ?  Well-controlled.  She will continue Zoloft 50 mg once daily. ?  ?  ? High serum vitamin D  ?  Recheck vitamin D. ?  ?  ? Relevant Orders  ? Vitamin D (25 hydroxy)  ? Hypertension  ?  Well-controlled.  She will continue hydrochlorothiazide 12.5 mg daily, irbesartan 150 mg daily, and metoprolol 25 mg daily. ?  ?  ? Relevant Orders  ? Comp Met (CMET)  ? Lipid panel  ? Osteoporosis  ?  She was given Prolia today.  We will check her vitamin D.  We will also check a calcium level. ?  ?  ? Relevant Orders  ? Vitamin D (25 hydroxy)  ? Comp Met (CMET)  ? Sciatica  ?  Chronic ongoing issue.  Surgical intervention was not beneficial.  At this time she will monitor at her request. ?  ?  ? ? ? ?Return in about 6 months (around 07/30/2022). ? ?This visit occurred during the SARS-CoV-2 public health emergency.  Safety protocols were in place, including screening questions prior to the visit, additional usage of staff PPE, and extensive cleaning of exam room while observing appropriate contact time as indicated for disinfecting solutions.  ? ? ?Tommi Rumps, MD ?[place] ? ?

## 2022-01-27 NOTE — Progress Notes (Signed)
Morgan Byrd presents today for injection per MD orders. ?Prolia injection  administered SQ in left Upper Arm. ?Administration without incident. ?Patient tolerated well.  Sherial Ebrahim,cma  ? ?

## 2022-01-27 NOTE — Patient Instructions (Signed)
Nice to see you. We will get lab work today and contact you with the results. 

## 2022-01-27 NOTE — Assessment & Plan Note (Signed)
Well-controlled.  She will continue hydrochlorothiazide 12.5 mg daily, irbesartan 150 mg daily, and metoprolol 25 mg daily. ?

## 2022-01-27 NOTE — Assessment & Plan Note (Signed)
Recheck vitamin D

## 2022-01-27 NOTE — Assessment & Plan Note (Signed)
Chronic ongoing issue.  Surgical intervention was not beneficial.  At this time she will monitor at her request. ?

## 2022-01-27 NOTE — Assessment & Plan Note (Signed)
She was given Prolia today.  We will check her vitamin D.  We will also check a calcium level. ?

## 2022-03-08 ENCOUNTER — Other Ambulatory Visit: Payer: Self-pay | Admitting: Family Medicine

## 2022-03-08 DIAGNOSIS — M170 Bilateral primary osteoarthritis of knee: Secondary | ICD-10-CM

## 2022-03-12 ENCOUNTER — Telehealth: Payer: Self-pay | Admitting: Family Medicine

## 2022-03-12 NOTE — Telephone Encounter (Signed)
LVM for patient to call back.   Jadarian Mckay,cma  

## 2022-03-12 NOTE — Telephone Encounter (Signed)
Pt called wanting to talk to cma about a problem she may think she have ?

## 2022-03-15 NOTE — Telephone Encounter (Signed)
I called and spoke with the patient and she stated she thinks she pulled a muscle and now the pain has subsided, I informed her that if she needs anything to call us back and she understood. Osias Resnick,cma  ?

## 2022-03-18 ENCOUNTER — Telehealth: Payer: Self-pay

## 2022-03-18 NOTE — Telephone Encounter (Signed)
Patient called to say she is having burning in her stomach and it hurts once in a while.  Patient states she had this a week or so ago and it has started back.  Patient states that when she has a bowel movement, she is going a little at the time but is not having diarrhea.  Patient states she is concerned that it may be a hernia. ? ?No appointments available.  Patient sent to access nurse. ?

## 2022-03-19 ENCOUNTER — Ambulatory Visit (INDEPENDENT_AMBULATORY_CARE_PROVIDER_SITE_OTHER): Payer: PPO | Admitting: Internal Medicine

## 2022-03-19 ENCOUNTER — Encounter: Payer: Self-pay | Admitting: Internal Medicine

## 2022-03-19 VITALS — BP 100/58 | HR 71 | Temp 98.4°F | Resp 14 | Ht 61.5 in | Wt 140.0 lb

## 2022-03-19 DIAGNOSIS — K5900 Constipation, unspecified: Secondary | ICD-10-CM

## 2022-03-19 DIAGNOSIS — E042 Nontoxic multinodular goiter: Secondary | ICD-10-CM | POA: Diagnosis not present

## 2022-03-19 DIAGNOSIS — E0590 Thyrotoxicosis, unspecified without thyrotoxic crisis or storm: Secondary | ICD-10-CM | POA: Diagnosis not present

## 2022-03-19 DIAGNOSIS — R935 Abnormal findings on diagnostic imaging of other abdominal regions, including retroperitoneum: Secondary | ICD-10-CM | POA: Diagnosis not present

## 2022-03-19 DIAGNOSIS — R1030 Lower abdominal pain, unspecified: Secondary | ICD-10-CM

## 2022-03-19 NOTE — Progress Notes (Addendum)
Chief Complaint  Patient presents with   Acute Visit    Pt c/o burning pain in lower abdomen that wakes her up in the morning. Sharp pain 2-3 times denies any recent episodes. Denies any N/V/D no uti sx's BM:not normal, stool pebble like    F/u  1. Right lower abdomen ab pain on and off x 2 weeks feels like burning in the am and sharp pain at times happens 2-3 x per day she does have hard balls of stool/peeble like stool coming out and takes colace, denies uti sxs for now   Of note husband of 59 years died he was 28 y.o and he was buried today    Review of Systems  Constitutional:  Negative for weight loss.  HENT:  Negative for hearing loss.   Eyes:  Negative for blurred vision.  Respiratory:  Negative for shortness of breath.   Cardiovascular:  Negative for chest pain.  Gastrointestinal:  Positive for abdominal pain and constipation. Negative for blood in stool.  Genitourinary:  Negative for dysuria.  Musculoskeletal:  Negative for falls and joint pain.  Skin:  Negative for rash.  Neurological:  Negative for headaches.  Psychiatric/Behavioral:  Negative for depression.   Past Medical History:  Diagnosis Date   Anxiety    Arthritis    C2 cervical fracture ([place]) 11/14/2019   Cancer ([place])    Complication of anesthesia    Heart murmur    History of closed fracture of nasal bones 10/11/2019   Hypertension    Lung nodule 04/05/2019   CT scan completed May 2020 recommends repeating in 12 months   Nasal fracture 11/14/2019   PONV (postoperative nausea and vomiting)    Past Surgical History:  Procedure Laterality Date   ABDOMINAL HYSTERECTOMY     BLADDER SUSPENSION     BREAST BIOPSY Right 1978   neg   COLONOSCOPY WITH PROPOFOL N/A 12/26/2015   Procedure: COLONOSCOPY WITH PROPOFOL;  Surgeon: Manya Silvas, MD;  Location: [facility] ENDOSCOPY;  Service: Endoscopy;  Laterality: N/A;   LUMBAR LAMINECTOMY/DECOMPRESSION MICRODISCECTOMY Left 07/20/2021   Procedure: OPEN L4/5 LEFT  HEMILAMINECTOMY & DISCECTOMY;  Surgeon: [name], MD;  Location: [facility] ORS;  Service: Neurosurgery;  Laterality: Left;   NOSE SURGERY     REPLACEMENT TOTAL KNEE BILATERAL     SHOULDER SURGERY     VAGINAL DELIVERY     2   Family History  Problem Relation Age of Onset   Dementia Mother    Arthritis Mother    Heart disease Father    Hypertension Father    Cancer Father        prostate   Diabetes Father    Diabetes Sister    Cancer Sister        breast   Breast cancer Sister 1   Heart disease Sister    Vasculitis Son    Kidney disease Son    Pneumonia Son    Breast cancer Cousin 10   Social History   Socioeconomic History   Marital status: Married    Spouse name: Not on file   Number of children: Not on file   Years of education: Not on file   Highest education level: Not on file  Occupational History   Not on file  Tobacco Use   Smoking status: Former    Packs/day: 1.00    Years: 4.00    Pack years: 4.00    Types: Cigarettes    Quit date: 1974  Years since quitting: 49.3   Smokeless tobacco: Never  Vaping Use   Vaping Use: Never used  Substance and Sexual Activity   Alcohol use: No   Drug use: No   Sexual activity: Never  Other Topics Concern   Not on file  Social History Narrative   Lives in [place] with husband. Son and daughter live nearby.      Work - retired Network engineer      Diet - healthy, regular   Exercise - housework   Social Determinants of Radio broadcast assistant Strain: Low Risk    Difficulty of Paying Living Expenses: Not hard at all  Food Insecurity: No Food Insecurity   Worried About Charity fundraiser in the Last Year: Never true   Arboriculturist in the Last Year: Never true  Transportation Needs: No Transportation Needs   Lack of Transportation (Medical): No   Lack of Transportation (Non-Medical): No  Physical Activity: Insufficiently Active   Days of Exercise per Week: 2 days   Minutes of Exercise per Session: 60 min   Stress: No Stress Concern Present   Feeling of Stress : Not at all  Social Connections: Unknown   Frequency of Communication with Friends and Family: Not on file   Frequency of Social Gatherings with Friends and Family: Not on file   Attends Religious Services: Not on Electrical engineer or Organizations: Not on file   Attends Archivist Meetings: Not on file   Marital Status: Married  Human resources officer Violence: Not At Risk   Fear of Current or Ex-Partner: No   Emotionally Abused: No   Physically Abused: No   Sexually Abused: No   Current Meds  Medication Sig   B Complex-C (SUPER B COMPLEX PO) Take 1 tablet by mouth daily.   CALCIUM-MAGNESIUM-ZINC PO Take 1 tablet by mouth daily.   fexofenadine (ALLEGRA) 180 MG tablet TAKE ONE TABLET EVERY DAY   fluticasone (FLONASE) 50 MCG/ACT nasal spray TAKE 2 SPRAYS INTO BOTH NOSTRILS DAILY   gabapentin (NEURONTIN) 300 MG capsule TAKE 1 CAPSULE BY MOUTH THREE TIMES DAILY   hydrochlorothiazide (MICROZIDE) 12.5 MG capsule TAKE 1 CAPSULE EVERY DAY   irbesartan (AVAPRO) 150 MG tablet Take 1 tablet (150 mg total) by mouth daily.   methimazole (TAPAZOLE) 5 MG tablet Take 2.5 mg by mouth at bedtime.   methocarbamol (ROBAXIN) 500 MG tablet Take 1 tablet (500 mg total) by mouth every 8 (eight) hours as needed for muscle spasms.   metoprolol succinate (TOPROL XL) 25 MG 24 hr tablet Take 1 tablet (25 mg total) by mouth daily.   montelukast (SINGULAIR) 10 MG tablet TAKE ONE TABLET BY MOUTH AT BEDTIME   Multiple Vitamin (MULTIVITAMIN) capsule Take 1 capsule by mouth daily.   senna (SENOKOT) 8.6 MG TABS tablet Take 1 tablet (8.6 mg total) by mouth daily as needed for mild constipation.   sertraline (ZOLOFT) 50 MG tablet TAKE 1 TABLET BY MOUTH DAILY   Allergies  Allergen Reactions   Codeine Nausea And Vomiting   Fosamax [Alendronate Sodium]     Couldn't walk   Lexapro [Escitalopram Oxalate]     Unknown reaction   Spiriva Respimat  [Tiotropium Bromide Monohydrate]     dizziness   Recent Results (from the past 2160 hour(s))  Vitamin D (25 hydroxy)     Status: None   Collection Time: 01/27/22 10:01 AM  Result Value Ref Range   VITD 59.80 30.00 -  100.00 ng/mL  Comp Met (CMET)     Status: None   Collection Time: 01/27/22 10:01 AM  Result Value Ref Range   Sodium 139 135 - 145 mEq/L   Potassium 3.8 3.5 - 5.1 mEq/L   Chloride 101 96 - 112 mEq/L   CO2 31 19 - 32 mEq/L   Glucose, Bld 95 70 - 99 mg/dL   BUN 20 6 - 23 mg/dL   Creatinine, Ser 0.78 0.40 - 1.20 mg/dL   Total Bilirubin 0.8 0.2 - 1.2 mg/dL   Alkaline Phosphatase 56 39 - 117 U/L   AST 26 0 - 37 U/L   ALT 18 0 - 35 U/L   Total Protein 7.4 6.0 - 8.3 g/dL   Albumin 4.3 3.5 - 5.2 g/dL   GFR 70.31 >60.00 mL/min    Comment: Calculated using the CKD-EPI Creatinine Equation (2021)   Calcium 10.0 8.4 - 10.5 mg/dL  Lipid panel     Status: Abnormal   Collection Time: 01/27/22 10:01 AM  Result Value Ref Range   Cholesterol 192 0 - 200 mg/dL    Comment: ATP III Classification       Desirable:  < 200 mg/dL               Borderline High:  200 - 239 mg/dL          High:  > = 240 mg/dL   Triglycerides 81.0 0.0 - 149.0 mg/dL    Comment: Normal:  <150 mg/dLBorderline High:  150 - 199 mg/dL   HDL 71.60 >39.00 mg/dL   VLDL 16.2 0.0 - 40.0 mg/dL   LDL Cholesterol 104 (H) 0 - 99 mg/dL   Total CHOL/HDL Ratio 3     Comment:                Men          Women1/2 Average Risk     3.4          3.3Average Risk          5.0          4.42X Average Risk          9.6          7.13X Average Risk          15.0          11.0                       NonHDL 120.23     Comment: NOTE:  Non-HDL goal should be 30 mg/dL higher than patient's LDL goal (i.e. LDL goal of < 70 mg/dL, would have non-HDL goal of < 100 mg/dL)   Objective  Body mass index is 26.02 kg/m. Wt Readings from Last 3 Encounters:  03/19/22 140 lb (63.5 kg)  01/27/22 139 lb 12.8 oz (63.4 kg)  07/20/21 139 lb 5.3 oz (63.2  kg)   Temp Readings from Last 3 Encounters:  03/19/22 98.4 F (36.9 C) (Oral)  01/27/22 98.4 F (36.9 C) (Oral)  07/20/21 (!) 97.3 F (36.3 C) (Temporal)   BP Readings from Last 3 Encounters:  03/19/22 (!) 100/58  01/27/22 110/70  07/20/21 130/63   Pulse Readings from Last 3 Encounters:  03/19/22 71  01/27/22 (!) 58  07/20/21 74    Physical Exam Vitals and nursing note reviewed.  Constitutional:      Appearance: Normal appearance. She is well-developed and well-groomed.  HENT:     Head:  Normocephalic and atraumatic.  Eyes:     Conjunctiva/sclera: Conjunctivae normal.     Pupils: Pupils are equal, round, and reactive to light.  Cardiovascular:     Rate and Rhythm: Normal rate and regular rhythm.     Heart sounds: Normal heart sounds. No murmur heard. Pulmonary:     Effort: Pulmonary effort is normal.     Breath sounds: Normal breath sounds.  Abdominal:     General: Abdomen is flat. Bowel sounds are normal.     Tenderness: There is abdominal tenderness in the right lower quadrant and left upper quadrant.     Comments: LUQ, LMQ RLQ  Mild ttp No rash to indicate shingles   Musculoskeletal:     Right hip: Tenderness present.       Legs:  Skin:    General: Skin is warm and dry.  Neurological:     General: No focal deficit present.     Mental Status: She is alert and oriented to person, place, and time. Mental status is at baseline.     Cranial Nerves: Cranial nerves 2-12 are intact.     Motor: Motor function is intact.     Coordination: Coordination is intact.     Gait: Gait is intact.  Psychiatric:        Attention and Perception: Attention and perception normal.        Mood and Affect: Mood and affect normal.        Speech: Speech normal.        Behavior: Behavior normal. Behavior is cooperative.        Thought Content: Thought content normal.        Cognition and Memory: Cognition and memory normal.        Judgment: Judgment normal.    Assessment  Plan   Lower abdominal pain  r/o GI etiology, MSK with moderate to severe spinal stenosis and with palpitation having right hip pain - Plan: Comprehensive metabolic panel, CBC with Differential/Platelet, Urinalysis, Routine w reflex microscopic, Urine Culture, CT ABDOMEN PELVIS W CONTRAST r/o internal abdominal etiology Prn Tylenol for now  She is taking gabapentin 2x per day asked her to try to increase to 3x per day with burning sensation RLQ  Subclinical hyperthyroidism - Plan: TSH, T4, free, T3, free Multinodular goiter - Plan: TSH, T4, free, T3, free  F/u kc endocrine   Constipation  Miralax  Colace  Warm prune juice  Abnormal CT scan 03/2022 ab/pelvis Referred [facility] IMPRESSION: 1. No CT evidence for acute intra-abdominal or pelvic abnormality. 2. Mild soft tissue thickening at the GE junction and gastric cardia, consider correlation with endoscopy. 3. Large stool burden without evidence for obstruction or wall thickening 4. Mild intra and extrahepatic biliary dilatation likely due to surgical change but correlate with LFTs.     Electronically Signed   By: Donavan Foil M.D.   On: 03/30/2022 20:03  Provider: Dr. Olivia Mackie McLean-Scocuzza-[facility]

## 2022-03-19 NOTE — Patient Instructions (Signed)
Increase gabapentin 3 x per day ? ?Abdominal Pain, Adult ?Pain in the abdomen (abdominal pain) can be caused by many things. Often, abdominal pain is not serious and it gets better with no treatment or by being treated at home. However, sometimes abdominal pain is serious. ?Your health care provider will ask questions about your medical history and do a physical exam to try to determine the cause of your abdominal pain. ?Follow these instructions at home: ?Medicines ?Take over-the-counter and prescription medicines only as told by your health care provider. ?Do not take a laxative unless told by your health care provider. ?General instructions ? ?Watch your condition for any changes. ?Drink enough fluid to keep your urine pale yellow. ?Keep all follow-up visits as told by your health care provider. This is important. ?Contact a health care provider if: ?Your abdominal pain changes or gets worse. ?You are not hungry or you lose weight without trying. ?You are constipated or have diarrhea for more than 2-3 days. ?You have pain when you urinate or have a bowel movement. ?Your abdominal pain wakes you up at night. ?Your pain gets worse with meals, after eating, or with certain foods. ?You are vomiting and cannot keep anything down. ?You have a fever. ?You have blood in your urine. ?Get help right away if: ?Your pain does not go away as soon as your health care provider told you to expect. ?You cannot stop vomiting. ?Your pain is only in areas of the abdomen, such as the right side or the left lower portion of the abdomen. Pain on the right side could be caused by appendicitis. ?You have bloody or black stools, or stools that look like tar. ?You have severe pain, cramping, or bloating in your abdomen. ?You have signs of dehydration, such as: ?Dark urine, very little urine, or no urine. ?Cracked lips. ?Dry mouth. ?Sunken eyes. ?Sleepiness. ?Weakness. ?You have trouble breathing or chest pain. ?Summary ?Often, abdominal  pain is not serious and it gets better with no treatment or by being treated at home. However, sometimes abdominal pain is serious. ?Watch your condition for any changes. ?Take over-the-counter and prescription medicines only as told by your health care provider. ?Contact a health care provider if your abdominal pain changes or gets worse. ?Get help right away if you have severe pain, cramping, or bloating in your abdomen. ?This information is not intended to replace advice given to you by your health care provider. Make sure you discuss any questions you have with your health care provider. ?Document Revised: 12/14/2019 Document Reviewed: 03/05/2019 ?Elsevier Patient Education ? [place]. ? ?

## 2022-03-20 LAB — CBC WITH DIFFERENTIAL/PLATELET
Absolute Monocytes: 453 cells/uL (ref 200–950)
Basophils Absolute: 68 cells/uL (ref 0–200)
Basophils Relative: 1.1 %
Eosinophils Absolute: 291 cells/uL (ref 15–500)
Eosinophils Relative: 4.7 %
HCT: 37.1 % (ref 35.0–45.0)
Hemoglobin: 12.4 g/dL (ref 11.7–15.5)
Lymphs Abs: 1984 cells/uL (ref 850–3900)
MCH: 32 pg (ref 27.0–33.0)
MCHC: 33.4 g/dL (ref 32.0–36.0)
MCV: 95.6 fL (ref 80.0–100.0)
MPV: 11.8 fL (ref 7.5–12.5)
Monocytes Relative: 7.3 %
Neutro Abs: 3404 cells/uL (ref 1500–7800)
Neutrophils Relative %: 54.9 %
Platelets: 259 10*3/uL (ref 140–400)
RBC: 3.88 10*6/uL (ref 3.80–5.10)
RDW: 12.1 % (ref 11.0–15.0)
Total Lymphocyte: 32 %
WBC: 6.2 10*3/uL (ref 3.8–10.8)

## 2022-03-20 LAB — COMPREHENSIVE METABOLIC PANEL
AG Ratio: 1.6 (calc) (ref 1.0–2.5)
ALT: 15 U/L (ref 6–29)
AST: 19 U/L (ref 10–35)
Albumin: 4.1 g/dL (ref 3.6–5.1)
Alkaline phosphatase (APISO): 66 U/L (ref 37–153)
BUN: 23 mg/dL (ref 7–25)
CO2: 26 mmol/L (ref 20–32)
Calcium: 8.6 mg/dL (ref 8.6–10.4)
Chloride: 103 mmol/L (ref 98–110)
Creat: 0.94 mg/dL (ref 0.60–0.95)
Globulin: 2.6 g/dL (calc) (ref 1.9–3.7)
Glucose, Bld: 107 mg/dL — ABNORMAL HIGH (ref 65–99)
Potassium: 3.8 mmol/L (ref 3.5–5.3)
Sodium: 140 mmol/L (ref 135–146)
Total Bilirubin: 0.5 mg/dL (ref 0.2–1.2)
Total Protein: 6.7 g/dL (ref 6.1–8.1)

## 2022-03-20 LAB — T4, FREE: Free T4: 1 ng/dL (ref 0.8–1.8)

## 2022-03-20 LAB — URINALYSIS, ROUTINE W REFLEX MICROSCOPIC
Bacteria, UA: NONE SEEN /HPF
Bilirubin Urine: NEGATIVE
Glucose, UA: NEGATIVE
Hgb urine dipstick: NEGATIVE
Hyaline Cast: NONE SEEN /LPF
Ketones, ur: NEGATIVE
Nitrite: NEGATIVE
RBC / HPF: NONE SEEN /HPF (ref 0–2)
Specific Gravity, Urine: 1.012 (ref 1.001–1.035)
Squamous Epithelial / HPF: NONE SEEN /HPF (ref <=5)
pH: 6.5 (ref 5.0–8.0)

## 2022-03-20 LAB — MICROSCOPIC MESSAGE

## 2022-03-20 LAB — TSH: TSH: 1.59 mIU/L (ref 0.40–4.50)

## 2022-03-20 LAB — T3, FREE: T3, Free: 3.2 pg/mL (ref 2.3–4.2)

## 2022-03-20 LAB — URINE CULTURE
MICRO NUMBER:: 13389177
SPECIMEN QUALITY:: ADEQUATE

## 2022-03-30 ENCOUNTER — Ambulatory Visit
Admission: RE | Admit: 2022-03-30 | Discharge: 2022-03-30 | Disposition: A | Discharge status: Home / Self Care | Payer: PPO | Source: Ambulatory Visit | Attending: Family Medicine | Admitting: Family Medicine

## 2022-03-30 DIAGNOSIS — R1030 Lower abdominal pain, unspecified: Secondary | ICD-10-CM | POA: Insufficient documentation

## 2022-03-30 DIAGNOSIS — R109 Unspecified abdominal pain: Secondary | ICD-10-CM | POA: Diagnosis not present

## 2022-03-30 MED ORDER — IOHEXOL 300 MG/ML  SOLN
80.0000 mL | Freq: Once | INTRAMUSCULAR | Status: AC | PRN
Start: 2022-03-30 — End: 2022-03-30
  Administered 2022-03-30: 80 mL via INTRAVENOUS

## 2022-03-31 NOTE — Addendum Note (Signed)
Addended by: Orland Mustard on: 03/31/2022 01:10 PM   Modules accepted: Orders

## 2022-04-12 ENCOUNTER — Ambulatory Visit (INDEPENDENT_AMBULATORY_CARE_PROVIDER_SITE_OTHER): Payer: PPO

## 2022-04-12 VITALS — Ht 61.5 in | Wt 140.0 lb

## 2022-04-12 DIAGNOSIS — Z0000 Encounter for general adult medical examination without abnormal findings: Secondary | ICD-10-CM

## 2022-04-12 NOTE — Patient Instructions (Addendum)
  Morgan Byrd , Thank you for taking time to come for your Medicare Wellness Visit. I appreciate your ongoing commitment to your health goals. Please review the following plan we discussed and let me know if I can assist you in the future.   These are the goals we discussed:  Goals       Patient Stated     Follow up with Provider (pt-stated)      As needed        This is a list of the screening recommended for you and due dates:  Health Maintenance  Topic Date Due   Flu Shot  06/08/2022   Tetanus Vaccine  10/03/2029   Pneumonia Vaccine  Completed   DEXA scan (bone density measurement)  Completed   COVID-19 Vaccine  Completed   Zoster (Shingles) Vaccine  Completed   HPV Vaccine  Aged Out

## 2022-04-12 NOTE — Progress Notes (Signed)
Subjective:   Morgan Byrd is a 86 y.o. female who presents for Medicare Annual (Subsequent) preventive examination.  Review of Systems    No ROS.  Medicare Wellness Virtual Visit.  Visual/audio telehealth visit, UTA vital signs.   See social history for additional risk factors.   Cardiac Risk Factors include: advanced age (>101mn, >>63women);hypertension     Objective:    Today's Vitals   04/12/22 0946  Weight: 140 lb (63.5 kg)  Height: 5' 1.5" (1.562 m)   Body mass index is 26.02 kg/m.     04/12/2022    9:55 AM 07/20/2021    9:01 AM 07/08/2021    9:14 AM 04/09/2021   12:54 PM 07/29/2020    1:22 PM 04/08/2020    1:55 PM 11/21/2019    4:44 PM  Advanced Directives  Does Patient Have a Medical Advance Directive? Yes Yes Yes Yes Yes Yes Yes  Type of AParamedicof AFort CoffeeLiving will HKetteringLiving will  [place]Living will Living will;Healthcare Power of AN[place]villeLiving will [place]Living will  Does patient want to make changes to medical advance directive? No - Patient declined No - Patient declined  No - Patient declined Yes (Inpatient - patient defers changing a medical advance directive and declines information at this time) No - Patient declined No - Patient declined  Copy of [place] Chart? No - copy requested No - copy requested  No - copy requested No - copy requested No - copy requested No - copy requested    Current Medications (verified) Outpatient Encounter Medications as of 04/12/2022  Medication Sig   B Complex-C (SUPER B COMPLEX PO) Take 1 tablet by mouth daily.   CALCIUM-MAGNESIUM-ZINC PO Take 1 tablet by mouth daily.   fexofenadine (ALLEGRA) 180 MG tablet TAKE ONE TABLET EVERY DAY   fluticasone (FLONASE) 50 MCG/ACT nasal spray TAKE 2 SPRAYS INTO BOTH NOSTRILS DAILY   gabapentin (NEURONTIN) 300 MG capsule TAKE 1 CAPSULE BY  MOUTH THREE TIMES DAILY   hydrochlorothiazide (MICROZIDE) 12.5 MG capsule TAKE 1 CAPSULE EVERY DAY   irbesartan (AVAPRO) 150 MG tablet Take 1 tablet (150 mg total) by mouth daily.   methimazole (TAPAZOLE) 5 MG tablet Take 2.5 mg by mouth at bedtime.   methocarbamol (ROBAXIN) 500 MG tablet Take 1 tablet (500 mg total) by mouth every 8 (eight) hours as needed for muscle spasms.   metoprolol succinate (TOPROL XL) 25 MG 24 hr tablet Take 1 tablet (25 mg total) by mouth daily.   montelukast (SINGULAIR) 10 MG tablet TAKE ONE TABLET BY MOUTH AT BEDTIME   Multiple Vitamin (MULTIVITAMIN) capsule Take 1 capsule by mouth daily.   senna (SENOKOT) 8.6 MG TABS tablet Take 1 tablet (8.6 mg total) by mouth daily as needed for mild constipation.   sertraline (ZOLOFT) 50 MG tablet TAKE 1 TABLET BY MOUTH DAILY   No facility-administered encounter medications on file as of 04/12/2022.    Allergies (verified) Codeine, Fosamax [alendronate sodium], Lexapro [escitalopram oxalate], and Spiriva respimat [tiotropium bromide monohydrate]   History: Past Medical History:  Diagnosis Date   Anxiety    Arthritis    [place]2 [place] 11[place]14[place]   Cancer (HCC)    Complication of anesthesia    Heart murmur    History of closed fracture of nasal bones 10/11/2019   Hypertension    Lung nodule 04/05/2019   CT scan completed May 2020  recommends repeating in 12 months   Nasal fracture 11/14/2019   PONV (postoperative nausea and vomiting)    Past Surgical History:  Procedure Laterality Date   ABDOMINAL HYSTERECTOMY     BLADDER SUSPENSION     BREAST BIOPSY Right 1978   neg   COLONOSCOPY WITH PROPOFOL N/A 12/26/2015   Procedure: COLONOSCOPY WITH PROPOFOL;  Surgeon: Manya Silvas, MD;  Location[facility];  Service: Endoscopy;  Laterality: N/A;   LUMBAR LAMINECTOMY/DECOMPRESSION MICRODISCECTOMY Left 07/20/2021   Procedure: OPEN L4/5 LEFT HEMILAMINECTOMY & DISCECTOMY;  Surgeon: [name], MD;  Location:  [facility] ORS;  Service: Neurosurgery;  Laterality: Left;   NOSE SURGERY     REPLACEMENT TOTAL KNEE BILATERAL     SHOULDER SURGERY     VAGINAL DELIVERY     2   Family History  Problem Relation Age of Onset   Dementia Mother    Arthritis Mother    Heart disease Father    Hypertension Father    Cancer Father        prostate   Diabetes Father    Diabetes Sister    Cancer Sister        breast   Breast cancer Sister 49   Heart disease Sister    Vasculitis Son    Kidney disease Son    Pneumonia Son    Breast cancer Cousin 43   Social History   Socioeconomic History   Marital status: Married    Spouse name: Not on file   Number of children: Not on file   Years of education: Not on file   Highest education level: Not on file  Occupational History   Not on file  Tobacco Use   Smoking status: Former    Packs/day: 1.00    Years: 4.00    Pack years: 4.00    Types: Cigarettes    Quit date: 1974    Years since quitting: 49.4   Smokeless tobacco: Never  Vaping Use   Vaping Use: Never used  Substance and Sexual Activity   Alcohol use: No   Drug use: No   Sexual activity: Never  Other Topics Concern   Not on file  Social History Narrative   Lives in [place]. Son and daughter live nearby.Work - retired Futures trader - healthy, Medical laboratory scientific officer - housework   Social Determinants of Radio broadcast assistant Strain: Low Risk    Difficulty of Paying Living Expenses: Not hard at all  Food Insecurity: No Food Insecurity   Worried About Charity fundraiser in the Last Year: Never true   Arboriculturist in the Last Year: Never true  Transportation Needs: No Transportation Needs   Lack of Transportation (Medical): No   Lack of Transportation (Non-Medical): No  Physical Activity: Insufficiently Active   Days of Exercise per Week: 2 days   Minutes of Exercise per Session: 60 min  Stress: No Stress Concern Present   Feeling of Stress : Not at all  Social Connections: Not on file    Tobacco Counseling Counseling given: Not Answered  Clinical Intake: Pre-visit preparation completed: Yes        Diabetes: No  How often do you need to have someone help you when you read instructions, pamphlets, or other written materials from your doctor or pharmacy?: 1 - Never   Interpreter Needed?: No     Activities of Daily Living    04/12/2022    9:56 AM 07/20/2021    8:59 AM  In your present state of health, do you have any difficulty performing the following activities:  Hearing? 0 0  Vision? 0 0  Difficulty concentrating or making decisions? 0 0  Walking or climbing stairs? 0 1  Dressing or bathing? 0 0  Doing errands, shopping? 0   Preparing Food and eating ? N   Using the Toilet? N   In the past six months, have you accidently leaked urine? N   Do you have problems with loss of bowel control? N   Managing your Medications? N   Managing your Finances? N   Housekeeping or managing your Housekeeping? N     Patient Care Team: Leone Haven, MD as PCP - General (Family Medicine) Kate Sable, MD as PCP - Cardiology (Cardiology)  Indicate any recent Medical Services you may have received from other than [facility] providers in the past year (date may be approximate).     Assessment:   This is a routine wellness examination for Mayte.  Virtual Visit via Telephone Note  I connected with  Kirwin on 04/12/22 at  9:45 AM EDT by telephone and verified that I am speaking with the correct person using two identifiers.  Persons participating in the virtual visit: [place].   I discussed the limitations of performing an evaluation and management service by telehealth. We continued and completed visit with audio only. Some vital signs may be absent or patient reported.   Hearing/Vision screen Hearing Screening - Comments:: Patient is able to hear conversational tones without difficulty. No issues reported. Vision Screening -  Comments:: Cataract extraction, bilateral  They have seen their ophthalmologist in the last 12 months.   Dietary issues and exercise activities discussed: Current Exercise Habits: Home exercise routine, Intensity: Mild Healthy diet Good water intake    Goals Addressed               This Visit's Progress     Patient Stated     Follow up with Provider (pt-stated)        As needed       Depression Screen    04/12/2022    9:54 AM 03/19/2022    3:04 PM 01/27/2022    9:51 AM 04/09/2021   12:50 PM 01/16/2021    1:53 PM 04/08/2020    1:47 PM 11/14/2019   10:01 AM  PHQ 2/9 Scores  PHQ - 2 Score 0 0 0 0 0 0 0    Fall Risk    04/12/2022    9:56 AM 03/19/2022    3:04 PM 01/27/2022    9:51 AM 04/09/2021   12:55 PM 01/16/2021    1:53 PM  [place] in the past year? 0 0 0 0 0  Number falls in past yr: 0 0 0 0 0  Injury with Fall?  0 0 0   Risk for fall due to :  No Fall Risks No Fall Risks    Follow up Falls evaluation completed Falls evaluation completed Falls evaluation completed Falls evaluation completed     [place]: Home free of loose throw rugs in walkways, pet beds, electrical cords, etc? Yes  Adequate lighting in your home to reduce risk of falls? Yes   ASSISTIVE DEVICES UTILIZED TO PREVENT FALLS: Use of a cane, walker or w/c? No  Grab bars in the bathroom? Yes  Shower chair or bench in shower? Yes  Elevated toilet seat or a handicapped  toilet? Yes   TIMED UP AND GO: Was the test performed? No .   Cognitive Function: Patient is alert and oriented x3.     02/14/2018    8:47 AM 02/10/2017    8:49 AM  MMSE - Mini Mental State Exam  Orientation to time 5 5  Orientation to Place 5 5  Registration 3 3  Attention/ Calculation 5 5  Recall 2 3  Language- name 2 objects 2 2  Language- repeat 1 1  Language- follow 3 step command 3 3  Language- read & follow direction 1 1  Write a sentence 1 1  Copy design 1 1  Total score 29 30         04/08/2020    1:52 PM 04/06/2019   11:44 AM  6CIT Screen  What Year? 0 points 0 points  What month? 0 points 0 points  What time? 0 points 0 points  Count back from 20 0 points 0 points  Months in reverse 0 points 0 points  Repeat phrase 0 points 0 points  Total Score 0 points 0 points    Immunizations Immunization History  Administered Date(s) Administered   Fluad Quad(high Dose 65+) 07/25/2019, 08/20/2021   Influenza Nasal 08/04/2020   Influenza, High Dose Seasonal PF 08/05/2016, 08/08/2017, 08/14/2018   Influenza,inj,Quad PF,6+ Mos 08/24/2014, 08/05/2015   Influenza-Unspecified 08/08/2012, 08/11/2013, 08/24/2014, 08/05/2015   PFIZER(Purple Top)SARS-COV-2 Vaccination 11/14/2019, 12/05/2019, 08/07/2020   Pfizer Covid-19 Vaccine Bivalent Booster 17yr & up 08/20/2021   Pneumococcal Conjugate-13 05/03/2014   Pneumococcal Polysaccharide-23 04/25/2005   Tdap 04/25/2012, 10/04/2019   Zoster Recombinat (Shingrix) 08/08/2017, 02/06/2018   Zoster, Live 06/26/2011   Screening Tests Health Maintenance  Topic Date Due   INFLUENZA VACCINE  06/08/2022   TETANUS/TDAP  10/03/2029   Pneumonia Vaccine 86 Years old  Completed   DEXA SCAN  Completed   COVID-19 Vaccine  Completed   Zoster Vaccines- Shingrix  Completed   HPV VACCINES  Aged Out   Health Maintenance There are no preventive care reminders to display for this patient.  Lung Cancer Screening: (Low Dose CT Chest recommended if Age 84-80years, 30 pack-year currently smoking OR have quit w/in 15years.)  does not qualify.   Hepatitis C Screening: does not qualify  Vision Screening: Recommended annual ophthalmology exams for early detection of glaucoma and other disorders of the eye.  Dental Screening: Recommended annual dental exams for proper oral hygiene  Community Resource Referral / Chronic Care Management: CRR required this visit?  No   CCM required this visit?  No      Plan:   Keep all routine  maintenance appointments.   I have personally reviewed and noted the following in the patient's chart:   Medical and social history Use of alcohol, tobacco or illicit drugs  Current medications and supplements including opioid prescriptions.  Functional ability and status Nutritional status Physical activity Advanced directives List of other physicians Hospitalizations, surgeries, and ER visits in previous 12 months Vitals Screenings to include cognitive, depression, and falls Referrals and appointments  In addition, I have reviewed and discussed with patient certain preventive protocols, quality metrics, and best practice recommendations. A written personalized care plan for preventive services as well as general preventive health recommendations were provided to patient.     OVarney Biles LPN   [id number]

## 2022-04-25 ENCOUNTER — Emergency Department: Payer: PPO

## 2022-04-25 ENCOUNTER — Encounter: Payer: Self-pay | Admitting: Medical Oncology

## 2022-04-25 ENCOUNTER — Emergency Department
Admission: EM | Admit: 2022-04-25 | Discharge: 2022-04-25 | Disposition: A | Discharge status: Home / Self Care | Payer: PPO | Attending: Emergency Medicine | Admitting: Emergency Medicine

## 2022-04-25 DIAGNOSIS — N3000 Acute cystitis without hematuria: Secondary | ICD-10-CM | POA: Insufficient documentation

## 2022-04-25 DIAGNOSIS — R197 Diarrhea, unspecified: Secondary | ICD-10-CM | POA: Insufficient documentation

## 2022-04-25 DIAGNOSIS — R7401 Elevation of levels of liver transaminase levels: Secondary | ICD-10-CM | POA: Insufficient documentation

## 2022-04-25 DIAGNOSIS — R531 Weakness: Secondary | ICD-10-CM | POA: Diagnosis not present

## 2022-04-25 DIAGNOSIS — E876 Hypokalemia: Secondary | ICD-10-CM | POA: Diagnosis not present

## 2022-04-25 LAB — CBC WITH DIFFERENTIAL/PLATELET
Abs Immature Granulocytes: 0.02 10*3/uL (ref 0.00–0.07)
Basophils Absolute: 0 10*3/uL (ref 0.0–0.1)
Basophils Relative: 0 %
Eosinophils Absolute: 0.2 10*3/uL (ref 0.0–0.5)
Eosinophils Relative: 3 %
HCT: 40.5 % (ref 36.0–46.0)
Hemoglobin: 13.1 g/dL (ref 12.0–15.0)
Immature Granulocytes: 0 %
Lymphocytes Relative: 31 %
Lymphs Abs: 2.1 10*3/uL (ref 0.7–4.0)
MCH: 30.5 pg (ref 26.0–34.0)
MCHC: 32.3 g/dL (ref 30.0–36.0)
MCV: 94.4 fL (ref 80.0–100.0)
Monocytes Absolute: 0.7 10*3/uL (ref 0.1–1.0)
Monocytes Relative: 11 %
Neutro Abs: 3.6 10*3/uL (ref 1.7–7.7)
Neutrophils Relative %: 55 %
Platelets: 248 10*3/uL (ref 150–400)
RBC: 4.29 MIL/uL (ref 3.87–5.11)
RDW: 13.2 % (ref 11.5–15.5)
WBC: 6.8 10*3/uL (ref 4.0–10.5)
nRBC: 0 % (ref 0.0–0.2)

## 2022-04-25 LAB — COMPREHENSIVE METABOLIC PANEL
ALT: 178 U/L — ABNORMAL HIGH (ref 0–44)
AST: 101 U/L — ABNORMAL HIGH (ref 15–41)
Albumin: 3.5 g/dL (ref 3.5–5.0)
Alkaline Phosphatase: 102 U/L (ref 38–126)
Anion gap: 10 (ref 5–15)
BUN: 14 mg/dL (ref 8–23)
CO2: 26 mmol/L (ref 22–32)
Calcium: 7.8 mg/dL — ABNORMAL LOW (ref 8.9–10.3)
Chloride: 101 mmol/L (ref 98–111)
Creatinine, Ser: 0.73 mg/dL (ref 0.44–1.00)
GFR, Estimated: >60 mL/min (ref >60)
Glucose, Bld: 99 mg/dL (ref 70–99)
Potassium: 3.2 mmol/L — ABNORMAL LOW (ref 3.5–5.1)
Sodium: 137 mmol/L (ref 135–145)
Total Bilirubin: 0.7 mg/dL (ref 0.3–1.2)
Total Protein: 7 g/dL (ref 6.5–8.1)

## 2022-04-25 LAB — URINALYSIS, ROUTINE W REFLEX MICROSCOPIC
Bilirubin Urine: NEGATIVE
Glucose, UA: NEGATIVE mg/dL
Ketones, ur: NEGATIVE mg/dL
Nitrite: POSITIVE — AB
Protein, ur: NEGATIVE mg/dL
Specific Gravity, Urine: 1.005 (ref 1.005–1.030)
WBC, UA: >50 WBC/hpf — ABNORMAL HIGH (ref 0–5)
pH: 7 (ref 5.0–8.0)

## 2022-04-25 MED ORDER — LACTATED RINGERS IV BOLUS
1000.0000 mL | Freq: Once | INTRAVENOUS | Status: AC
Start: 2022-04-25 — End: 2022-04-25
  Administered 2022-04-25: 1000 mL via INTRAVENOUS

## 2022-04-25 MED ORDER — SODIUM CHLORIDE 0.9 % IV SOLN
1.0000 g | Freq: Once | INTRAVENOUS | Status: AC
  Administered 2022-04-25: 1 g via INTRAVENOUS
  Filled 2022-04-25: qty 10

## 2022-04-25 MED ORDER — CEPHALEXIN 500 MG PO CAPS
500.0000 mg | ORAL_CAPSULE | Freq: Three times a day (TID) | ORAL | 0 refills | Status: AC
Start: 2022-04-25 — End: 2022-04-29

## 2022-04-25 MED ORDER — POTASSIUM CHLORIDE CRYS ER 20 MEQ PO TBCR
40.0000 meq | EXTENDED_RELEASE_TABLET | Freq: Once | ORAL | Status: AC
  Administered 2022-04-25: 40 meq via ORAL
  Filled 2022-04-25: qty 2

## 2022-04-25 NOTE — Discharge Instructions (Signed)
You are being discharged with a prescription for Keflex antibiotic to take 3 times daily for 4 days to treat a UTI/bladder infection.  This may be contributing to your diarrhea and feeling weak.  We gave you a dose of IV antibiotics while you are in the ED that would last until tomorrow (Monday 6/19), so please start taking the Keflex antibiotic tomorrow (Monday 6/19) and take at least 2 doses, continue 3 times daily after this.  Drink plenty of fluids.  If you develop any worsening symptoms despite this then please return to the ED.

## 2022-04-25 NOTE — ED Provider Triage Note (Signed)
Emergency Medicine Provider Triage Evaluation Note  Morgan Byrd , a 86 y.o. female  was evaluated in triage.  Pt complains of diarrhea, improving. Feeling weak still.  Review of Systems  Positive:  Negative:   Physical Exam  BP 140/60 (BP Location: Left Arm)   Pulse 62   Temp 97.8 F (36.6 C) (Oral)   Resp 18   SpO2 97%  Gen:   Awake, no distress   Resp:  Normal effort  MSK:   Moves extremities without difficulty  Other:    Medical Decision Making  Medically screening exam initiated at 9:44 AM.  Appropriate orders placed.  Morgan Byrd was informed that the remainder of the evaluation will be completed by another provider, this initial triage assessment does not replace that evaluation, and the importance of remaining in the ED until their evaluation is complete.     Vladimir Crofts, MD 04/25/22 [phone]

## 2022-04-25 NOTE — ED Notes (Signed)
Pt in bed, pt denies dizziness pt denies pain, states that she is feeling better.

## 2022-04-25 NOTE — ED Triage Notes (Signed)
Pt reports that she had diarrhea Thursday and Friday, states that the diarrhea has subsided but pt continues to feel weak. Pt denies pain.

## 2022-04-25 NOTE — ED Provider Notes (Signed)
[facility] Provider Note    Event Date/Time   First MD Initiated Contact with Patient 04/25/22 1204     (approximate)   History   Weakness   HPI  Morgan Byrd is a 86 y.o. female who presents to the ED for evaluation of Weakness   I reviewed PCP visit from 1 month ago where patient was evaluated for lower abdominal discomfort.  Had an outpatient CT scan that I reviewed without evidence of acute intra-abdominal pathology.  Treated for constipation.  Patient presents to the ED for evaluation of feeling weak after having diarrhea few days ago.  She reports Thursday and Friday she must of had 20-50 episodes of watery diarrhea.  Reports this clearing up and having a solid bowel movement this morning and no issues yesterday.  She reports feeling weak and generalized fashion since that time.  Never had abdominal pain with the diarrhea and denies any emesis.  No fever, chest pain, dysuria.  Denies any urinary frequency, no incontinence or changes to her urinary symptoms.   Physical Exam   Triage Vital Signs: ED Triage Vitals [04/25/22 0944]  Enc Vitals Group     BP 140/60     Pulse Rate 62     Resp 18     Temp 97.8 F (36.6 C)     Temp Source Oral     SpO2 97 %     Weight 138 lb (62.6 kg)     Height '5\' 1"'$  (1.549 m)     Head Circumference      Peak Flow      Pain Score 0     Pain Loc      Pain Edu?      Excl. in [place]?     Most recent vital signs: Vitals:   04/25/22 1330 04/25/22 1415  BP: 135/63 (!) 140/59  Pulse: 66 69  Resp:  18  Temp:  97.6 F (36.4 C)  SpO2: 98% 97%    General: Awake, no distress.  Pleasant and conversational.  Well-appearing. CV:  Good peripheral perfusion. RRR Resp:  Normal effort.  Abd:  No distention.  Soft and benign throughout MSK:  No deformity noted.  Neuro:  No focal deficits appreciated. Other:     ED Results / Procedures / Treatments   Labs (all labs ordered are listed, but only abnormal results are  displayed) Labs Reviewed  COMPREHENSIVE METABOLIC PANEL - Abnormal; Notable for the following components:      Result Value   Potassium 3.2 (*)    Calcium 7.8 (*)    AST 101 (*)    ALT 178 (*)    All other components within normal limits  URINALYSIS, ROUTINE W REFLEX MICROSCOPIC - Abnormal; Notable for the following components:   Color, Urine YELLOW (*)    APPearance HAZY (*)    Hgb urine dipstick SMALL (*)    Nitrite POSITIVE (*)    Leukocytes,Ua LARGE (*)    WBC, UA >50 (*)    Bacteria, UA FEW (*)    All other components within normal limits  URINE CULTURE  CBC WITH DIFFERENTIAL/PLATELET    EKG Sinus rhythm with a rate of 67 bpm.  Leftward axis and left bundle.  No STEMI by Sgar[place]ossa criteria.  RADIOLOGY   Official radiology report(s): No results found.  PROCEDURES and INTERVENTIONS:  .1-3 Lead EKG Interpretation  Performed by: Vladimir Crofts, MD Authorized by: Vladimir Crofts, MD     Interpretation: normal  ECG rate:  68   ECG rate assessment: normal     Rhythm: sinus rhythm     Ectopy: none     Conduction: normal     Medications  cefTRIAXone (ROCEPHIN) 1 g in sodium chloride 0.9 % 100 mL IVPB (has no administration in time range)  lactated ringers bolus 1,000 mL (0 mLs Intravenous Stopped 04/25/22 1413)  potassium chloride SA (KLOR-CON M) CR tablet 40 mEq (40 mEq Oral Given 04/25/22 1445)     IMPRESSION / MDM / ASSESSMENT AND PLAN / ED COURSE  I reviewed the triage vital signs and the nursing notes.  Differential diagnosis includes, but is not limited to, SBO, AKI, UTI, pyelonephritis, diverticulitis, gastroenteritis  {Patient presents with symptoms of an acute illness or injury that is potentially life-threatening.  86 year old female who is quite pleasant and functional presents with generalized weakness after diarrhea has resolved.  She has signs of acute cystitis on her work-up and is ultimately suitable for outpatient management.  Blood work is benign  with normal CBC.  Metabolic panel with mild hypokalemia that is repleted orally.  Minimal LFT derangements are noted and nonspecific.  No signs of hepatobiliary obstruction.  She is a benign RUQ.  I offered her a CT scan of her abdomen/pelvis but she declines indicating that she feels fine now and had a scan done a few weeks ago.  Her urine shows infectious features despite her lack of symptoms.  We will send for culture and empirically started on antibiotics as we await this culture.  She is requesting discharge and think outpatient management is reasonable.  We discussed return precautions.  Clinical Course as of 04/25/22 1459  Sun Apr 25, 2022  1303 Reassessed.  Feeling well.  No needs [DS]  1434 Reassessed.  Feeling well.  Ready to go home.  Awaiting urinalysis.  We discussed mild hypokalemia, following up with PCP and return precautions. [DS]    Clinical Course User Index [DS] Vladimir Crofts, MD     FINAL CLINICAL IMPRESSION(S) / ED DIAGNOSES   Final diagnoses:  Generalized weakness  Diarrhea, unspecified type  Hypokalemia  Acute cystitis without hematuria     Rx / DC Orders   ED Discharge Orders          Ordered    cephALEXin (KEFLEX) 500 MG capsule  3 times daily        04/25/22 1449             Note:  This document was prepared using Dragon voice recognition software and may include unintentional dictation errors.   Vladimir Crofts, MD 04/25/22 1501

## 2022-04-25 NOTE — ED Notes (Signed)
Pt in bed, pt states that last diarrhea was Friday, pt states she feels generally weak, abd soft and non tender.

## 2022-04-28 DIAGNOSIS — M79672 Pain in left foot: Secondary | ICD-10-CM | POA: Diagnosis not present

## 2022-04-28 DIAGNOSIS — M2142 Flat foot [pes planus] (acquired), left foot: Secondary | ICD-10-CM | POA: Diagnosis not present

## 2022-04-28 DIAGNOSIS — M19072 Primary osteoarthritis, left ankle and foot: Secondary | ICD-10-CM | POA: Diagnosis not present

## 2022-04-28 DIAGNOSIS — M898X9 Other specified disorders of bone, unspecified site: Secondary | ICD-10-CM | POA: Diagnosis not present

## 2022-04-28 LAB — URINE CULTURE: Culture: >=100000 — AB

## 2022-04-29 DIAGNOSIS — E042 Nontoxic multinodular goiter: Secondary | ICD-10-CM | POA: Diagnosis not present

## 2022-05-04 ENCOUNTER — Other Ambulatory Visit: Payer: Self-pay | Admitting: Family Medicine

## 2022-05-04 ENCOUNTER — Encounter: Payer: Self-pay | Admitting: Family Medicine

## 2022-05-04 ENCOUNTER — Ambulatory Visit (INDEPENDENT_AMBULATORY_CARE_PROVIDER_SITE_OTHER): Payer: PPO | Admitting: Family Medicine

## 2022-05-04 VITALS — BP 100/60 | HR 59 | Temp 98.4°F | Ht 61.0 in | Wt 139.2 lb

## 2022-05-04 DIAGNOSIS — N3000 Acute cystitis without hematuria: Secondary | ICD-10-CM

## 2022-05-04 DIAGNOSIS — E876 Hypokalemia: Secondary | ICD-10-CM | POA: Diagnosis not present

## 2022-05-04 DIAGNOSIS — N390 Urinary tract infection, site not specified: Secondary | ICD-10-CM | POA: Insufficient documentation

## 2022-05-04 LAB — COMPREHENSIVE METABOLIC PANEL
ALT: 71 U/L — ABNORMAL HIGH (ref 0–35)
AST: 40 U/L — ABNORMAL HIGH (ref 0–37)
Albumin: 3.8 g/dL (ref 3.5–5.2)
Alkaline Phosphatase: 100 U/L (ref 39–117)
BUN: 27 mg/dL — ABNORMAL HIGH (ref 6–23)
CO2: 28 mEq/L (ref 19–32)
Calcium: 8.9 mg/dL (ref 8.4–10.5)
Chloride: 96 mEq/L (ref 96–112)
Creatinine, Ser: 1.18 mg/dL (ref 0.40–1.20)
GFR: 42.7 mL/min — ABNORMAL LOW (ref >60.00)
Glucose, Bld: 144 mg/dL — ABNORMAL HIGH (ref 70–99)
Potassium: 3.5 mEq/L (ref 3.5–5.1)
Sodium: 134 mEq/L — ABNORMAL LOW (ref 135–145)
Total Bilirubin: 0.9 mg/dL (ref 0.2–1.2)
Total Protein: 7 g/dL (ref 6.0–8.3)

## 2022-05-04 MED ORDER — SULFAMETHOXAZOLE-TRIMETHOPRIM 800-160 MG PO TABS: 1.0000 | ORAL_TABLET | Freq: Two times a day (BID) | ORAL | 0 refills | Status: DC

## 2022-05-04 NOTE — Assessment & Plan Note (Signed)
Previous symptoms likely related to UTI.  She continues to have some urinary urgency and is difficult to tell if this is related to a chronic issue.  Given her symptoms that were occurring 3 yesterday we will treat with Bactrim 1 tablet twice daily for 3 days.  Discussed as long as she is feeling better there is no need to recheck her urine.  We will follow-up on her potassium today.  Liver enzymes are also seen to be elevated in the ED.  Possibly related to her diarrheal illness.  Recheck those as well.

## 2022-05-05 ENCOUNTER — Other Ambulatory Visit: Payer: Self-pay | Admitting: Family Medicine

## 2022-05-05 DIAGNOSIS — E042 Nontoxic multinodular goiter: Secondary | ICD-10-CM | POA: Diagnosis not present

## 2022-05-05 DIAGNOSIS — E0590 Thyrotoxicosis, unspecified without thyrotoxic crisis or storm: Secondary | ICD-10-CM | POA: Diagnosis not present

## 2022-05-05 DIAGNOSIS — R7989 Other specified abnormal findings of blood chemistry: Secondary | ICD-10-CM

## 2022-05-17 ENCOUNTER — Other Ambulatory Visit (INDEPENDENT_AMBULATORY_CARE_PROVIDER_SITE_OTHER): Payer: PPO

## 2022-05-17 DIAGNOSIS — R7989 Other specified abnormal findings of blood chemistry: Secondary | ICD-10-CM

## 2022-05-18 LAB — COMPREHENSIVE METABOLIC PANEL
ALT: 22 U/L (ref 0–35)
AST: 26 U/L (ref 0–37)
Albumin: 4.2 g/dL (ref 3.5–5.2)
Alkaline Phosphatase: 85 U/L (ref 39–117)
BUN: 24 mg/dL — ABNORMAL HIGH (ref 6–23)
CO2: 31 mEq/L (ref 19–32)
Calcium: 9.3 mg/dL (ref 8.4–10.5)
Chloride: 99 mEq/L (ref 96–112)
Creatinine, Ser: 0.93 mg/dL (ref 0.40–1.20)
GFR: 56.81 mL/min — ABNORMAL LOW (ref >60.00)
Glucose, Bld: 108 mg/dL — ABNORMAL HIGH (ref 70–99)
Potassium: 4 mEq/L (ref 3.5–5.1)
Sodium: 140 mEq/L (ref 135–145)
Total Bilirubin: 0.5 mg/dL (ref 0.2–1.2)
Total Protein: 7 g/dL (ref 6.0–8.3)

## 2022-06-01 ENCOUNTER — Other Ambulatory Visit: Payer: Self-pay | Admitting: Family Medicine

## 2022-06-01 DIAGNOSIS — G629 Polyneuropathy, unspecified: Secondary | ICD-10-CM

## 2022-06-09 ENCOUNTER — Other Ambulatory Visit: Payer: Self-pay

## 2022-06-09 MED ORDER — METOPROLOL SUCCINATE ER 25 MG PO TB24: 25.0000 mg | ORAL_TABLET | Freq: Every day | ORAL | 1 refills | Status: DC

## 2022-06-15 ENCOUNTER — Encounter: Payer: Self-pay | Admitting: Cardiology

## 2022-06-15 ENCOUNTER — Ambulatory Visit: Payer: PPO | Admitting: Cardiology

## 2022-06-15 VITALS — BP 108/56 | HR 53 | Ht 61.0 in | Wt 141.2 lb

## 2022-06-15 DIAGNOSIS — I8393 Asymptomatic varicose veins of bilateral lower extremities: Secondary | ICD-10-CM

## 2022-06-15 DIAGNOSIS — I10 Essential (primary) hypertension: Secondary | ICD-10-CM

## 2022-06-15 MED ORDER — IRBESARTAN 75 MG PO TABS: 75.0000 mg | ORAL_TABLET | Freq: Every day | ORAL | 5 refills | Status: DC

## 2022-06-15 NOTE — Patient Instructions (Signed)
Medication Instructions:  Your physician has recommended you make the following change in your medication:   DECREASE your irbesartan to 75 mg daily   *If you need a refill on your cardiac medications before your next appointment, please call your pharmacy*   Lab Work: None ordered  If you have labs (blood work) drawn today and your tests are completely normal, you will receive your results only by: [place] (if you have MyChart) OR A paper copy in the mail If you have any lab test that is abnormal or we need to change your treatment, we will call you to review the results.   Testing/Procedures: None ordered   Follow-Up: At [facility], you and your health needs are our priority.  As part of our continuing mission to provide you with exceptional heart care, we have created designated Provider Care Teams.  These Care Teams include your primary Cardiologist (physician) and Advanced Practice Providers (APPs -  Physician Assistants and Nurse Practitioners) who all work together to provide you with the care you need, when you need it.  We recommend signing up for the patient portal called "MyChart".  Sign up information is provided on this After Visit Summary.  MyChart is used to connect with patients for Virtual Visits (Telemedicine).  Patients are able to view lab/test results, encounter notes, upcoming appointments, etc.  Non-urgent messages can be sent to your provider as well.   To learn more about what you can do with MyChart, go to [email or web address].    Your next appointment:   3 month(s)  The format for your next appointment:   In Person  Provider:   You may see Kate Sable, MD or one of the following Advanced Practice Providers on your designated Care Team:   Murray Hodgkins, NP Christell Faith, PA-C Cadence Kathlen Mody, [place]   Other Instructions N/A  Important Information About Sugar

## 2022-06-15 NOTE — Progress Notes (Signed)
Cardiology Office Note:    Date:  06/15/2022   ID:  Morgan Byrd, DOB 01/08/1936, MRN [id number]  PCP:  Leone Haven, MD  Cardiologist:  Kate Sable, MD  Electrophysiologist:  None   Referring MD: Leone Haven, MD   Chief Complaint  Patient presents with   Follow-up    Patient states that she feels tightness in the lower back to upper abdominal area. 12 month follow up. Meds reviewed with patient.     History of Present Illness:    Morgan Byrd is a 86 y.o. female with a hx of hypertension who presents for follow-up.   Being seen for hypertension, medications previously adjusted.  Blood pressure has been adequately controlled, diastolics have been mildly low.  Recently lost her husband who had Parkinson's disease history.  Denies palpitations, compliant with medications as prescribed.  HCTZ was previously placed due to leg edema and varicose veins in addition to hypertension.   Prior notes echocardiogram was ordered which patient performed at [facility] on 10/11/2019.  Report available in care everywhere, but briefly normal left ventricular systolic function, normal right ventricular systolic function,  EF was 55%, trivial MR, mild TR.   Past Medical History:  Diagnosis Date   Anxiety    Arthritis    C2 cervical fracture ([place]) 11/14/2019   Cancer ([place])    Complication of anesthesia    Heart murmur    History of closed fracture of nasal bones 10/11/2019   Hypertension    Lung nodule 04/05/2019   CT scan completed May 2020 recommends repeating in 12 months   Nasal fracture 11/14/2019   PONV (postoperative nausea and vomiting)     Past Surgical History:  Procedure Laterality Date   ABDOMINAL HYSTERECTOMY     BLADDER SUSPENSION     BREAST BIOPSY Right 1978   neg   COLONOSCOPY WITH PROPOFOL N/A 12/26/2015   Procedure: COLONOSCOPY WITH PROPOFOL;  Surgeon: Manya Silvas, MD;  Location: [facility] ENDOSCOPY;  Service: Endoscopy;  Laterality: N/A;    LUMBAR LAMINECTOMY/DECOMPRESSION MICRODISCECTOMY Left 07/20/2021   Procedure: OPEN L4/5 LEFT HEMILAMINECTOMY & DISCECTOMY;  Surgeon: [name], MD;  Location: [facility] ORS;  Service: Neurosurgery;  Laterality: Left;   NOSE SURGERY     REPLACEMENT TOTAL KNEE BILATERAL     SHOULDER SURGERY     VAGINAL DELIVERY     2    Current Medications: Current Meds  Medication Sig   B Complex-C (SUPER B COMPLEX PO) Take 1 tablet by mouth daily.   CALCIUM-MAGNESIUM-ZINC PO Take 1 tablet by mouth daily.   diclofenac (VOLTAREN) 75 MG EC tablet Take 75 mg by mouth 2 (two) times daily as needed.   fexofenadine (ALLEGRA) 180 MG tablet TAKE ONE TABLET EVERY DAY   fluticasone (FLONASE) 50 MCG/ACT nasal spray TAKE 2 SPRAYS INTO BOTH NOSTRILS DAILY   gabapentin (NEURONTIN) 300 MG capsule TAKE 1 CAPSULE BY MOUTH THREE TIMES DAILY   hydrochlorothiazide (MICROZIDE) 12.5 MG capsule TAKE 1 CAPSULE EVERY DAY   methimazole (TAPAZOLE) 5 MG tablet Take 2.5 mg by mouth at bedtime.   metoprolol succinate (TOPROL XL) 25 MG 24 hr tablet Take 1 tablet (25 mg total) by mouth daily.   montelukast (SINGULAIR) 10 MG tablet TAKE ONE TABLET BY MOUTH AT BEDTIME   Multiple Vitamin (MULTIVITAMIN) capsule Take 1 capsule by mouth daily.   Omeprazole Magnesium (PRILOSEC PO)    sertraline (ZOLOFT) 50 MG tablet TAKE 1 TABLET BY MOUTH DAILY   [DISCONTINUED] irbesartan (  AVAPRO) 150 MG tablet Take 1 tablet (150 mg total) by mouth daily.     Allergies:   Codeine, Fosamax [alendronate sodium], Lexapro [escitalopram oxalate], and Spiriva respimat [tiotropium bromide monohydrate]   Social History   Socioeconomic History   Marital status: Married    Spouse name: Not on file   Number of children: Not on file   Years of education: Not on file   Highest education level: Not on file  Occupational History   Not on file  Tobacco Use   Smoking status: Former    Packs/day: 1.00    Years: 4.00    Total pack years: 4.00    Types: Cigarettes     Quit date: 43    Years since quitting: 49.6   Smokeless tobacco: Never  Vaping Use   Vaping Use: Never used  Substance and Sexual Activity   Alcohol use: No   Drug use: No   Sexual activity: Never  Other Topics Concern   Not on file  Social History Narrative   Lives in [place]. Son and daughter live nearby.Work - retired Futures trader - healthy, Medical laboratory scientific officer - housework   Social Determinants of Radio broadcast assistant Strain: [place]  (04/12/2022)   Overall Financial Resource Strain (CARDIA)    Difficulty of Paying Living Expenses: Not hard at all  Food Insecurity: No Food Insecurity (04/12/2022)   Hunger Vital Sign    Worried About Running Out of Food in the Last Year: Never true    [place] in the Last Year: Never true  Transportation Needs: No Transportation Needs (04/12/2022)   PRAPARE - Hydrologist (Medical): No    Lack of Transportation (Non-Medical): No  Physical Activity: Insufficiently Active (04/12/2022)   Exercise Vital Sign    Days of Exercise per Week: 2 days    Minutes of Exercise per Session: 60 min  Stress: No Stress Concern Present (04/12/2022)   [place]    Feeling of Stress : Not at all  Social Connections: Unknown (04/09/2021)   Social Connection and Isolation Panel [NHANES]    Frequency of Communication with Friends and Family: Not on file    Frequency of Social Gatherings with Friends and Family: Not on file    Attends Religious Services: Not on file    Active Member of Clubs or Organizations: Not on file    Attends Archivist Meetings: Not on file    Marital Status: Married     Family History: The patient's family history includes Arthritis in her mother; Breast cancer (age of onset: 56) in her cousin; Breast cancer (age of onset: 5) in her sister; Cancer in her father and sister; Dementia in her mother; Diabetes in her father and sister;  Heart disease in her father and sister; Hypertension in her father; Kidney disease in her son; Pneumonia in her son; Vasculitis in her son.  ROS:   Please see the history of present illness.     All other systems reviewed and are negative.  EKGs/Labs/Other Studies Reviewed:     EKG:  EKG is  ordered today.  The ekg ordered today demonstrates sinus bradycardia, heart rate 53, left bundle branch block.  Recent Labs: 03/19/2022: TSH 1.59 04/25/2022: Hemoglobin 13.1; Platelets 248 05/17/2022: ALT 22; BUN 24; Creatinine, Ser 0.93; Potassium 4.0; Sodium 140  Recent Lipid Panel    Component Value Date/Time   CHOL 192 01/27/2022 1001  TRIG 81.0 01/27/2022 1001   HDL 71.60 01/27/2022 1001   CHOLHDL 3 01/27/2022 1001   VLDL 16.2 01/27/2022 1001   LDLCALC 104 (H) 01/27/2022 1001   LDLDIRECT 131.8 04/30/2013 0915    Physical Exam:    VS:  BP (!) 108/56 (BP Location: Left Arm, Patient Position: Sitting, Cuff Size: Normal)   Pulse (!) 53   Ht '5\' 1"'$  (1.549 m)   Wt 141 lb 3.2 oz (64 kg)   SpO2 95%   BMI 26.68 kg/m     Wt Readings from Last 3 Encounters:  06/15/22 141 lb 3.2 oz (64 kg)  05/04/22 139 lb 3.2 oz (63.1 kg)  04/25/22 138 lb (62.6 kg)     GEN:  Well nourished, well developed in no acute distress HEENT: Normal NECK: No JVD; No carotid bruits LYMPHATICS: No lymphadenopathy CARDIAC: RRR, 4-6/9 systolic murmur at left sternal border, rubs, gallops RESPIRATORY:  Clear to auscultation without rales, wheezing or rhonchi  ABDOMEN: Soft, non-tender, non-distended MUSCULOSKELETAL:  No edema; varicose veins noted SKIN: Warm and dry NEUROLOGIC:  Alert and oriented x 3 PSYCHIATRIC:  Normal affect   ASSESSMENT:    1. Primary hypertension   2. Varicose veins of both lower extremities, unspecified whether complicated    PLAN:    In order of problems listed above:  Hypertension, diastolic BP slightly low, sinus bradycardia, heart rate 53 noted.  Reduce irbesartan to 75 mg  daily.  Continue HCTZ 12.5 mg daily.  Toprol-XL continued due to history of anxiety.  Consider reducing Toprol-XL at follow-up visit. Varicose veins, no edema, continue HCTZ as above.  Total encounter time more than 35 minutes  Greater than 50% was spent in counseling and coordination of care with the patient.   Follow-up in 3 months.  This note was generated in part or whole with voice recognition software. Voice recognition is usually quite accurate but there are transcription errors that can and very often do occur. I apologize for any typographical errors that were not detected and corrected.  Medication Adjustments/Labs and Tests Ordered: Current medicines are reviewed at length with the patient today.  Concerns regarding medicines are outlined above.  No orders of the defined types were placed in this encounter.   Meds ordered this encounter  Medications   irbesartan (AVAPRO) 75 MG tablet    Sig: Take 1 tablet (75 mg total) by mouth daily.    Dispense:  30 tablet    Refill:  5     Patient Instructions  Medication Instructions:  Your physician has recommended you make the following change in your medication:   DECREASE your irbesartan to 75 mg daily   *If you need a refill on your cardiac medications before your next appointment, please call your pharmacy*   Lab Work: None ordered  If you have labs (blood work) drawn today and your tests are completely normal, you will receive your results only by: [place] (if you have MyChart) OR A paper copy in the mail If you have any lab test that is abnormal or we need to change your treatment, we will call you to review the results.   Testing/Procedures: None ordered   Follow-Up: At [facility], you and your health needs are our priority.  As part of our continuing mission to provide you with exceptional heart care, we have created designated Provider Care Teams.  These Care Teams include your primary Cardiologist  (physician) and Advanced Practice Providers (APPs -  Physician Assistants and Nurse  Practitioners) who all work together to provide you with the care you need, when you need it.  We recommend signing up for the patient portal called "MyChart".  Sign up information is provided on this After Visit Summary.  MyChart is used to connect with patients for Virtual Visits (Telemedicine).  Patients are able to view lab/test results, encounter notes, upcoming appointments, etc.  Non-urgent messages can be sent to your provider as well.   To learn more about what you can do with MyChart, go to [email or web address].    Your next appointment:   3 month(s)  The format for your next appointment:   In Person  Provider:   You may see Kate Sable, MD or one of the following Advanced Practice Providers on your designated Care Team:   Murray Hodgkins, NP Christell Faith, PA-C Cadence Kathlen Mody, [place]   Other Instructions N/A  Important Information About Sugar         Signed, Kate Sable, MD  06/15/2022 10:58 AM    [place]

## 2022-06-16 DIAGNOSIS — R1033 Periumbilical pain: Secondary | ICD-10-CM | POA: Diagnosis not present

## 2022-06-16 DIAGNOSIS — S39011A Strain of muscle, fascia and tendon of abdomen, initial encounter: Secondary | ICD-10-CM | POA: Diagnosis not present

## 2022-06-17 NOTE — Addendum Note (Signed)
Addended by: James Ivanoff D on: 06/17/2022 02:33 PM   Modules accepted: Orders

## 2022-06-28 DIAGNOSIS — Z87898 Personal history of other specified conditions: Secondary | ICD-10-CM | POA: Diagnosis not present

## 2022-06-28 DIAGNOSIS — R933 Abnormal findings on diagnostic imaging of other parts of digestive tract: Secondary | ICD-10-CM | POA: Diagnosis not present

## 2022-06-28 DIAGNOSIS — R1310 Dysphagia, unspecified: Secondary | ICD-10-CM | POA: Diagnosis not present

## 2022-07-01 ENCOUNTER — Other Ambulatory Visit: Payer: Self-pay

## 2022-07-01 MED ORDER — IRBESARTAN 75 MG PO TABS: 75.0000 mg | ORAL_TABLET | Freq: Every day | ORAL | 5 refills | Status: DC

## 2022-07-05 DIAGNOSIS — H04123 Dry eye syndrome of bilateral lacrimal glands: Secondary | ICD-10-CM | POA: Diagnosis not present

## 2022-07-05 DIAGNOSIS — H5203 Hypermetropia, bilateral: Secondary | ICD-10-CM | POA: Diagnosis not present

## 2022-07-05 DIAGNOSIS — H02055 Trichiasis without entropian left lower eyelid: Secondary | ICD-10-CM | POA: Diagnosis not present

## 2022-07-05 DIAGNOSIS — H43811 Vitreous degeneration, right eye: Secondary | ICD-10-CM | POA: Diagnosis not present

## 2022-07-08 ENCOUNTER — Telehealth: Payer: Self-pay | Admitting: *Deleted

## 2022-07-08 NOTE — Telephone Encounter (Signed)
Late entry-Spoke with patient on 04/11/22 regarding Prolia.  Pt aware of $301 due on 08/02/22. Pt will get Prolia inj during office visit.  No PA Required

## 2022-07-29 ENCOUNTER — Other Ambulatory Visit: Payer: Self-pay | Admitting: Family Medicine

## 2022-07-30 ENCOUNTER — Ambulatory Visit: Payer: PPO | Admitting: Family Medicine

## 2022-08-02 ENCOUNTER — Ambulatory Visit (INDEPENDENT_AMBULATORY_CARE_PROVIDER_SITE_OTHER): Payer: PPO | Admitting: Family Medicine

## 2022-08-02 ENCOUNTER — Encounter: Payer: Self-pay | Admitting: Family Medicine

## 2022-08-02 VITALS — BP 124/72 | HR 52 | Temp 97.9°F | Ht 61.0 in | Wt 137.6 lb

## 2022-08-02 DIAGNOSIS — I10 Essential (primary) hypertension: Secondary | ICD-10-CM

## 2022-08-02 DIAGNOSIS — M65331 Trigger finger, right middle finger: Secondary | ICD-10-CM | POA: Diagnosis not present

## 2022-08-02 DIAGNOSIS — R1310 Dysphagia, unspecified: Secondary | ICD-10-CM | POA: Diagnosis not present

## 2022-08-02 DIAGNOSIS — M6530 Trigger finger, unspecified finger: Secondary | ICD-10-CM | POA: Insufficient documentation

## 2022-08-02 NOTE — Patient Instructions (Signed)
Orthopedic should call you to schedule evaluation for your trigger finger.  If you do not hear from them in the next couple of weeks please let us know.

## 2022-08-02 NOTE — Assessment & Plan Note (Signed)
Adequately controlled.  She will continue metoprolol 25 mg daily, HCTZ 12.5 mg daily, and irbesartan 75 mg daily.

## 2022-08-02 NOTE — Assessment & Plan Note (Signed)
Refer to orthopedics for further evaluation.

## 2022-08-02 NOTE — Assessment & Plan Note (Signed)
She will complete her evaluation with GI with an EGD in November.

## 2022-08-02 NOTE — Progress Notes (Signed)
Tommi Rumps, MD Phone: [phone]  Morgan Byrd is a 86 y.o. female who presents today for f/u.  HYPERTENSION Disease Monitoring Home BP Monitoring not checking Chest pain- no    Dyspnea- no Medications Compliance-  taking HCTZ, irbesartan, metoprolol.   Edema- no BMET    Component Value Date/Time   NA 140 05/17/2022 1436   K 4.0 05/17/2022 1436   CL 99 05/17/2022 1436   CO2 31 05/17/2022 1436   GLUCOSE 108 (H) 05/17/2022 1436   BUN 24 (H) 05/17/2022 1436   CREATININE 0.93 05/17/2022 1436   CREATININE 0.94 03/19/2022 1526   CALCIUM 9.3 05/17/2022 1436   GFRNONAA >60 04/25/2022 0945   GFRAA >60 10/04/2019 1412   Trigger finger: This has been present for 3 to 4 months.  It is her right middle finger.  Notes there is some soreness in her palm.  Notes she wakes up in the morning with her finger caught pointing down and she has to move it with her other hand to get it to release.  She is scheduled for an EGD in November given prior soft tissue thickening at the GE junction and gastric cardia on CT imaging.  GI started her on omeprazole.  She notes no reflux issues.  She does note the omeprazole may have helped with her dysphagia.  Social History   Tobacco Use  Smoking Status Former   Packs/day: 1.00   Years: 4.00   Total pack years: 4.00   Types: Cigarettes   Quit date: 1974   Years since quitting: 49.7  Smokeless Tobacco Never    Current Outpatient Medications on File Prior to Visit  Medication Sig Dispense Refill   B Complex-C (SUPER B COMPLEX PO) Take 1 tablet by mouth daily.     CALCIUM-MAGNESIUM-ZINC PO Take 1 tablet by mouth daily.     diclofenac (VOLTAREN) 75 MG EC tablet Take 75 mg by mouth 2 (two) times daily as needed.     fluticasone (FLONASE) 50 MCG/ACT nasal spray TAKE 2 SPRAYS INTO BOTH NOSTRILS DAILY 16 g 1   gabapentin (NEURONTIN) 300 MG capsule TAKE 1 CAPSULE BY MOUTH THREE TIMES DAILY 90 capsule 5   hydrochlorothiazide (MICROZIDE) 12.5 MG  capsule TAKE 1 CAPSULE EVERY DAY 90 capsule 1   irbesartan (AVAPRO) 75 MG tablet Take 1 tablet (75 mg total) by mouth daily. 30 tablet 5   methimazole (TAPAZOLE) 5 MG tablet Take 2.5 mg by mouth at bedtime.     metoprolol succinate (TOPROL XL) 25 MG 24 hr tablet Take 1 tablet (25 mg total) by mouth daily. 30 tablet 1   Multiple Vitamin (MULTIVITAMIN) capsule Take 1 capsule by mouth daily.     Omeprazole Magnesium (PRILOSEC PO)      sertraline (ZOLOFT) 50 MG tablet TAKE 1 TABLET BY MOUTH DAILY 30 tablet 3   No current facility-administered medications on file prior to visit.     ROS see history of present illness  Objective  Physical Exam Vitals:   08/02/22 0958  BP: 124/72  Pulse: (!) 52  Temp: 97.9 F (36.6 C)  SpO2: 98%    BP Readings from Last 3 Encounters:  08/02/22 124/72  06/15/22 (!) 108/56  05/04/22 100/60   Wt Readings from Last 3 Encounters:  08/02/22 137 lb 9.6 oz (62.4 kg)  06/15/22 141 lb 3.2 oz (64 kg)  05/04/22 139 lb 3.2 oz (63.1 kg)    Physical Exam Constitutional:      General: She is not in  acute distress.    Appearance: She is not diaphoretic.  Cardiovascular:     Rate and Rhythm: Normal rate and regular rhythm.     Heart sounds: Normal heart sounds.  Pulmonary:     Effort: Pulmonary effort is normal.     Breath sounds: Normal breath sounds.  Musculoskeletal:     Comments: Slight tenderness over the right palm in the distribution of the middle finger flexor tendon  Skin:    General: Skin is warm and dry.  Neurological:     Mental Status: She is alert.      Assessment/Plan: Please see individual problem list.  Problem List Items Addressed This Visit     Dysphagia - Primary (Chronic)    She will complete her evaluation with GI with an EGD in November.      Hypertension (Chronic)    Adequately controlled.  She will continue metoprolol 25 mg daily, HCTZ 12.5 mg daily, and irbesartan 75 mg daily.      Trigger finger    Refer to  orthopedics for further evaluation.      Relevant Orders   Ambulatory referral to Orthopedic Surgery     Return in about 6 months (around 01/31/2023).   Tommi Rumps, MD [place]

## 2022-08-04 ENCOUNTER — Telehealth: Payer: Self-pay | Admitting: *Deleted

## 2022-08-04 NOTE — Telephone Encounter (Signed)
I spoke with patient after Prolia was not given on scheduled date on 08/02/22. Pt stated that nobody mentioned it to her. I asked if she wanted to reschedule & after reminding her of the cost of the injection she declined at this time. Pt stated that she just had to pay out about $3000 for her dog & doesn't have it at this time.   Pt would have been responsible for a 20% coinsurance amount of $301 for Prolia.

## 2022-08-04 NOTE — Telephone Encounter (Signed)
Noted  

## 2022-08-05 ENCOUNTER — Telehealth (INDEPENDENT_AMBULATORY_CARE_PROVIDER_SITE_OTHER): Payer: PPO | Admitting: Nurse Practitioner

## 2022-08-05 ENCOUNTER — Other Ambulatory Visit: Payer: Self-pay | Admitting: *Deleted

## 2022-08-05 DIAGNOSIS — J0110 Acute frontal sinusitis, unspecified: Secondary | ICD-10-CM | POA: Insufficient documentation

## 2022-08-05 MED ORDER — METOPROLOL SUCCINATE ER 25 MG PO TB24: 25.0000 mg | ORAL_TABLET | Freq: Every day | ORAL | 2 refills | Status: DC

## 2022-08-05 MED ORDER — AMOXICILLIN-POT CLAVULANATE 875-125 MG PO TABS: 1.0000 | ORAL_TABLET | Freq: Two times a day (BID) | ORAL | 0 refills | Status: DC

## 2022-08-05 NOTE — Assessment & Plan Note (Addendum)
Acute, plan to treat with Augmentin twice a day x5 to 7 days.  Patient counseled to proceed to the emergency department if worsening symptoms occur especially if she experiences worsening swelling of the eye or ear, fever, or visual changes.  Patient also encouraged to take an at home COVID test to rule out acute COVID infection.  Patient reports understanding.

## 2022-08-05 NOTE — Progress Notes (Signed)
   Established Patient Office Visit  An audio-only tele-health visit was completed today for this patient. I connected with  Morgan Byrd on 08/05/22 utilizing audio-only technology and verified that I am speaking with the correct person using two identifiers. The patient was located at their home, and I was located at the office of [place] at [facility] during the encounter. I discussed the limitations of evaluation and management by telemedicine. The patient expressed understanding and agreed to proceed.     Subjective   Patient ID: Morgan Byrd, female    DOB: 12-Nov-1935  Age: 86 y.o. MRN: [id number]  Chief Complaint  Patient presents with   Sinusitis    Patient arrives a virtual visit for the above.  Reports symptom onset was 2 to 3 days ago, but has gotten progressively worse fairly quickly.  She is experiencing right face pain, pain in her teeth, and headache.  She denies any eye pain or visual changes.  She denies any redness or swelling around her ear or eye.  She reports having taken the medication for sinus infections in the past and feels this is consistent with sinus infection.  Has not tested for COVID-19.  Creatinine approximately 39 based on patient's last metabolic panel which was collected about 2 months ago using the Cockcroft-Gault creatinine clearance equation.    ROS : See HPI    Objective:     There were no vitals taken for this visit. BP Readings from Last 3 Encounters:  08/02/22 124/72  06/15/22 (!) 108/56  05/04/22 100/60   Wt Readings from Last 3 Encounters:  08/02/22 137 lb 9.6 oz (62.4 kg)  06/15/22 141 lb 3.2 oz (64 kg)  05/04/22 139 lb 3.2 oz (63.1 kg)      Physical Exam Comprehensive physical exam not completed today as office visit was conducted remotely.  Patient sounded well over the phone, she did not sound to be in any acute distress.  Patient was alert and oriented, and appeared to have appropriate judgment.   No results  found for any visits on 08/05/22.    The ASCVD Risk score (Arnett DK, et al., 2019) failed to calculate for the following reasons:   The 2019 ASCVD risk score is only valid for ages 38 to 63    Assessment & Plan:   Problem List Items Addressed This Visit       Respiratory   Acute non-recurrent frontal sinusitis - Primary    Acute, plan to treat with Augmentin twice a day x5 to 7 days.  Patient counseled to proceed to the emergency department if worsening symptoms occur especially if she experiences worsening swelling of the eye or ear, fever, or visual changes.  Patient also encouraged to take an at home COVID test to rule out acute COVID infection.  Patient reports understanding.      Relevant Medications   amoxicillin-clavulanate (AUGMENTIN) 875-125 MG tablet    Return if symptoms worsen or fail to improve.  Total time spent with telephone today was 7 minutes and 30 seconds.   Ailene Ards, NP

## 2022-08-24 DIAGNOSIS — M65331 Trigger finger, right middle finger: Secondary | ICD-10-CM | POA: Diagnosis not present

## 2022-08-31 ENCOUNTER — Other Ambulatory Visit: Payer: Self-pay | Admitting: Family Medicine

## 2022-09-06 ENCOUNTER — Other Ambulatory Visit: Payer: Self-pay | Admitting: Family Medicine

## 2022-09-06 DIAGNOSIS — M48061 Spinal stenosis, lumbar region without neurogenic claudication: Secondary | ICD-10-CM

## 2022-09-07 NOTE — Telephone Encounter (Signed)
I have not prescribed this medication in more than a year. Why has she requested a refill on this?

## 2022-09-08 DIAGNOSIS — D2272 Melanocytic nevi of left lower limb, including hip: Secondary | ICD-10-CM | POA: Diagnosis not present

## 2022-09-08 DIAGNOSIS — X32XXXA Exposure to sunlight, initial encounter: Secondary | ICD-10-CM | POA: Diagnosis not present

## 2022-09-08 DIAGNOSIS — D225 Melanocytic nevi of trunk: Secondary | ICD-10-CM | POA: Diagnosis not present

## 2022-09-08 DIAGNOSIS — L570 Actinic keratosis: Secondary | ICD-10-CM | POA: Diagnosis not present

## 2022-09-08 DIAGNOSIS — Z85828 Personal history of other malignant neoplasm of skin: Secondary | ICD-10-CM | POA: Diagnosis not present

## 2022-09-08 DIAGNOSIS — D2261 Melanocytic nevi of right upper limb, including shoulder: Secondary | ICD-10-CM | POA: Diagnosis not present

## 2022-09-21 ENCOUNTER — Ambulatory Visit: Payer: PPO | Attending: Cardiology | Admitting: Cardiology

## 2022-09-21 ENCOUNTER — Encounter: Payer: Self-pay | Admitting: Cardiology

## 2022-09-21 VITALS — BP 138/58 | HR 56 | Ht 61.0 in | Wt 144.0 lb

## 2022-09-21 DIAGNOSIS — I8393 Asymptomatic varicose veins of bilateral lower extremities: Secondary | ICD-10-CM

## 2022-09-21 DIAGNOSIS — I10 Essential (primary) hypertension: Secondary | ICD-10-CM | POA: Diagnosis not present

## 2022-09-21 NOTE — Progress Notes (Signed)
Cardiology Office Note:    Date:  09/21/2022   ID:  Morgan Byrd, DOB November 05, 1936, MRN [id number]  PCP:  Morgan Haven, MD  Cardiologist:  Morgan Sable, MD  Electrophysiologist:  None   Referring MD: Morgan Haven, MD   Chief Complaint  Patient presents with   Follow-up    HTN follow up, no new cardiac concerns     History of Present Illness:    Morgan Byrd is a 86 y.o. female with a hx of hypertension who presents for follow-up.   Previously seen for low bp and medication management. Irbesartan was reduced to '75mg'$  qd due to low bp. She states doing well on current dose of meds. She feels well, has no concerns at this time. Compliant with meds as prescribed.  Prior notes echocardiogram was ordered which patient performed at [facility] on 10/11/2019.  Report available in care everywhere, but briefly normal left ventricular systolic function, normal right ventricular systolic function,  EF was 55%, trivial MR, mild TR.   Past Medical History:  Diagnosis Date   Anxiety    Arthritis    C2 cervical fracture ([place]) 11/14/2019   Cancer ([place])    Complication of anesthesia    Heart murmur    History of closed fracture of nasal bones 10/11/2019   Hypertension    Lung nodule 04/05/2019   CT scan completed May 2020 recommends repeating in 12 months   Nasal fracture 11/14/2019   PONV (postoperative nausea and vomiting)     Past Surgical History:  Procedure Laterality Date   ABDOMINAL HYSTERECTOMY     BLADDER SUSPENSION     BREAST BIOPSY Right 1978   neg   COLONOSCOPY WITH PROPOFOL N/A 12/26/2015   Procedure: COLONOSCOPY WITH PROPOFOL;  Surgeon: Manya Silvas, MD;  Location: [facility] ENDOSCOPY;  Service: Endoscopy;  Laterality: N/A;   LUMBAR LAMINECTOMY/DECOMPRESSION MICRODISCECTOMY Left 07/20/2021   Procedure: OPEN L4/5 LEFT HEMILAMINECTOMY & DISCECTOMY;  Surgeon: [name], MD;  Location: [facility] ORS;  Service: Neurosurgery;  Laterality: Left;   NOSE  SURGERY     REPLACEMENT TOTAL KNEE BILATERAL     SHOULDER SURGERY     VAGINAL DELIVERY     2    Current Medications: Current Meds  Medication Sig   B Complex-C (SUPER B COMPLEX PO) Take 1 tablet by mouth daily.   CALCIUM-MAGNESIUM-ZINC PO Take 1 tablet by mouth daily.   diclofenac (VOLTAREN) 75 MG EC tablet TAKE 1 TABLET BY MOUTH TWICE DAILY AS NEEDED FOR MODERATE PAIN   gabapentin (NEURONTIN) 300 MG capsule TAKE 1 CAPSULE BY MOUTH THREE TIMES DAILY   hydrochlorothiazide (MICROZIDE) 12.5 MG capsule TAKE 1 CAPSULE EVERY DAY   irbesartan (AVAPRO) 75 MG tablet Take 1 tablet (75 mg total) by mouth daily.   methimazole (TAPAZOLE) 5 MG tablet Take 2.5 mg by mouth at bedtime.   metoprolol succinate (TOPROL XL) 25 MG 24 hr tablet Take 1 tablet (25 mg total) by mouth daily.   Multiple Vitamin (MULTIVITAMIN) capsule Take 1 capsule by mouth daily.   Omeprazole Magnesium (PRILOSEC PO)    sertraline (ZOLOFT) 50 MG tablet TAKE 1 TABLET BY MOUTH DAILY     Allergies:   Codeine, Fosamax [alendronate sodium], Lexapro [escitalopram oxalate], and Spiriva respimat [tiotropium bromide monohydrate]   Social History   Socioeconomic History   Marital status: Widowed    Spouse name: Not on file   Number of children: Not on file   Years of education: Not on  file   Highest education level: Not on file  Occupational History   Not on file  Tobacco Use   Smoking status: Former    Packs/day: 1.00    Years: 4.00    Total pack years: 4.00    Types: Cigarettes    Quit date: 61    Years since quitting: 49.9   Smokeless tobacco: Never  Vaping Use   Vaping Use: Never used  Substance and Sexual Activity   Alcohol use: No   Drug use: No   Sexual activity: Never  Other Topics Concern   Not on file  Social History Narrative   Lives in [place]. Son and daughter live nearby.Work - retired Futures trader - healthy, Medical laboratory scientific officer - housework   Social Determinants of Radio broadcast assistant Strain:  [place]  (04/12/2022)   Overall Financial Resource Strain (CARDIA)    Difficulty of Paying Living Expenses: Not hard at all  Food Insecurity: No Food Insecurity (04/12/2022)   Hunger Vital Sign    Worried About Running Out of Food in the Last Year: Never true    [place] in the Last Year: Never true  Transportation Needs: No Transportation Needs (04/12/2022)   PRAPARE - Hydrologist (Medical): No    Lack of Transportation (Non-Medical): No  Physical Activity: Insufficiently Active (04/12/2022)   Exercise Vital Sign    Days of Exercise per Week: 2 days    Minutes of Exercise per Session: 60 min  Stress: No Stress Concern Present (04/12/2022)   [place]    Feeling of Stress : Not at all  Social Connections: Unknown (04/09/2021)   Social Connection and Isolation Panel [NHANES]    Frequency of Communication with Friends and Family: Not on file    Frequency of Social Gatherings with Friends and Family: Not on file    Attends Religious Services: Not on file    Active Member of Clubs or Organizations: Not on file    Attends Archivist Meetings: Not on file    Marital Status: Married     Family History: The patient's family history includes Arthritis in her mother; Breast cancer (age of onset: 24) in her cousin; Breast cancer (age of onset: 92) in her sister; Cancer in her father and sister; Dementia in her mother; Diabetes in her father and sister; Heart disease in her father and sister; Hypertension in her father; Kidney disease in her son; Pneumonia in her son; Vasculitis in her son.  ROS:   Please see the history of present illness.     All other systems reviewed and are negative.  EKGs/Labs/Other Studies Reviewed:     EKG:  EKG not ordered today.    Recent Labs: 03/19/2022: TSH 1.59 04/25/2022: Hemoglobin 13.1; Platelets 248 05/17/2022: ALT 22; BUN 24; Creatinine, Ser 0.93;  Potassium 4.0; Sodium 140  Recent Lipid Panel    Component Value Date/Time   CHOL 192 01/27/2022 1001   TRIG 81.0 01/27/2022 1001   HDL 71.60 01/27/2022 1001   CHOLHDL 3 01/27/2022 1001   VLDL 16.2 01/27/2022 1001   LDLCALC 104 (H) 01/27/2022 1001   LDLDIRECT 131.8 04/30/2013 0915    Physical Exam:    VS:  BP (!) 138/58 (BP Location: Left Arm, Patient Position: Sitting, Cuff Size: Normal)   Pulse (!) 56   Ht '5\' 1"'$  (1.549 m)   Wt 144 lb (65.3 kg)   SpO2 97%  BMI 27.21 kg/m     Wt Readings from Last 3 Encounters:  09/21/22 144 lb (65.3 kg)  08/02/22 137 lb 9.6 oz (62.4 kg)  06/15/22 141 lb 3.2 oz (64 kg)     GEN:  Well nourished, well developed in no acute distress HEENT: Normal NECK: No JVD; No carotid bruits CARDIAC: RRR, 0-7/8 systolic murmur at left sternal border, rubs, gallops RESPIRATORY:  Clear to auscultation without rales, wheezing or rhonchi  ABDOMEN: Soft, non-tender, non-distended MUSCULOSKELETAL:  No edema; varicose veins noted SKIN: Warm and dry NEUROLOGIC:  Alert and oriented x 3 PSYCHIATRIC:  Normal affect   ASSESSMENT:    1. Primary hypertension   2. Varicose veins of both lower extremities, unspecified whether complicated    PLAN:    In order of problems listed above:  Hypertension, diastolic BP slightly low, sinus bradycardia, heart rate 53 noted.  Reduce irbesartan to 75 mg daily.  Continue HCTZ 12.5 mg daily.  Toprol-XL continued due to history of anxiety.  Consider reducing Toprol-XL at follow-up visit. Varicose veins, no edema, continue HCTZ as above.  Follow-up as needed.  This note was generated in part or whole with voice recognition software. Voice recognition is usually quite accurate but there are transcription errors that can and very often do occur. I apologize for any typographical errors that were not detected and corrected.  Medication Adjustments/Labs and Tests Ordered: Current medicines are reviewed at length with the  patient today.  Concerns regarding medicines are outlined above.  No orders of the defined types were placed in this encounter.   No orders of the defined types were placed in this encounter.    Patient Instructions  Medication Instructions:   Your physician recommends that you continue on your current medications as directed. Please refer to the Current Medication list given to you today.    Follow-Up: At [facility], you and your health needs are our priority.  As part of our continuing mission to provide you with exceptional heart care, we have created designated Provider Care Teams.  These Care Teams include your primary Cardiologist (physician) and Advanced Practice Providers (APPs -  Physician Assistants and Nurse Practitioners) who all work together to provide you with the care you need, when you need it.  We recommend signing up for the patient portal called "MyChart".  Sign up information is provided on this After Visit Summary.  MyChart is used to connect with patients for Virtual Visits (Telemedicine).  Patients are able to view lab/test results, encounter notes, upcoming appointments, etc.  Non-urgent messages can be sent to your provider as well.   To learn more about what you can do with MyChart, go to [email or web address].    Your next appointment:    Follow up as needed   The format for your next appointment:   In Person  Provider:   You may see Morgan Sable, MD or one of the following Advanced Practice Providers on your designated Care Team:   Murray Hodgkins, NP Christell Faith, PA-C Cadence Kathlen Mody, PA-C Gerrie Nordmann, NP    Other Instructions   Important Information About Sugar         Signed, Morgan Sable, MD  09/21/2022 10:04 AM    [place]

## 2022-09-21 NOTE — Patient Instructions (Signed)
Medication Instructions:   Your physician recommends that you continue on your current medications as directed. Please refer to the Current Medication list given to you today.    Follow-Up: At [facility], you and your health needs are our priority.  As part of our continuing mission to provide you with exceptional heart care, we have created designated Provider Care Teams.  These Care Teams include your primary Cardiologist (physician) and Advanced Practice Providers (APPs -  Physician Assistants and Nurse Practitioners) who all work together to provide you with the care you need, when you need it.  We recommend signing up for the patient portal called "MyChart".  Sign up information is provided on this After Visit Summary.  MyChart is used to connect with patients for Virtual Visits (Telemedicine).  Patients are able to view lab/test results, encounter notes, upcoming appointments, etc.  Non-urgent messages can be sent to your provider as well.   To learn more about what you can do with MyChart, go to [email or web address].    Your next appointment:    Follow up as needed   The format for your next appointment:   In Person  Provider:   You may see Kate Sable, MD or one of the following Advanced Practice Providers on your designated Care Team:   Murray Hodgkins, NP Christell Faith, PA-C Cadence Kathlen Mody, PA-C Gerrie Nordmann, NP    Other Instructions   Important Information About Sugar

## 2022-09-24 ENCOUNTER — Encounter: Payer: Self-pay | Admitting: Gastroenterology

## 2022-09-27 ENCOUNTER — Encounter: Payer: Self-pay | Admitting: Gastroenterology

## 2022-09-27 ENCOUNTER — Ambulatory Visit
Admission: RE | Admit: 2022-09-27 | Discharge: 2022-09-27 | Disposition: A | Discharge status: Home / Self Care | Payer: PPO | Attending: Gastroenterology | Admitting: Gastroenterology

## 2022-09-27 ENCOUNTER — Ambulatory Visit: Payer: PPO | Admitting: Certified Registered Nurse Anesthetist

## 2022-09-27 ENCOUNTER — Encounter
Admission: RE | Disposition: A | Discharge status: Home / Self Care | Payer: Self-pay | Source: Home / Self Care | Attending: Gastroenterology

## 2022-09-27 DIAGNOSIS — R933 Abnormal findings on diagnostic imaging of other parts of digestive tract: Secondary | ICD-10-CM | POA: Diagnosis not present

## 2022-09-27 DIAGNOSIS — K3189 Other diseases of stomach and duodenum: Secondary | ICD-10-CM | POA: Diagnosis not present

## 2022-09-27 DIAGNOSIS — R1310 Dysphagia, unspecified: Secondary | ICD-10-CM | POA: Diagnosis not present

## 2022-09-27 DIAGNOSIS — K2970 Gastritis, unspecified, without bleeding: Secondary | ICD-10-CM | POA: Insufficient documentation

## 2022-09-27 DIAGNOSIS — K222 Esophageal obstruction: Secondary | ICD-10-CM | POA: Diagnosis not present

## 2022-09-27 DIAGNOSIS — K2960 Other gastritis without bleeding: Secondary | ICD-10-CM | POA: Diagnosis not present

## 2022-09-27 DIAGNOSIS — Z87891 Personal history of nicotine dependence: Secondary | ICD-10-CM | POA: Diagnosis not present

## 2022-09-27 DIAGNOSIS — I10 Essential (primary) hypertension: Secondary | ICD-10-CM | POA: Insufficient documentation

## 2022-09-27 DIAGNOSIS — K224 Dyskinesia of esophagus: Secondary | ICD-10-CM | POA: Diagnosis not present

## 2022-09-27 HISTORY — PX: ESOPHAGOGASTRODUODENOSCOPY: SHX5428

## 2022-09-27 SURGERY — EGD (ESOPHAGOGASTRODUODENOSCOPY)
Anesthesia: General

## 2022-09-27 MED ORDER — LIDOCAINE HCL (CARDIAC) PF 100 MG/5ML IV SOSY
PREFILLED_SYRINGE | INTRAVENOUS | Status: DC | PRN
  Administered 2022-09-27: 50 mg via INTRAVENOUS

## 2022-09-27 MED ORDER — PROPOFOL 500 MG/50ML IV EMUL
INTRAVENOUS | Status: DC | PRN
  Administered 2022-09-27: 150 ug/kg/min via INTRAVENOUS

## 2022-09-27 MED ORDER — PROPOFOL 10 MG/ML IV BOLUS
INTRAVENOUS | Status: DC | PRN
  Administered 2022-09-27: 70 mg via INTRAVENOUS

## 2022-09-27 MED ORDER — SODIUM CHLORIDE 0.9 % IV SOLN: INTRAVENOUS | Status: DC

## 2022-09-27 NOTE — Anesthesia Procedure Notes (Signed)
Date/Time: 09/27/2022 10:50 AM  Performed by: Johnna Acosta, CRNAPre-anesthesia Checklist: Patient identified, Emergency Drugs available, Suction available, Patient being monitored and Timeout performed Patient Re-evaluated:Patient Re-evaluated prior to induction Oxygen Delivery Method: Nasal cannula Preoxygenation: Pre-oxygenation with 100% oxygen Induction Type: IV induction

## 2022-09-27 NOTE — Op Note (Signed)
[facility] Patient Name: Morgan Byrd Procedure Date: 09/27/2022 10:36 AM MRN: [id number] Account #: [id number] Date of Birth: Jul 06, 1936 Admit Type: Outpatient Age: 86 Room: [facility] ENDO ROOM 2 Gender: Female Note Status: Finalized Instrument Name: Upper Endoscope [id number] Procedure:             Upper GI endoscopy Indications:           Dysphagia, Abnormal CT of the GI tract Providers:             [name] DO, DO Medicines:             Monitored Anesthesia Care Complications:         No immediate complications. Estimated blood loss:                         Minimal. Procedure:             Pre-Anesthesia Assessment:                        - Prior to the procedure, a History and Physical was                         performed, and patient medications and allergies were                         reviewed. The patient is competent. The risks and                         benefits of the procedure and the sedation options and                         risks were discussed with the patient. All questions                         were answered and informed consent was obtained.                         Patient identification and proposed procedure were                         verified by the physician, the nurse, the anesthetist                         and the technician in the endoscopy suite. Mental                         Status Examination: alert and oriented. Airway                         Examination: normal oropharyngeal airway and neck                         mobility. Respiratory Examination: clear to                         auscultation. CV Examination: RRR, no murmurs, no S3                         or S4. Prophylactic  Antibiotics: The patient does not                         require prophylactic antibiotics. Prior                         Anticoagulants: The patient has taken no anticoagulant                         or antiplatelet agents.  ASA Grade Assessment: II - A                         patient with mild systemic disease. After reviewing                         the risks and benefits, the patient was deemed in                         satisfactory condition to undergo the procedure. The                         anesthesia plan was to use monitored anesthesia care                         (MAC). Immediately prior to administration of                         medications, the patient was re-assessed for adequacy                         to receive sedatives. The heart rate, respiratory                         rate, oxygen saturations, blood pressure, adequacy of                         pulmonary ventilation, and response to care were                         monitored throughout the procedure. The physical                         status of the patient was re-assessed after the                         procedure.                        After obtaining informed consent, the endoscope was                         passed under direct vision. Throughout the procedure,                         the patient's blood pressure, pulse, and oxygen                         saturations were monitored continuously. The Endoscope  was introduced through the mouth, and advanced to the                         second part of duodenum. The upper GI endoscopy was                         accomplished without difficulty. The patient tolerated                         the procedure well. Findings:      The duodenal bulb, first portion of the duodenum and second portion of       the duodenum were normal. Estimated blood loss: none.      Localized mild inflammation characterized by erosions and erythema was       found in the gastric antrum. Biopsies were taken with a cold forceps for       Helicobacter pylori testing. Estimated blood loss was minimal.      The exam of the stomach was otherwise normal.      The Z-line was regular.  Estimated blood loss: none.      Esophagogastric landmarks were identified: the gastroesophageal junction       was found at 33 cm from the incisors.      A widely patent Schatzki ring was found in the distal esophagus. The       scope was withdrawn. Dilation was performed with a Maloney dilator with       no resistance at 48 Fr and 52 Fr. The dilation site was examined       following endoscope reinsertion and showed no change. This was biopsied       with a cold forceps for disruption of schatzki ring. Estimated blood       loss was minimal.      Abnormal motility was noted in the esophagus. The cricopharyngeus was       normal. There is spasticity of the esophageal body. The distal       esophagus/lower esophageal sphincter is open. Tertiary peristaltic waves       are noted. Estimated blood loss: none.      The exam of the esophagus was otherwise normal.      No abnormality appreciated at the GEJ/Cardia that was previously       described on May 2023 CT Impression:            - Normal duodenal bulb, first portion of the duodenum                         and second portion of the duodenum.                        - Gastritis. Biopsied.                        - Z-line regular.                        - Esophagogastric landmarks identified.                        - Widely patent Schatzki ring. Dilated. Biopsied.                        -  Abnormal esophageal motility, suspicious for                         presbyesophagus. Recommendation:        - Patient has a contact number available for                         emergencies. The signs and symptoms of potential                         delayed complications were discussed with the patient.                         Return to normal activities tomorrow. Written                         discharge instructions were provided to the patient.                        - Discharge patient to home.                        - Resume previous diet.                         - Continue present medications.                        - No ibuprofen, naproxen, or other non-steroidal                         anti-inflammatory drugs.                        - Await pathology results.                        - Return to GI clinic as previously scheduled.                        - The findings and recommendations were discussed with                         the patient. Procedure Code(s):     --- Professional ---                        [phone], Esophagogastroduodenoscopy, flexible,                         transoral; with biopsy, single or multiple                        43450, Dilation of esophagus, by unguided sound or                         bougie, single or multiple passes Diagnosis Code(s):     --- Professional ---                        K29.70, Gastritis, unspecified, without bleeding  K22.2, Esophageal obstruction                        K22.4, Dyskinesia of esophagus                        R13.10, Dysphagia, unspecified                        R93.3, Abnormal findings on diagnostic imaging of                         other parts of digestive tract CPT copyright 2022 American Medical Association. All rights reserved. The codes documented in this report are preliminary and upon coder review may  be revised to meet current compliance requirements. Attending Participation:      I personally performed the entire procedure. Volney American, DO [name] DO, DO 09/27/2022 11:11:20 AM This report has been signed electronically. Number of Addenda: 0 Note Initiated On: 09/27/2022 10:36 AM Estimated Blood Loss:  Estimated blood loss was minimal.      [facility]

## 2022-09-27 NOTE — Transfer of Care (Signed)
Immediate Anesthesia Transfer of Care Note  Patient: Morgan Byrd  Procedure(s) Performed: ESOPHAGOGASTRODUODENOSCOPY (EGD)  Patient Location: PACU  Anesthesia Type:General  Level of Consciousness: sedated  Airway & Oxygen Therapy: Patient Spontanous Breathing  Post-op Assessment: Report given to RN and Post -op Vital signs reviewed and stable  Post vital signs: Reviewed and stable  Last Vitals:  Vitals Value Taken Time  BP 105/49 09/27/22 1109  Temp    Pulse 58 09/27/22 1109  Resp 23 09/27/22 1109  SpO2 93 % 09/27/22 1109    Last Pain:  Vitals:   09/27/22 1109  TempSrc:   PainSc: Asleep         Complications: No notable events documented.

## 2022-09-27 NOTE — Interval H&P Note (Signed)
History and Physical Interval Note: Preprocedure H&P from 09/27/22  was reviewed and there was no interval change after seeing and examining the patient.  Written consent was obtained from the patient after discussion of risks, benefits, and alternatives. Patient has consented to proceed with Esophagogastroduodenoscopy with possible intervention   09/27/2022 10:44 AM  Morgan Byrd  has presented today for surgery, with the diagnosis of Abnormal CT scan, gastrointestinal tract (R93.3).  The various methods of treatment have been discussed with the patient and family. After consideration of risks, benefits and other options for treatment, the patient has consented to  Procedure(s): ESOPHAGOGASTRODUODENOSCOPY (EGD) (N/A) as a surgical intervention.  The patient's history has been reviewed, patient examined, no change in status, stable for surgery.  I have reviewed the patient's chart and labs.  Questions were answered to the patient's satisfaction.     [name]

## 2022-09-27 NOTE — Anesthesia Preprocedure Evaluation (Signed)
Anesthesia Evaluation  Patient identified by MRN, date of birth, ID band Patient awake    Reviewed: Allergy & Precautions, NPO status , Patient's Chart, lab work & pertinent test results  History of Anesthesia Complications (+) PONV and history of anesthetic complications  Airway Mallampati: II  TM Distance: >3 FB Neck ROM: Limited    Dental  (+) Teeth Intact   Pulmonary neg pulmonary ROS, former smoker   Pulmonary exam normal breath sounds clear to auscultation       Cardiovascular Exercise Tolerance: Good hypertension, Pt. on medications negative cardio ROS Normal cardiovascular exam Rhythm:Regular     Neuro/Psych   Anxiety     negative neurological ROS  negative psych ROS   GI/Hepatic negative GI ROS, Neg liver ROS,,,  Endo/Other  negative endocrine ROS Hyperthyroidism   Renal/GU negative Renal ROS  negative genitourinary   Musculoskeletal   Abdominal Normal abdominal exam  (+)   Peds negative pediatric ROS (+)  Hematology negative hematology ROS (+)   Anesthesia Other Findings Past Medical History: No date: Anxiety No date: Arthritis 11/14/2019: C2 cervical fracture ([facility]) No date: Cancer ([place]) No date: Complication of anesthesia No date: Heart murmur 10/11/2019: History of closed fracture of nasal bones No date: Hypertension 04/05/2019: Lung nodule     Comment:  CT scan completed May 2020 recommends repeating in 12               months 11/14/2019: Nasal fracture No date: PONV (postoperative nausea and vomiting)  Past Surgical History: No date: ABDOMINAL HYSTERECTOMY No date: BLADDER SUSPENSION 1978: BREAST BIOPSY; Right     Comment:  neg 12/26/2015: COLONOSCOPY WITH PROPOFOL; N/A     Comment:  Procedure: COLONOSCOPY WITH PROPOFOL;  Surgeon: Manya Silvas, MD;  Location[facility] ENDOSCOPY;  Service:               Endoscopy;  Laterality: N/A; 07/20/2021: LUMBAR LAMINECTOMY/DECOMPRESSION  MICRODISCECTOMY; Left     Comment:  Procedure: OPEN L4/5 LEFT HEMILAMINECTOMY & DISCECTOMY;               Surgeon: [name], MD;  Location: [facility] ORS;  Service:              Neurosurgery;  Laterality: Left; No date: NOSE SURGERY No date: REPLACEMENT TOTAL KNEE BILATERAL No date: SHOULDER SURGERY No date: VAGINAL DELIVERY     Comment:  2  BMI    Body Mass Index: 25.89 kg/m      Reproductive/Obstetrics negative OB ROS                             Anesthesia Physical Anesthesia Plan  ASA: 2  Anesthesia Plan: General   Post-op Pain Management:    Induction: Intravenous  PONV Risk Score and Plan: Propofol infusion and TIVA  Airway Management Planned: Natural Airway  Additional Equipment:   Intra-op Plan:   Post-operative Plan:   Informed Consent: I have reviewed the patients History and Physical, chart, labs and discussed the procedure including the risks, benefits and alternatives for the proposed anesthesia with the patient or authorized representative who has indicated his/her understanding and acceptance.     Dental Advisory Given  Plan Discussed with: CRNA and Surgeon  Anesthesia Plan Comments:        Anesthesia Quick Evaluation

## 2022-09-27 NOTE — H&P (Signed)
Pre-Procedure H&P   Patient ID: Morgan Byrd is a 86 y.o. female.  Gastroenterology Provider: [name], DO  Referring Provider: Dawson Bills, NP PCP: Leone Haven, MD  Date: 09/27/2022  HPI Morgan Byrd is a 86 y.o. female who presents today for Esophagogastroduodenoscopy for dysphagia; abnormal ct .  Patient with dysphagia and abdominal discomfort.  She underwent her last EGD in December 2013 which demonstrated duodenitis and gastritis.  She also a normal esophagus which was dilated with a 19 [place] savary which she noted improvement of dysphagia with this.  She underwent a CT scan in March 2023 demonstrating tissue thickening at the GEJ in the gastric cardia  Currently she denies any reflux symptoms dysphagia odynophagia nausea vomiting appetite changes or weight loss.  Currently on Prilosec  Most recent hemoglobin 13.1 MCV 94 platelets 1 40,000 creatinine 1.2.  Past Medical History:  Diagnosis Date   Anxiety    Arthritis    C2 cervical fracture ([place]) 11/14/2019   Cancer ([place])    Complication of anesthesia    Heart murmur    History of closed fracture of nasal bones 10/11/2019   Hypertension    Lung nodule 04/05/2019   CT scan completed May 2020 recommends repeating in 12 months   Nasal fracture 11/14/2019   PONV (postoperative nausea and vomiting)     Past Surgical History:  Procedure Laterality Date   ABDOMINAL HYSTERECTOMY     BLADDER SUSPENSION     BREAST BIOPSY Right 1978   neg   COLONOSCOPY WITH PROPOFOL N/A 12/26/2015   Procedure: COLONOSCOPY WITH PROPOFOL;  Surgeon: Manya Silvas, MD;  Location: [facility] ENDOSCOPY;  Service: Endoscopy;  Laterality: N/A;   LUMBAR LAMINECTOMY/DECOMPRESSION MICRODISCECTOMY Left 07/20/2021   Procedure: OPEN L4/5 LEFT HEMILAMINECTOMY & DISCECTOMY;  Surgeon: [name], MD;  Location: [facility] ORS;  Service: Neurosurgery;  Laterality: Left;   NOSE SURGERY     REPLACEMENT TOTAL KNEE BILATERAL     SHOULDER  SURGERY     VAGINAL DELIVERY     2    Family History Sister with history of colon polyps No other h/o GI disease or malignancy  Review of Systems  Constitutional:  Negative for activity change, appetite change, chills, diaphoresis, fatigue, fever and unexpected weight change.  HENT:  Positive for trouble swallowing. Negative for voice change.   Respiratory:  Negative for shortness of breath and wheezing.   Cardiovascular:  Negative for chest pain, palpitations and leg swelling.  Gastrointestinal:  Negative for abdominal distention, abdominal pain, anal bleeding, blood in stool, constipation, diarrhea, nausea, rectal pain and vomiting.  Musculoskeletal:  Negative for arthralgias and myalgias.  Skin:  Negative for color change and pallor.  Neurological:  Negative for dizziness, syncope and weakness.  Psychiatric/Behavioral:  Negative for confusion.   All other systems reviewed and are negative.    Medications No current facility-administered medications on file prior to encounter.   Current Outpatient Medications on File Prior to Encounter  Medication Sig Dispense Refill   B Complex-C (SUPER B COMPLEX PO) Take 1 tablet by mouth daily.     CALCIUM-MAGNESIUM-ZINC PO Take 1 tablet by mouth daily.     fluticasone (FLONASE) 50 MCG/ACT nasal spray TAKE 2 SPRAYS INTO BOTH NOSTRILS DAILY 16 g 1   gabapentin (NEURONTIN) 300 MG capsule TAKE 1 CAPSULE BY MOUTH THREE TIMES DAILY 90 capsule 5   methimazole (TAPAZOLE) 5 MG tablet Take 2.5 mg by mouth at bedtime.     Multiple  Vitamin (MULTIVITAMIN) capsule Take 1 capsule by mouth daily.     Omeprazole Magnesium (PRILOSEC PO)       Pertinent medications related to GI and procedure were reviewed by me with the patient prior to the procedure   Current Facility-Administered Medications:    0.9 %  sodium chloride infusion, , Intravenous, Continuous, [name], DO, Last Rate: 20 mL/hr at 09/27/22 1030, New Bag at 09/27/22 1030  sodium  chloride 20 mL/hr at 09/27/22 1030       Allergies  Allergen Reactions   Codeine Nausea And Vomiting   Fosamax [Alendronate Sodium]     Couldn't walk   Lexapro [Escitalopram Oxalate]     Unknown reaction   Spiriva Respimat [Tiotropium Bromide Monohydrate]     dizziness   Allergies were reviewed by me prior to the procedure  Objective   Body mass index is 25.89 kg/m. Vitals:   09/27/22 1021  BP: (!) 163/81  Pulse: 60  Resp: 18  Temp: (!) 97.3 F (36.3 C)  TempSrc: Tympanic  SpO2: 100%  Weight: 62.1 kg  Height: '5\' 1"'$  (1.549 m)     Physical Exam Vitals and nursing note reviewed.  Constitutional:      General: She is not in acute distress.    Appearance: Normal appearance. She is not ill-appearing, toxic-appearing or diaphoretic.  HENT:     Head: Normocephalic and atraumatic.     Nose: Nose normal.     Mouth/Throat:     Mouth: Mucous membranes are moist.     Pharynx: Oropharynx is clear.  Eyes:     General: No scleral icterus.    Extraocular Movements: Extraocular movements intact.  Cardiovascular:     Rate and Rhythm: Normal rate and regular rhythm.     Heart sounds: Normal heart sounds. No murmur heard.    No friction rub. No gallop.  Pulmonary:     Effort: Pulmonary effort is normal. No respiratory distress.     Breath sounds: Normal breath sounds. No wheezing, rhonchi or rales.  Abdominal:     General: Bowel sounds are normal. There is no distension.     Palpations: Abdomen is soft.     Tenderness: There is no abdominal tenderness. There is no guarding or rebound.  Musculoskeletal:     Cervical back: Neck supple.     Right lower leg: No edema.     Left lower leg: No edema.  Skin:    General: Skin is warm and dry.     Coloration: Skin is not jaundiced or pale.  Neurological:     General: No focal deficit present.     Mental Status: She is alert and oriented to person, place, and time. Mental status is at baseline.  Psychiatric:        Mood and  Affect: Mood normal.        Behavior: Behavior normal.        Thought Content: Thought content normal.        Judgment: Judgment normal.      Assessment:  Morgan Byrd is a 86 y.o. female  who presents today for Esophagogastroduodenoscopy for dysphagia; abnormal ct .  Plan:  Esophagogastroduodenoscopy with possible intervention today  Esophagogastroduodenoscopy with possible biopsy, control of bleeding, polypectomy, and interventions as necessary has been discussed with the patient/patient representative. Informed consent was obtained from the patient/patient representative after explaining the indication, nature, and risks of the procedure including but not limited to death, bleeding, perforation, missed neoplasm/lesions,  cardiorespiratory compromise, and reaction to medications. Opportunity for questions was given and appropriate answers were provided. Patient/patient representative has verbalized understanding is amenable to undergoing the procedure.   [name], DO  [facility]  Portions of the record may have been created with voice recognition software. Occasional wrong-word or 'sound-a-like' substitutions may have occurred due to the inherent limitations of voice recognition software.  Read the chart carefully and recognize, using context, where substitutions may have occurred.

## 2022-09-27 NOTE — Anesthesia Postprocedure Evaluation (Signed)
Anesthesia Post Note  Patient: Morgan Byrd Record  Procedure(s) Performed: ESOPHAGOGASTRODUODENOSCOPY (EGD)  Anesthesia Type: General Anesthetic complications: no   No notable events documented.   Last Vitals:  Vitals:   09/27/22 1119 09/27/22 1129  BP: (!) 125/55 (!) 130/59  Pulse: (!) 59 60  Resp: 16 18  Temp:    SpO2: 94% 95%    Last Pain:  Vitals:   09/27/22 1129  TempSrc:   PainSc: 0-No pain                 VAN STAVEREN,Danaria Larsen

## 2022-09-28 ENCOUNTER — Encounter: Payer: Self-pay | Admitting: Gastroenterology

## 2022-09-29 LAB — SURGICAL PATHOLOGY

## 2022-11-10 ENCOUNTER — Other Ambulatory Visit: Payer: Self-pay

## 2022-11-10 MED ORDER — METOPROLOL SUCCINATE ER 25 MG PO TB24: 25.0000 mg | ORAL_TABLET | Freq: Every day | ORAL | 5 refills | Status: DC

## 2022-12-09 DIAGNOSIS — M65331 Trigger finger, right middle finger: Secondary | ICD-10-CM | POA: Diagnosis not present

## 2022-12-09 DIAGNOSIS — G5601 Carpal tunnel syndrome, right upper limb: Secondary | ICD-10-CM | POA: Diagnosis not present

## 2023-01-20 ENCOUNTER — Other Ambulatory Visit: Payer: Self-pay | Admitting: Family Medicine

## 2023-01-31 ENCOUNTER — Ambulatory Visit (INDEPENDENT_AMBULATORY_CARE_PROVIDER_SITE_OTHER): Payer: Medicare Other | Admitting: Family Medicine

## 2023-01-31 ENCOUNTER — Encounter: Payer: Self-pay | Admitting: Family Medicine

## 2023-01-31 VITALS — BP 128/76 | HR 59 | Temp 97.6°F | Ht 61.0 in | Wt 140.8 lb

## 2023-01-31 DIAGNOSIS — F419 Anxiety disorder, unspecified: Secondary | ICD-10-CM | POA: Diagnosis not present

## 2023-01-31 DIAGNOSIS — M810 Age-related osteoporosis without current pathological fracture: Secondary | ICD-10-CM | POA: Diagnosis not present

## 2023-01-31 DIAGNOSIS — I10 Essential (primary) hypertension: Secondary | ICD-10-CM

## 2023-01-31 DIAGNOSIS — E7800 Pure hypercholesterolemia, unspecified: Secondary | ICD-10-CM | POA: Diagnosis not present

## 2023-01-31 DIAGNOSIS — R143 Flatulence: Secondary | ICD-10-CM | POA: Diagnosis not present

## 2023-01-31 DIAGNOSIS — R1310 Dysphagia, unspecified: Secondary | ICD-10-CM | POA: Diagnosis not present

## 2023-01-31 DIAGNOSIS — K5900 Constipation, unspecified: Secondary | ICD-10-CM | POA: Insufficient documentation

## 2023-01-31 DIAGNOSIS — R7309 Other abnormal glucose: Secondary | ICD-10-CM

## 2023-01-31 LAB — COMPREHENSIVE METABOLIC PANEL
ALT: 14 U/L (ref 0–35)
AST: 24 U/L (ref 0–37)
Albumin: 4.2 g/dL (ref 3.5–5.2)
Alkaline Phosphatase: 70 U/L (ref 39–117)
BUN: 14 mg/dL (ref 6–23)
CO2: 32 mEq/L (ref 19–32)
Calcium: 9.6 mg/dL (ref 8.4–10.5)
Chloride: 99 mEq/L (ref 96–112)
Creatinine, Ser: 0.82 mg/dL (ref 0.40–1.20)
GFR: 65.75 mL/min (ref >60.00)
Glucose, Bld: 64 mg/dL — ABNORMAL LOW (ref 70–99)
Potassium: 4.7 mEq/L (ref 3.5–5.1)
Sodium: 139 mEq/L (ref 135–145)
Total Bilirubin: 0.5 mg/dL (ref 0.2–1.2)
Total Protein: 7.2 g/dL (ref 6.0–8.3)

## 2023-01-31 LAB — LIPID PANEL
Cholesterol: 213 mg/dL — ABNORMAL HIGH (ref 0–200)
HDL: 63.3 mg/dL (ref >39.00)
LDL Cholesterol: 124 mg/dL — ABNORMAL HIGH (ref 0–99)
NonHDL: 149.92
Total CHOL/HDL Ratio: 3
Triglycerides: 131 mg/dL (ref 0.0–149.0)
VLDL: 26.2 mg/dL (ref 0.0–40.0)

## 2023-01-31 LAB — HEMOGLOBIN A1C: Hgb A1c MFr Bld: 5.8 % (ref 4.6–6.5)

## 2023-01-31 MED ORDER — HYDROCHLOROTHIAZIDE 12.5 MG PO CAPS: 12.5000 mg | ORAL_CAPSULE | Freq: Every day | ORAL | 1 refills | Status: DC

## 2023-01-31 NOTE — Patient Instructions (Addendum)
Nice to see you. Please look at the FODMAP diet below and try to cut out some of the foods that you eat most of. Please try the MiraLAX daily to see if that helps with constipation.  If you do not notice any benefit in the next week or 2 with this please let me know. Somebody should contact you about the Prolia.  If you do not hear from [place] within 2 weeks please let us know.  Low-FODMAP Eating Plan  FODMAP stands for fermentable oligosaccharides, disaccharides, monosaccharides, and polyols. These are sugars that are hard for some people to digest. A low-FODMAP eating plan may help some people who have irritable bowel syndrome (IBS) and certain other bowel (intestinal) diseases to manage their symptoms. This meal plan can be complicated to follow. Work with a diet and nutrition specialist (dietitian) to make a low-FODMAP eating plan that is right for you. A dietitian can help make sure that you get enough nutrition from this diet. What are tips for following this plan? Reading food labels Check labels for hidden FODMAPs such as: High-fructose syrup. Honey. Agave. Natural fruit flavors. Onion or garlic powder. Choose low-FODMAP foods that contain 3-4 grams of fiber per serving. Check food labels for serving sizes. Eat only one serving at a time to make sure FODMAP levels stay low. Shopping Shop with a list of foods that are recommended on this diet and make a meal plan. Meal planning Follow a low-FODMAP eating plan for up to 6 weeks, or as told by your health care provider or dietitian. To follow the eating plan: Eliminate high-FODMAP foods from your diet completely. Choose only low-FODMAP foods to eat. You will do this for 2-6 weeks. Gradually reintroduce high-FODMAP foods into your diet one at a time. Most people should wait a few days before introducing the next new high-FODMAP food into their meal plan. Your dietitian can recommend how quickly you may reintroduce foods. Keep a daily record  of what and how much you eat and drink. Make note of any symptoms that you have after eating. Review your daily record with a dietitian regularly to identify which foods you can eat and which foods you should avoid. General tips Drink enough fluid each day to keep your urine pale yellow. Avoid processed foods. These often have added sugar and may be high in FODMAPs. Avoid most dairy products, whole grains, and sweeteners. Work with a dietitian to make sure you get enough fiber in your diet. Avoid high FODMAP foods at meals to manage symptoms. Recommended foods Fruits Bananas, oranges, tangerines, lemons, limes, blueberries, raspberries, strawberries, grapes, cantaloupe, honeydew melon, kiwi, papaya, passion fruit, and pineapple. Limited amounts of dried cranberries, banana chips, and shredded coconut. Vegetables Eggplant, zucchini, cucumber, peppers, green beans, bean sprouts, lettuce, arugula, kale, Swiss chard, spinach, collard greens, bok choy, summer squash, potato, and tomato. Limited amounts of corn, carrot, and sweet potato. Green parts of scallions. Grains Gluten-free grains, such as rice, oats, buckwheat, quinoa, corn, polenta, and millet. Gluten-free pasta, bread, or cereal. Rice noodles. Corn tortillas. Meats and other proteins Unseasoned beef, pork, poultry, or fish. Eggs. Berniece Salines. Tofu (firm) and tempeh. Limited amounts of nuts and seeds, such as almonds, walnuts, [place] nuts, pecans, peanuts, nut butters, pumpkin seeds, chia seeds, and sunflower seeds. Dairy Lactose-free milk, yogurt, and kefir. Lactose-free cottage cheese and ice cream. Non-dairy milks, such as almond, coconut, hemp, and rice milk. Non-dairy yogurt. Limited amounts of goat cheese, brie, mozzarella, parmesan, swiss, and other hard cheeses.  Fats and oils Butter-free spreads. Vegetable oils, such as olive, canola, and sunflower oil. Seasoning and other foods Artificial sweeteners with names that do not end in "ol,"  such as aspartame, saccharine, and stevia. Maple syrup, white table sugar, raw sugar, brown sugar, and molasses. Mayonnaise, soy sauce, and tamari. Fresh basil, coriander, parsley, rosemary, and thyme. Beverages Water and mineral water. Sugar-sweetened soft drinks. Small amounts of orange juice or cranberry juice. Black and green tea. Most dry wines. Coffee. The items listed above may not be a complete list of foods and beverages you can eat. Contact a dietitian for more information. Foods to avoid Fruits Fresh, dried, and juiced forms of apple, pear, watermelon, peach, plum, cherries, apricots, blackberries, boysenberries, figs, nectarines, and mango. Avocado. Vegetables Chicory root, artichoke, asparagus, cabbage, snow peas, Brussels sprouts, broccoli, sugar snap peas, mushrooms, celery, and cauliflower. Onions, garlic, leeks, and the white part of scallions. Grains Wheat, including kamut, durum, and semolina. Barley and bulgur. Couscous. Wheat-based cereals. Wheat noodles, bread, crackers, and pastries. Meats and other proteins Fried or fatty meat. Sausage. Cashews and pistachios. Soybeans, baked beans, black beans, chickpeas, kidney beans, fava beans, navy beans, lentils, black-eyed peas, and split peas. Dairy Milk, yogurt, ice cream, and soft cheese. Cream and sour cream. Milk-based sauces. Custard. Buttermilk. Soy milk. Seasoning and other foods Any sugar-free gum or candy. Foods that contain artificial sweeteners such as sorbitol, mannitol, isomalt, or xylitol. Foods that contain honey, high-fructose corn syrup, or agave. Bouillon, vegetable stock, beef stock, and chicken stock. Garlic and onion powder. Condiments made with onion, such as hummus, chutney, pickles, relish, salad dressing, and salsa. Tomato paste. Beverages Chicory-based drinks. Coffee substitutes. Chamomile tea. Fennel tea. Sweet or fortified wines such as port or sherry. Diet soft drinks made with isomalt, mannitol,  maltitol, sorbitol, or xylitol. Apple, pear, and mango juice. Juices with high-fructose corn syrup. The items listed above may not be a complete list of foods and beverages you should avoid. Contact a dietitian for more information. Summary FODMAP stands for fermentable oligosaccharides, disaccharides, monosaccharides, and polyols. These are sugars that are hard for some people to digest. A low-FODMAP eating plan is a short-term diet that helps to ease symptoms of certain bowel diseases. The eating plan usually lasts up to 6 weeks. After that, high-FODMAP foods are reintroduced gradually and one at a time. This can help you find out which foods may be causing symptoms. A low-FODMAP eating plan can be complicated. It is best to work with a dietitian who has experience with this type of plan. This information is not intended to replace advice given to you by your health care provider. Make sure you discuss any questions you have with your health care provider. Document Revised: 03/13/2020 Document Reviewed: 03/13/2020 Elsevier Patient Education  [place].

## 2023-01-31 NOTE — Assessment & Plan Note (Signed)
Chronic issue.  Adequately controlled.  She will continue metoprolol 25 mg daily, HCTZ 12.5 mg daily, and irbesartan 75 mg daily.  Check labs.

## 2023-01-31 NOTE — Assessment & Plan Note (Signed)
Chronic issue.  She will trial MiraLAX 1 capful daily and see if that is beneficial.  If it is not helpful she will let us know.

## 2023-01-31 NOTE — Assessment & Plan Note (Signed)
Chronic issue.  Asymptomatic.  She will continue Zoloft 50 mg daily.

## 2023-01-31 NOTE — Assessment & Plan Note (Signed)
Possibly related to food intolerance.  She was given a FODMAP diet to try.  Advised to look at the foods on the FODMAP diet and eliminate the ones that she eats the most of one by one.

## 2023-01-31 NOTE — Progress Notes (Signed)
Tommi Rumps, MD Phone: [phone]  CASSIA SISSOM is a 87 y.o. female who presents today for f/u.  HYPERTENSION Disease Monitoring Home BP Monitoring typically lower than today, though does not check frequently at home Chest pain- no    Dyspnea- no Medications Compliance-  taking irbesartan, metoprolol, HCTZ.   Edema- no BMET    Component Value Date/Time   NA 140 05/17/2022 1436   K 4.0 05/17/2022 1436   CL 99 05/17/2022 1436   CO2 31 05/17/2022 1436   GLUCOSE 108 (H) 05/17/2022 1436   BUN 24 (H) 05/17/2022 1436   CREATININE 0.93 05/17/2022 1436   CREATININE 0.94 03/19/2022 1526   CALCIUM 9.3 05/17/2022 1436   GFRNONAA >60 04/25/2022 0945   GFRAA >60 10/04/2019 1412   Anxiety: Patient notes no anxiety or depression.  She is on Zoloft.  No SI.  Osteoporosis: Patient notes she switched insurance recently.  She wants to see what the Prolia will cost her now.  She does have a history of swallowing issues in the past.  No fractures.  She does take calcium.  She is not on vitamin D given that hers was elevated previously.  Flatulence/constipation: Patient notes this has been going on a year or so.  She passes a lot of gas.  She occasionally gets constipated and passes small balls of stool with some straining.  She takes prunes and drinks coffee to help with this.  No blood in her stool, abdominal pain, or bloated sensation.  Social History   Tobacco Use  Smoking Status Former   Packs/day: 1.00   Years: 4.00   Additional pack years: 0.00   Total pack years: 4.00   Types: Cigarettes   Quit date: 1974   Years since quitting: 50.2  Smokeless Tobacco Never    Current Outpatient Medications on File Prior to Visit  Medication Sig Dispense Refill   B Complex-C (SUPER B COMPLEX PO) Take 1 tablet by mouth daily.     CALCIUM-MAGNESIUM-ZINC PO Take 1 tablet by mouth daily.     diclofenac (VOLTAREN) 75 MG EC tablet TAKE 1 TABLET BY MOUTH TWICE DAILY AS NEEDED FOR MODERATE PAIN  60 tablet 0   gabapentin (NEURONTIN) 300 MG capsule TAKE 1 CAPSULE BY MOUTH THREE TIMES DAILY 90 capsule 5   irbesartan (AVAPRO) 75 MG tablet Take 1 tablet (75 mg total) by mouth daily. 30 tablet 5   methimazole (TAPAZOLE) 5 MG tablet Take 2.5 mg by mouth at bedtime.     metoprolol succinate (TOPROL XL) 25 MG 24 hr tablet Take 1 tablet (25 mg total) by mouth daily. 30 tablet 5   Multiple Vitamin (MULTIVITAMIN) capsule Take 1 capsule by mouth daily.     Omeprazole Magnesium (PRILOSEC PO)      sertraline (ZOLOFT) 50 MG tablet TAKE 1 TABLET BY MOUTH DAILY 90 tablet 1   No current facility-administered medications on file prior to visit.     ROS see history of present illness  Objective  Physical Exam Vitals:   01/31/23 0918 01/31/23 0947  BP: 136/78 128/76  Pulse: (!) 59   Temp: 97.6 F (36.4 C)   SpO2: 97%     BP Readings from Last 3 Encounters:  01/31/23 128/76  09/27/22 (!) 130/59  09/21/22 (!) 138/58   Wt Readings from Last 3 Encounters:  01/31/23 140 lb 12.8 oz (63.9 kg)  09/27/22 137 lb (62.1 kg)  09/21/22 144 lb (65.3 kg)    Physical Exam Constitutional:  General: She is not in acute distress.    Appearance: She is not diaphoretic.  Cardiovascular:     Rate and Rhythm: Normal rate and regular rhythm.     Heart sounds: Normal heart sounds.  Pulmonary:     Effort: Pulmonary effort is normal.     Breath sounds: Normal breath sounds.  Abdominal:     General: Bowel sounds are normal. There is no distension.     Palpations: Abdomen is soft. There is no mass.     Tenderness: There is no guarding.     Comments: Slight discomfort in the left lower quadrant on palpation  Skin:    General: Skin is warm and dry.  Neurological:     Mental Status: She is alert.      Assessment/Plan: Please see individual problem list.  Primary hypertension Assessment & Plan: Chronic issue.  Adequately controlled.  She will continue metoprolol 25 mg daily, HCTZ 12.5 mg daily,  and irbesartan 75 mg daily.  Check labs.  Orders: -     hydroCHLOROthiazide; Take 1 capsule (12.5 mg total) by mouth daily.  Dispense: 90 capsule; Refill: 1 -     Comprehensive metabolic panel  Dysphagia, unspecified type  Age-related osteoporosis without current pathological fracture Assessment & Plan: Chronic issue.  She has been off of Prolia.  Will see if her new insurance covers this for her to get restarted.  Fosamax would not be an option given her history of swallowing issues.  Discussed zoledronic acid as a potential option if Prolia is not covered.  Orders: -     VITAMIN D 25 Hydroxy (Vit-D Deficiency, Fractures)  Anxiety Assessment & Plan: Chronic issue.  Asymptomatic.  She will continue Zoloft 50 mg daily.   Flatulence Assessment & Plan: Possibly related to food intolerance.  She was given a FODMAP diet to try.  Advised to look at the foods on the FODMAP diet and eliminate the ones that she eats the most of one by one.   Constipation, unspecified constipation type Assessment & Plan: Chronic issue.  She will trial MiraLAX 1 capful daily and see if that is beneficial.  If it is not helpful she will let us know.   Elevated glucose -     Hemoglobin A1c  Elevated LDL cholesterol level -     Comprehensive metabolic panel -     Lipid panel    Return in about 6 months (around 08/03/2023).   Tommi Rumps, MD [place]

## 2023-01-31 NOTE — Assessment & Plan Note (Signed)
Chronic issue.  She has been off of Prolia.  Will see if her new insurance covers this for her to get restarted.  Fosamax would not be an option given her history of swallowing issues.  Discussed zoledronic acid as a potential option if Prolia is not covered.

## 2023-02-01 ENCOUNTER — Other Ambulatory Visit: Payer: Self-pay | Admitting: Family Medicine

## 2023-02-01 DIAGNOSIS — G629 Polyneuropathy, unspecified: Secondary | ICD-10-CM

## 2023-02-01 LAB — VITAMIN D 25 HYDROXY (VIT D DEFICIENCY, FRACTURES): VITD: 34.33 ng/mL (ref 30.00–100.00)

## 2023-02-09 ENCOUNTER — Telehealth: Payer: Self-pay | Admitting: Family Medicine

## 2023-02-09 DIAGNOSIS — M810 Age-related osteoporosis without current pathological fracture: Secondary | ICD-10-CM

## 2023-02-09 NOTE — Telephone Encounter (Signed)
Will run insurance through benefit verification & will go from there.

## 2023-02-09 NOTE — Telephone Encounter (Signed)
This patient was on prolia, though has new insurance and wanted to see if she could get back on it. How do we start that process? Thanks.

## 2023-02-15 NOTE — Telephone Encounter (Signed)
Pt responsible for $301 for Prolia; PA required.  Will send to PA team

## 2023-02-18 NOTE — Telephone Encounter (Signed)
Second PA request for Prolia

## 2023-02-21 ENCOUNTER — Other Ambulatory Visit: Payer: Self-pay | Admitting: Family Medicine

## 2023-02-21 NOTE — Telephone Encounter (Signed)
Patient states she has never been on Reclast. Patient states as long as you think it is ok she is willing to try it.

## 2023-02-21 NOTE — Telephone Encounter (Signed)
Noted.  Please let the patient know that her insurance would prefer that she be on Reclast.  Reclast is a once yearly infusion.  Has she tried Reclast before?  If she has not taken this previously we could refer her to endocrinology to discuss this as I do not manage Reclast.

## 2023-02-21 NOTE — Telephone Encounter (Signed)
Submitted a prior authorization via [employer] portal. Key # [id number]  Received a call from Mardella Layman at [employer] about the prior [place] for Prolia. She stated Step Therapy is required and to see if Reclast could be administered instead. Reclast is on the formulary.   Her call back is [phone] about changing the medication or going with the prior authorization and it is ok to leave her a voice mail.

## 2023-02-22 NOTE — Addendum Note (Signed)
Addended by: Glori Luis on: 02/22/2023 09:34 AM   Modules accepted: Orders

## 2023-02-22 NOTE — Telephone Encounter (Signed)
I have placed the referral to endocrinology to discuss treatment options. They would be the ones that would manage reclast.

## 2023-02-22 NOTE — Telephone Encounter (Signed)
Let Patient know the referral was placed and someone will contact her

## 2023-02-23 ENCOUNTER — Other Ambulatory Visit: Payer: Self-pay | Admitting: Family Medicine

## 2023-02-23 ENCOUNTER — Other Ambulatory Visit (HOSPITAL_COMMUNITY): Payer: Self-pay

## 2023-02-23 DIAGNOSIS — M48061 Spinal stenosis, lumbar region without neurogenic claudication: Secondary | ICD-10-CM

## 2023-02-27 ENCOUNTER — Other Ambulatory Visit: Payer: Self-pay | Admitting: *Deleted

## 2023-03-11 DIAGNOSIS — D485 Neoplasm of uncertain behavior of skin: Secondary | ICD-10-CM | POA: Diagnosis not present

## 2023-03-11 DIAGNOSIS — D2261 Melanocytic nevi of right upper limb, including shoulder: Secondary | ICD-10-CM | POA: Diagnosis not present

## 2023-03-11 DIAGNOSIS — D2272 Melanocytic nevi of left lower limb, including hip: Secondary | ICD-10-CM | POA: Diagnosis not present

## 2023-03-11 DIAGNOSIS — D225 Melanocytic nevi of trunk: Secondary | ICD-10-CM | POA: Diagnosis not present

## 2023-03-11 DIAGNOSIS — D0461 Carcinoma in situ of skin of right upper limb, including shoulder: Secondary | ICD-10-CM | POA: Diagnosis not present

## 2023-03-11 DIAGNOSIS — L570 Actinic keratosis: Secondary | ICD-10-CM | POA: Diagnosis not present

## 2023-03-11 DIAGNOSIS — Z85828 Personal history of other malignant neoplasm of skin: Secondary | ICD-10-CM | POA: Diagnosis not present

## 2023-03-24 ENCOUNTER — Other Ambulatory Visit: Payer: Self-pay

## 2023-03-24 MED ORDER — IRBESARTAN 75 MG PO TABS: 75.0000 mg | ORAL_TABLET | Freq: Every day | ORAL | 5 refills | Status: DC

## 2023-03-24 MED ORDER — METOPROLOL SUCCINATE ER 25 MG PO TB24: 25.0000 mg | ORAL_TABLET | Freq: Every day | ORAL | 5 refills | Status: DC

## 2023-03-25 ENCOUNTER — Telehealth: Payer: Self-pay | Admitting: Family Medicine

## 2023-03-25 NOTE — Telephone Encounter (Signed)
Pt called in to let Dr. Birdie Sons knows that she's going to be changing her pharmacy to [facility]. However, she still wants to keep [facility] for not long term meds. As per pt, [facility], will be sending fax to provider asking to sent her meds over to them.

## 2023-03-29 ENCOUNTER — Other Ambulatory Visit: Payer: Self-pay | Admitting: *Deleted

## 2023-03-29 DIAGNOSIS — M48061 Spinal stenosis, lumbar region without neurogenic claudication: Secondary | ICD-10-CM

## 2023-03-29 DIAGNOSIS — I10 Essential (primary) hypertension: Secondary | ICD-10-CM

## 2023-03-29 DIAGNOSIS — G629 Polyneuropathy, unspecified: Secondary | ICD-10-CM

## 2023-03-29 NOTE — Telephone Encounter (Signed)
Noted  

## 2023-03-29 NOTE — Telephone Encounter (Signed)
Pt requesting refills on 4 medications that she would like to switch to [facility].  Pended for your approval

## 2023-03-30 ENCOUNTER — Telehealth: Payer: Self-pay

## 2023-03-30 DIAGNOSIS — K5909 Other constipation: Secondary | ICD-10-CM

## 2023-03-30 NOTE — Telephone Encounter (Signed)
Please call the patient and see how often she is taking the diclofenac.  This was refilled about a month ago and I wonder if she actually needs a refill at this time.

## 2023-03-30 NOTE — Telephone Encounter (Signed)
Patient states she is still having this "horrible cycle of stomach problems." Patient is complaining of constipation and severe gas. Patient would like a referral like you had talked to her about.

## 2023-03-31 ENCOUNTER — Other Ambulatory Visit: Payer: Self-pay

## 2023-03-31 ENCOUNTER — Other Ambulatory Visit: Payer: Self-pay | Admitting: Family Medicine

## 2023-03-31 ENCOUNTER — Other Ambulatory Visit (HOSPITAL_COMMUNITY): Payer: Self-pay

## 2023-03-31 DIAGNOSIS — G629 Polyneuropathy, unspecified: Secondary | ICD-10-CM

## 2023-03-31 DIAGNOSIS — I10 Essential (primary) hypertension: Secondary | ICD-10-CM

## 2023-03-31 MED ORDER — LACTULOSE 10 GM/15ML PO SOLN: 20.0000 g | Freq: Every day | ORAL | 0 refills | Status: DC | PRN

## 2023-03-31 MED ORDER — IRBESARTAN 75 MG PO TABS
75.0000 mg | ORAL_TABLET | Freq: Every day | ORAL | 5 refills | Status: DC
  Filled 2023-03-31 – 2023-04-11 (×2): qty 30, 30d supply, fill #0

## 2023-03-31 MED ORDER — METOPROLOL SUCCINATE ER 25 MG PO TB24
25.0000 mg | ORAL_TABLET | Freq: Every day | ORAL | 5 refills | Status: DC
  Filled 2023-03-31 – 2023-04-11 (×2): qty 30, 30d supply, fill #0

## 2023-03-31 NOTE — Telephone Encounter (Signed)
Called Patient to let her know that the referral was placed to [facility] and Dr. Birdie Sons called in Lactulose to see if it will help move her bowels.

## 2023-03-31 NOTE — Addendum Note (Signed)
Addended by: Glori Luis on: 03/31/2023 10:33 AM   Modules accepted: Orders

## 2023-03-31 NOTE — Telephone Encounter (Signed)
Noted.  I placed a referral to [facility] for this.  I also sent in lactulose for the patient to try to see if this would help with her bowel movements.

## 2023-04-01 ENCOUNTER — Other Ambulatory Visit: Payer: Self-pay

## 2023-04-05 ENCOUNTER — Other Ambulatory Visit: Payer: Self-pay

## 2023-04-05 ENCOUNTER — Telehealth: Payer: Self-pay | Admitting: *Deleted

## 2023-04-05 DIAGNOSIS — M65331 Trigger finger, right middle finger: Secondary | ICD-10-CM | POA: Diagnosis not present

## 2023-04-05 MED ORDER — METHIMAZOLE 5 MG PO TABS: 2.5000 mg | ORAL_TABLET | Freq: Every day | ORAL | 3 refills | Status: DC

## 2023-04-05 MED FILL — Gabapentin Cap 300 MG: ORAL | 30 days supply | Qty: 90 | Fill #0 | Status: AC

## 2023-04-05 NOTE — Telephone Encounter (Signed)
Opened in error

## 2023-04-05 NOTE — Telephone Encounter (Signed)
Noted, thanks. Will route to PCP for [facility]

## 2023-04-05 NOTE — Telephone Encounter (Signed)
I called pt to check on Endocrinology referral. Pt states that she has not heard from anyone. Will route to [place] for update.

## 2023-04-06 ENCOUNTER — Other Ambulatory Visit: Payer: Self-pay

## 2023-04-08 ENCOUNTER — Other Ambulatory Visit (HOSPITAL_COMMUNITY): Payer: Self-pay

## 2023-04-09 HISTORY — PX: OTHER SURGICAL HISTORY: SHX169

## 2023-04-11 ENCOUNTER — Other Ambulatory Visit: Payer: Self-pay

## 2023-04-11 ENCOUNTER — Telehealth: Payer: Self-pay | Admitting: Family Medicine

## 2023-04-11 NOTE — Telephone Encounter (Signed)
Copied from CRM [id number]. Topic: Medicare AWV >> Apr 11, 2023  3:18 PM Payton Doughty wrote: Reason for CRM: LM 04/11/2023 to schedule AWV   Verlee Rossetti; Care Guide Ambulatory Clinical Support [place] [facility] Direct Dial: [phone]

## 2023-04-12 ENCOUNTER — Other Ambulatory Visit: Payer: Self-pay | Admitting: Family Medicine

## 2023-04-12 DIAGNOSIS — I10 Essential (primary) hypertension: Secondary | ICD-10-CM

## 2023-04-12 DIAGNOSIS — M48061 Spinal stenosis, lumbar region without neurogenic claudication: Secondary | ICD-10-CM

## 2023-04-12 DIAGNOSIS — G629 Polyneuropathy, unspecified: Secondary | ICD-10-CM

## 2023-04-12 MED ORDER — DICLOFENAC SODIUM 75 MG PO TBEC: DELAYED_RELEASE_TABLET | ORAL | 0 refills | Status: DC

## 2023-04-12 MED ORDER — SERTRALINE HCL 50 MG PO TABS
50.0000 mg | ORAL_TABLET | Freq: Every day | ORAL | 1 refills | Status: DC
  Filled 2023-05-31: qty 90, 90d supply, fill #0
  Filled 2023-09-11: qty 90, 90d supply, fill #1

## 2023-04-12 MED ORDER — GABAPENTIN 300 MG PO CAPS
300.0000 mg | ORAL_CAPSULE | Freq: Three times a day (TID) | ORAL | 5 refills | Status: DC
Start: 2023-04-12 — End: 2023-11-17
  Filled 2023-08-09: qty 90, 30d supply, fill #0
  Filled 2023-09-11: qty 90, 30d supply, fill #1
  Filled 2023-10-24: qty 60, 20d supply, fill #2

## 2023-04-12 MED ORDER — HYDROCHLOROTHIAZIDE 12.5 MG PO CAPS
12.5000 mg | ORAL_CAPSULE | Freq: Every day | ORAL | 1 refills | Status: DC
Start: 2023-04-12 — End: 2023-10-24
  Filled 2023-08-09: qty 80, 80d supply, fill #0

## 2023-04-13 ENCOUNTER — Other Ambulatory Visit: Payer: Self-pay

## 2023-04-13 MED ORDER — AMOXICILLIN 500 MG PO CAPS
2000.0000 mg | ORAL_CAPSULE | ORAL | 2 refills | Status: DC
  Filled 2023-04-13: qty 8, 2d supply, fill #0
  Filled 2023-05-31: qty 8, 2d supply, fill #1

## 2023-04-24 ENCOUNTER — Other Ambulatory Visit: Payer: Self-pay | Admitting: Family Medicine

## 2023-04-24 DIAGNOSIS — M48061 Spinal stenosis, lumbar region without neurogenic claudication: Secondary | ICD-10-CM

## 2023-04-28 DIAGNOSIS — D0461 Carcinoma in situ of skin of right upper limb, including shoulder: Secondary | ICD-10-CM | POA: Diagnosis not present

## 2023-05-13 ENCOUNTER — Other Ambulatory Visit: Payer: Self-pay

## 2023-05-13 ENCOUNTER — Ambulatory Visit (INDEPENDENT_AMBULATORY_CARE_PROVIDER_SITE_OTHER): Payer: Medicare Other | Admitting: *Deleted

## 2023-05-13 VITALS — Ht 61.0 in | Wt 138.0 lb

## 2023-05-13 DIAGNOSIS — Z0000 Encounter for general adult medical examination without abnormal findings: Secondary | ICD-10-CM | POA: Diagnosis not present

## 2023-05-13 MED ORDER — METHIMAZOLE 5 MG PO TABS: 2.5000 mg | ORAL_TABLET | Freq: Every day | ORAL | 1 refills | Status: DC

## 2023-05-13 MED ORDER — METOPROLOL SUCCINATE ER 25 MG PO TB24
25.0000 mg | ORAL_TABLET | Freq: Every day | ORAL | 5 refills | Status: DC
  Filled 2023-05-31: qty 30, 30d supply, fill #0
  Filled 2023-07-02: qty 30, 30d supply, fill #1
  Filled 2023-08-09: qty 30, 30d supply, fill #2
  Filled 2023-09-28: qty 30, 30d supply, fill #3
  Filled 2023-10-24: qty 30, 30d supply, fill #4
  Filled 2023-12-12: qty 30, 30d supply, fill #5

## 2023-05-13 MED ORDER — IRBESARTAN 75 MG PO TABS
75.0000 mg | ORAL_TABLET | Freq: Every day | ORAL | 5 refills | Status: DC
  Filled 2023-05-31: qty 30, 30d supply, fill #0
  Filled 2023-07-02: qty 30, 30d supply, fill #1
  Filled 2023-08-09: qty 30, 30d supply, fill #2
  Filled 2023-09-11: qty 30, 30d supply, fill #3
  Filled 2023-10-13: qty 30, 30d supply, fill #4
  Filled 2023-11-17: qty 30, 30d supply, fill #5

## 2023-05-13 NOTE — Progress Notes (Signed)
Subjective:   Morgan Byrd is a 87 y.o. female who presents for Medicare Annual (Subsequent) preventive examination.  Visit Complete: Virtual  I connected with  Morgan Byrd on 05/13/23 by a audio enabled telemedicine application and verified that I am speaking with the correct person using two identifiers.  Patient Location: Home  Provider Location: Office/Clinic  I discussed the limitations of evaluation and management by telemedicine. The patient expressed understanding and agreed to proceed.   Review of Systems     Cardiac Risk Factors include: advanced age (>30men, >42 women);hypertension     Objective:    Today's Vitals   05/13/23 1108  Weight: 138 lb (62.6 kg)  Height: 5\' 1"  (1.549 m)   Body mass index is 26.07 kg/m.     05/13/2023   11:20 AM 09/27/2022   10:18 AM 04/25/2022    9:45 AM 04/12/2022    9:55 AM 07/20/2021    9:01 AM 07/08/2021    9:14 AM 04/09/2021   12:54 PM  Advanced Directives  Does Patient Have a Medical Advance Directive? Yes Yes Yes Yes Yes Yes Yes  Type of Estate agent of [place];Living will  Healthcare Power of [place];Living will Healthcare Power of [place];Living will Healthcare Power of [place];Living will  Healthcare Power of [place];Living will  Does patient want to make changes to medical advance directive?   No - Patient declined No - Patient declined No - Patient declined  No - Patient declined  Copy of Healthcare Power of Attorney in Chart? No - copy requested   No - copy requested No - copy requested  No - copy requested    Current Medications (verified) Outpatient Encounter Medications as of 05/13/2023  Medication Sig   B Complex-C (SUPER B COMPLEX PO) Take 1 tablet by mouth daily.   CALCIUM-MAGNESIUM-ZINC PO Take 1 tablet by mouth daily.   diclofenac (VOLTAREN) 75 MG EC tablet TAKE 1 TABLET BY MOUTH TWICE DAILY AS NEEDED FOR MODERATE PAIN   gabapentin (NEURONTIN) 300 MG capsule Take 1 capsule (300 mg  total) by mouth 3 (three) times daily.   hydrochlorothiazide (MICROZIDE) 12.5 MG capsule Take 1 capsule (12.5 mg total) by mouth daily.   irbesartan (AVAPRO) 75 MG tablet Take 1 tablet (75 mg total) by mouth daily.   lactulose (CHRONULAC) 10 GM/15ML solution Take 30 mLs (20 g total) by mouth daily as needed for mild constipation or moderate constipation.   methimazole (TAPAZOLE) 5 MG tablet Take 0.5 tablets (2.5 mg total) by mouth daily.   metoprolol succinate (TOPROL XL) 25 MG 24 hr tablet Take 1 tablet (25 mg total) by mouth daily.   Multiple Vitamin (MULTIVITAMIN) capsule Take 1 capsule by mouth daily.   omeprazole (PRILOSEC) 20 MG capsule Take 1 capsule (20 mg total) by mouth daily.   sertraline (ZOLOFT) 50 MG tablet Take 1 tablet (50 mg total) by mouth daily.   amoxicillin (AMOXIL) 500 MG capsule Take 4 capsules (2,000 mg total) by mouth 1 hour before dental appointment. (Patient not taking: Reported on 05/13/2023)   [DISCONTINUED] methimazole (TAPAZOLE) 5 MG tablet Take 2.5 mg by mouth at bedtime. (Patient not taking: Reported on 05/13/2023)   [DISCONTINUED] Omeprazole Magnesium (PRILOSEC PO)  (Patient not taking: Reported on 05/13/2023)   No facility-administered encounter medications on file as of 05/13/2023.    Allergies (verified) Codeine, Fosamax [alendronate sodium], Lexapro [escitalopram oxalate], and Spiriva respimat [tiotropium bromide monohydrate]   History: Past Medical History:  Diagnosis Date   Anxiety  Arthritis    C2 cervical fracture (HCC) 11/14/2019   Cancer (HCC)    Complication of anesthesia    Heart murmur    History of closed fracture of nasal bones 10/11/2019   Hypertension    Lung nodule 04/05/2019   CT scan completed May 2020 recommends repeating in 12 months   Nasal fracture 11/14/2019   PONV (postoperative nausea and vomiting)    Past Surgical History:  Procedure Laterality Date   ABDOMINAL HYSTERECTOMY     BLADDER SUSPENSION     BREAST BIOPSY Right  1978   neg   cancer cell removed from hand  04/2023   COLONOSCOPY WITH PROPOFOL N/A 12/26/2015   Procedure: COLONOSCOPY WITH PROPOFOL;  Surgeon: Scot Jun, MD;  Location: [facility] ENDOSCOPY;  Service: Endoscopy;  Laterality: N/A;   ESOPHAGOGASTRODUODENOSCOPY N/A 09/27/2022   Procedure: ESOPHAGOGASTRODUODENOSCOPY (EGD);  Surgeon: Jaynie Collins, DO;  Location[facility] ENDOSCOPY;  Service: Gastroenterology;  Laterality: N/A;   LUMBAR LAMINECTOMY/DECOMPRESSION MICRODISCECTOMY Left 07/20/2021   Procedure: OPEN L4/5 LEFT HEMILAMINECTOMY & DISCECTOMY;  Surgeon: Lucy Chris, MD;  Location: [facility] ORS;  Service: Neurosurgery;  Laterality: Left;   NOSE SURGERY     REPLACEMENT TOTAL KNEE BILATERAL     SHOULDER SURGERY     VAGINAL DELIVERY     2   Family History  Problem Relation Age of Onset   Dementia Mother    Arthritis Mother    Heart disease Father    Hypertension Father    Cancer Father        prostate   Diabetes Father    Diabetes Sister    Cancer Sister        breast   Breast cancer Sister 7   Heart disease Sister    Vasculitis Son    Kidney disease Son    Pneumonia Son    Breast cancer Cousin 58   Social History   Socioeconomic History   Marital status: Widowed    Spouse name: Not on file   Number of children: Not on file   Years of education: Not on file   Highest education level: Not on file  Occupational History   Not on file  Tobacco Use   Smoking status: Former    Packs/day: 1.00    Years: 4.00    Additional pack years: 0.00    Total pack years: 4.00    Types: Cigarettes    Quit date: 62    Years since quitting: 50.5   Smokeless tobacco: Never  Vaping Use   Vaping Use: Never used  Substance and Sexual Activity   Alcohol use: No   Drug use: No   Sexual activity: Never  Other Topics Concern   Not on file  Social History Narrative   Lives in [place]. Son and daughter live nearby.Work - retired Psychiatric nurse - healthy, Art therapist - housework    Social Determinants of Corporate investment banker Strain: Low Risk  (05/13/2023)   Overall Financial Resource Strain (CARDIA)    Difficulty of Paying Living Expenses: Not hard at all  Food Insecurity: No Food Insecurity (05/13/2023)   Hunger Vital Sign    Worried About Running Out of Food in the Last Year: Never true    Ran Out of Food in the Last Year: Never true  Transportation Needs: No Transportation Needs (05/13/2023)   PRAPARE - Administrator, Civil Service (Medical): No    Lack of Transportation (Non-Medical): No  Physical Activity: Inactive (05/13/2023)  Exercise Vital Sign    Days of Exercise per Week: 0 days    Minutes of Exercise per Session: 0 min  Stress: No Stress Concern Present (05/13/2023)   [employer] of Occupational Health - Occupational Stress Questionnaire    Feeling of Stress : Not at all  Social Connections: Moderately Integrated (05/13/2023)   Social Connection and Isolation Panel [NHANES]    Frequency of Communication with Friends and Family: More than three times a week    Frequency of Social Gatherings with Friends and Family: More than three times a week    Attends Religious Services: More than 4 times per year    Active Member of [employer] or Organizations: Yes    Attends Banker Meetings: More than 4 times per year    Marital Status: Widowed    Tobacco Counseling Counseling given: Not Answered   Clinical Intake:  Pre-visit preparation completed: Yes  Pain : No/denies pain     BMI - recorded: 26.07 Nutritional Risks: None Diabetes: No  How often do you need to have someone help you when you read instructions, pamphlets, or other written materials from your doctor or pharmacy?: 1 - Never  Interpreter Needed?: No  Comments: R. Javona Bergevin LPN   Activities of Daily Living    05/13/2023   11:10 AM 05/12/2023    5:14 PM  In your present state of health, do you have any difficulty performing the following activities:   Hearing? 0 0  Comment no issues   Vision? 0 0  Comment readers   Difficulty concentrating or making decisions? 1 0  Comment at times   Walking or climbing stairs? 0 0  Dressing or bathing? 0 0  Doing errands, shopping? 0 0  Preparing Food and eating ? N N  Using the Toilet? N N  In the past six months, have you accidently leaked urine? N Y  Do you have problems with loss of bowel control? N N  Managing your Medications? N N  Managing your Finances? N N  Housekeeping or managing your Housekeeping? N N    Patient Care Team: Glori Luis, MD as PCP - General (Family Medicine) Debbe Odea, MD as PCP - Cardiology (Cardiology)  Indicate any recent Medical Services you may have received from other than [facility] providers in the past year (date may be approximate).     Assessment:   This is a routine wellness examination for Loa.  Hearing/Vision screen No results found.  Dietary issues and exercise activities discussed:     Goals Addressed             This Visit's Progress    Patient Stated       Going to try and exercise and walk more       Depression Screen    05/13/2023   11:16 AM 01/31/2023    9:20 AM 08/02/2022   10:00 AM 04/12/2022    9:54 AM 03/19/2022    3:04 PM 01/27/2022    9:51 AM 04/09/2021   12:50 PM  PHQ 2/9 Scores  PHQ - 2 Score 0 0 0 0 0 0 0  PHQ- 9 Score 0 0         Fall Risk    05/13/2023   11:23 AM 05/12/2023    5:14 PM 01/31/2023    9:20 AM 08/02/2022   10:00 AM 04/12/2022    9:56 AM  Fall Risk   Falls in the past year? 1 0 0 1  0  Number falls in past yr: 0 0 0 0 0  Injury with Fall? 0 0 0 0   Risk for fall due to : No Fall Risks;History of fall(s)  No Fall Risks History of fall(s)   Follow up Falls prevention discussed;Falls evaluation completed;Education provided  Falls evaluation completed Falls evaluation completed Falls evaluation completed    MEDICARE RISK AT HOME:  Medicare Risk at Home - 05/13/23 1123     Any stairs in or  around the home? Yes    If so, are there any without handrails? Yes    Home free of loose throw rugs in walkways, pet beds, electrical cords, etc? Yes    Adequate lighting in your home to reduce risk of falls? Yes    Life alert? Yes    Use of a cane, walker or w/c? No    Grab bars in the bathroom? Yes    Shower chair or bench in shower? Yes    Elevated toilet seat or a handicapped toilet? Yes              Cognitive Function:    02/14/2018    8:47 AM 02/10/2017    8:49 AM  MMSE - Mini Mental State Exam  Orientation to time 5 5  Orientation to Place 5 5  Registration 3 3  Attention/ Calculation 5 5  Recall 2 3  Language- name 2 objects 2 2  Language- repeat 1 1  Language- follow 3 step command 3 3  Language- read & follow direction 1 1  Write a sentence 1 1  Copy design 1 1  Total score 29 30        05/13/2023   11:21 AM 04/08/2020    1:52 PM 04/06/2019   11:44 AM  6CIT Screen  What Year? 0 points 0 points 0 points  What month? 0 points 0 points 0 points  What time? 0 points 0 points 0 points  Count back from 20 0 points 0 points 0 points  Months in reverse 0 points 0 points 0 points  Repeat phrase 2 points 0 points 0 points  Total Score 2 points 0 points 0 points    Immunizations Immunization History  Administered Date(s) Administered   Fluad Quad(high Dose 65+) 07/25/2019, 08/20/2021   Influenza Nasal 08/04/2020   Influenza, High Dose Seasonal PF 08/05/2016, 08/08/2017, 08/14/2018, 07/30/2022   Influenza,inj,Quad PF,6+ Mos 08/24/2014, 08/05/2015   Influenza-Unspecified 08/08/2012, 08/11/2013, 08/24/2014, 08/05/2015   PFIZER(Purple Top)SARS-COV-2 Vaccination 11/14/2019, 12/05/2019, 08/07/2020   Pfizer Covid-19 Vaccine Bivalent Booster 51yrs & up 08/20/2021   Pneumococcal Conjugate-13 05/03/2014   Pneumococcal Polysaccharide-23 04/25/2005   Respiratory Syncytial Virus Vaccine,Recomb Aduvanted(Arexvy) 07/13/2022   Tdap 04/25/2012, 10/04/2019   Zoster  Recombinant(Shingrix) 08/08/2017, 02/06/2018   Zoster, Live 06/26/2011    TDAP status: Up to date  Flu Vaccine status: Up to date  Pneumococcal vaccine status: Up to date  Covid-19 vaccine status: Completed vaccines  Qualifies for Shingles Vaccine? Yes   Zostavax completed Yes   Shingrix Completed?: Yes  Screening Tests Health Maintenance  Topic Date Due   COVID-19 Vaccine (5 - 2023-24 season) 07/09/2022   INFLUENZA VACCINE  06/09/2023   Medicare Annual Wellness (AWV)  05/12/2024   DTaP/Tdap/Td (3 - Td or Tdap) 10/03/2029   Pneumonia Vaccine 5+ Years old  Completed   DEXA SCAN  Completed   Zoster Vaccines- Shingrix  Completed   HPV VACCINES  Aged Out    Health Maintenance  Health Maintenance Due  Topic Date Due   COVID-19 Vaccine (5 - 2023-24 season) 07/09/2022    Colonoscopy in the past has aged out does not plan on having anymore per patient   Mammogram has aged out  Bone Density status: Completed 2. Results reflect: Bone density results: OSTEOPOROSIS. Repeat every 2 years. Wants to discuss with PCP to decide if she wants to have any more  Lung Cancer Screening: (Low Dose CT Chest recommended if Age 64-80 years, 20 pack-year currently smoking OR have quit w/in 15years.) does not qualify.     Additional Screening:  Hepatitis C Screening: does not qualify;  Vision Screening: Recommended annual ophthalmology exams for early detection of glaucoma and other disorders of the eye. Is the patient up to date with their annual eye exam?  Yes has appointment scheduled the end of July per patient Who is the provider or what is the name of the office in which the patient attends annual eye exams? Dr. Clydene Pugh If pt is not established with a provider, would they like to be referred to a provider to establish care? No .   Dental Screening: Recommended annual dental exams for proper oral hygiene  Community Resource Referral / Chronic Care Management: CRR required this  visit?   No  CCM required this visit?  No     Plan:     I have personally reviewed and noted the following in the patient's chart:   Medical and social history Use of alcohol, tobacco or illicit drugs  Current medications and supplements including opioid prescriptions. Patient is not currently taking opioid prescriptions. Functional ability and status Nutritional status Physical activity Advanced directives List of other physicians Hospitalizations, surgeries, and ER visits in previous 12 months Vitals Screenings to include cognitive, depression, and falls Referrals and appointments  In addition, I have reviewed and discussed with patient certain preventive protocols, quality metrics, and best practice recommendations. A written personalized care plan for preventive services as well as general preventive health recommendations were provided to patient.     Sydell Axon, LPN   11/13/1094   After Visit Summary: (MyChart) Due to this being a telephonic visit, the after visit summary with patients personalized plan was offered to patient via MyChart   Nurse Notes: None

## 2023-05-13 NOTE — Patient Instructions (Signed)
Morgan Byrd , Thank you for taking time to come for your Medicare Wellness Visit. I appreciate your ongoing commitment to your health goals. Please review the following plan we discussed and let me know if I can assist you in the future.   These are the goals we discussed:  Goals       Follow up with Provider (pt-stated)      As needed      Patient Stated      Going to try and exercise and walk more        This is a list of the screening recommended for you and due dates:  Health Maintenance  Topic Date Due   COVID-19 Vaccine (5 - 2023-24 season) 07/09/2022   Flu Shot  06/09/2023   Medicare Annual Wellness Visit  05/12/2024   DTaP/Tdap/Td vaccine (3 - Td or Tdap) 10/03/2029   Pneumonia Vaccine  Completed   DEXA scan (bone density measurement)  Completed   Zoster (Shingles) Vaccine  Completed   HPV Vaccine  Aged Out    Advanced directives: Will bring to the office  Conditions/risks identified: None  /Next appointment: Follow up in one year for your annual wellness visit 05/16/24 @ 8:30 /  Preventive Care 65 Years and Older, Female Preventive care refers to lifestyle choices and visits with your health care provider that can promote health and wellness. What does preventive care include? A yearly physical exam. This is also called an annual well check. Dental exams once or twice a year. Routine eye exams. Ask your health care provider how often you should have your eyes checked. Personal lifestyle choices, including: Daily care of your teeth and gums. Regular physical activity. Eating a healthy diet. Avoiding tobacco and drug use. Limiting alcohol use. Practicing safe sex. Taking low-dose aspirin every day. Taking vitamin and mineral supplements as recommended by your health care provider. What happens during an annual well check? The services and screenings done by your health care provider during your annual well check will depend on your age, overall health, lifestyle  risk factors, and family history of disease. Counseling  Your health care provider may ask you questions about your: Alcohol use. Tobacco use. Drug use. Emotional well-being. Home and relationship well-being. Sexual activity. Eating habits. History of falls. Memory and ability to understand (cognition). Work and work Astronomer. Reproductive health. Screening  You may have the following tests or measurements: Height, weight, and BMI. Blood pressure. Lipid and cholesterol levels. These may be checked every 5 years, or more frequently if you are over 39 years old. Skin check. Lung cancer screening. You may have this screening every year starting at age 17 if you have a 30-pack-year history of smoking and currently smoke or have quit within the past 15 years. Fecal occult blood test (FOBT) of the stool. You may have this test every year starting at age 71. Flexible sigmoidoscopy or colonoscopy. You may have a sigmoidoscopy every 5 years or a colonoscopy every 10 years starting at age 51. Hepatitis C blood test. Hepatitis B blood test. Sexually transmitted disease (STD) testing. Diabetes screening. This is done by checking your blood sugar (glucose) after you have not eaten for a while (fasting). You may have this done every 1-3 years. Bone density scan. This is done to screen for osteoporosis. You may have this done starting at age 15. Mammogram. This may be done every 1-2 years. Talk to your health care provider about how often you should have regular mammograms.  Talk with your health care provider about your test results, treatment options, and if necessary, the need for more tests. Vaccines  Your health care provider may recommend certain vaccines, such as: Influenza vaccine. This is recommended every year. Tetanus, diphtheria, and acellular pertussis (Tdap, Td) vaccine. You may need a Td booster every 10 years. Zoster vaccine. You may need this after age 69. Pneumococcal  13-valent conjugate (PCV13) vaccine. One dose is recommended after age 26. Pneumococcal polysaccharide (PPSV23) vaccine. One dose is recommended after age 75. Talk to your health care provider about which screenings and vaccines you need and how often you need them. This information is not intended to replace advice given to you by your health care provider. Make sure you discuss any questions you have with your health care provider. Document Released: 11/21/2015 Document Revised: 07/14/2016 Document Reviewed: 08/26/2015 Elsevier Interactive Patient Education  2017 [employer].  Fall Prevention in the Home Falls can cause injuries. They can happen to people of all ages. There are many things you can do to make your home safe and to help prevent falls. What can I do on the outside of my home? Regularly fix the edges of walkways and driveways and fix any cracks. Remove anything that might make you trip as you walk through a door, such as a raised step or threshold. Trim any bushes or trees on the path to your home. Use bright outdoor lighting. Clear any walking paths of anything that might make someone trip, such as rocks or tools. Regularly check to see if handrails are loose or broken. Make sure that both sides of any steps have handrails. Any raised decks and porches should have guardrails on the edges. Have any leaves, snow, or ice cleared regularly. Use sand or salt on walking paths during winter. Clean up any spills in your garage right away. This includes oil or grease spills. What can I do in the bathroom? Use night lights. Install grab bars by the toilet and in the tub and shower. Do not use towel bars as grab bars. Use non-skid mats or decals in the tub or shower. If you need to sit down in the shower, use a plastic, non-slip stool. Keep the floor dry. Clean up any water that spills on the floor as soon as it happens. Remove soap buildup in the tub or shower regularly. Attach  bath mats securely with double-sided non-slip rug tape. Do not have throw rugs and other things on the floor that can make you trip. What can I do in the bedroom? Use night lights. Make sure that you have a light by your bed that is easy to reach. Do not use any sheets or blankets that are too big for your bed. They should not hang down onto the floor. Have a firm chair that has side arms. You can use this for support while you get dressed. Do not have throw rugs and other things on the floor that can make you trip. What can I do in the kitchen? Clean up any spills right away. Avoid walking on wet floors. Keep items that you use a lot in easy-to-reach places. If you need to reach something above you, use a strong step stool that has a grab bar. Keep electrical cords out of the way. Do not use floor polish or wax that makes floors slippery. If you must use wax, use non-skid floor wax. Do not have throw rugs and other things on the floor that can make  you trip. What can I do with my stairs? Do not leave any items on the stairs. Make sure that there are handrails on both sides of the stairs and use them. Fix handrails that are broken or loose. Make sure that handrails are as long as the stairways. Check any carpeting to make sure that it is firmly attached to the stairs. Fix any carpet that is loose or worn. Avoid having throw rugs at the top or bottom of the stairs. If you do have throw rugs, attach them to the floor with carpet tape. Make sure that you have a light switch at the top of the stairs and the bottom of the stairs. If you do not have them, ask someone to add them for you. What else can I do to help prevent falls? Wear shoes that: Do not have high heels. Have rubber bottoms. Are comfortable and fit you well. Are closed at the toe. Do not wear sandals. If you use a stepladder: Make sure that it is fully opened. Do not climb a closed stepladder. Make sure that both sides of the  stepladder are locked into place. Ask someone to hold it for you, if possible. Clearly mark and make sure that you can see: Any grab bars or handrails. First and last steps. Where the edge of each step is. Use tools that help you move around (mobility aids) if they are needed. These include: Canes. Walkers. Scooters. Crutches. Turn on the lights when you go into a dark area. Replace any light bulbs as soon as they burn out. Set up your furniture so you have a clear path. Avoid moving your furniture around. If any of your floors are uneven, fix them. If there are any pets around you, be aware of where they are. Review your medicines with your doctor. Some medicines can make you feel dizzy. This can increase your chance of falling. Ask your doctor what other things that you can do to help prevent falls. This information is not intended to replace advice given to you by your health care provider. Make sure you discuss any questions you have with your health care provider. Document Released: 08/21/2009 Document Revised: 04/01/2016 Document Reviewed: 11/29/2014 Elsevier Interactive Patient Education  2017 [employer].

## 2023-05-30 ENCOUNTER — Other Ambulatory Visit (HOSPITAL_BASED_OUTPATIENT_CLINIC_OR_DEPARTMENT_OTHER): Payer: Self-pay

## 2023-05-31 ENCOUNTER — Other Ambulatory Visit: Payer: Self-pay

## 2023-05-31 MED FILL — Omeprazole Cap Delayed Release 20 MG: ORAL | 90 days supply | Qty: 90 | Fill #0 | Status: AC

## 2023-06-03 DIAGNOSIS — E042 Nontoxic multinodular goiter: Secondary | ICD-10-CM | POA: Diagnosis not present

## 2023-06-15 DIAGNOSIS — E0590 Thyrotoxicosis, unspecified without thyrotoxic crisis or storm: Secondary | ICD-10-CM | POA: Diagnosis not present

## 2023-06-15 DIAGNOSIS — E042 Nontoxic multinodular goiter: Secondary | ICD-10-CM | POA: Diagnosis not present

## 2023-06-16 ENCOUNTER — Other Ambulatory Visit: Payer: Self-pay

## 2023-06-16 MED ORDER — METHIMAZOLE 5 MG PO TABS
5.0000 mg | ORAL_TABLET | Freq: Every day | ORAL | 4 refills | Status: DC
  Filled 2023-06-16: qty 45, 90d supply, fill #0
  Filled 2023-08-09 – 2023-08-29 (×2): qty 45, 90d supply, fill #1
  Filled 2023-11-17: qty 45, 90d supply, fill #2
  Filled 2024-03-05: qty 45, 90d supply, fill #3
  Filled 2024-06-01: qty 45, 90d supply, fill #4

## 2023-06-20 ENCOUNTER — Other Ambulatory Visit: Payer: Self-pay

## 2023-06-22 ENCOUNTER — Encounter: Payer: Self-pay | Admitting: Emergency Medicine

## 2023-06-22 ENCOUNTER — Other Ambulatory Visit: Payer: Self-pay

## 2023-06-22 ENCOUNTER — Emergency Department
Admission: EM | Admit: 2023-06-22 | Discharge: 2023-06-22 | Disposition: A | Discharge status: Home / Self Care | Payer: Medicare Other | Attending: Emergency Medicine | Admitting: Emergency Medicine

## 2023-06-22 ENCOUNTER — Emergency Department: Payer: Medicare Other

## 2023-06-22 DIAGNOSIS — W01198A Fall on same level from slipping, tripping and stumbling with subsequent striking against other object, initial encounter: Secondary | ICD-10-CM | POA: Insufficient documentation

## 2023-06-22 DIAGNOSIS — S0101XA Laceration without foreign body of scalp, initial encounter: Secondary | ICD-10-CM | POA: Insufficient documentation

## 2023-06-22 DIAGNOSIS — S12101A Unspecified nondisplaced fracture of second cervical vertebra, initial encounter for closed fracture: Secondary | ICD-10-CM | POA: Diagnosis not present

## 2023-06-22 DIAGNOSIS — S02119A Unspecified fracture of occiput, initial encounter for closed fracture: Secondary | ICD-10-CM

## 2023-06-22 DIAGNOSIS — W19XXXA Unspecified fall, initial encounter: Secondary | ICD-10-CM

## 2023-06-22 DIAGNOSIS — E042 Nontoxic multinodular goiter: Secondary | ICD-10-CM | POA: Diagnosis not present

## 2023-06-22 DIAGNOSIS — Y9302 Activity, running: Secondary | ICD-10-CM | POA: Insufficient documentation

## 2023-06-22 DIAGNOSIS — R220 Localized swelling, mass and lump, head: Secondary | ICD-10-CM | POA: Diagnosis not present

## 2023-06-22 DIAGNOSIS — Z7982 Long term (current) use of aspirin: Secondary | ICD-10-CM | POA: Insufficient documentation

## 2023-06-22 MED ORDER — LIDOCAINE HCL (PF) 1 % IJ SOLN
5.0000 mL | Freq: Once | INTRAMUSCULAR | Status: AC
  Administered 2023-06-22: 5 mL
  Filled 2023-06-22: qty 5

## 2023-06-22 NOTE — Discharge Instructions (Addendum)
You had a fall and a laceration to your scalp.  You had 3 staples placed and you need to have these removed in 1 week by your primary care provider, urgent care or at an emergency department.  You can take Tylenol for pain control.  You had a CT scan done of your head and neck that did not show any internal bleeding, it did show a skull fracture to your occipital bone.  This should heal on its own and should not need any further intervention.  You were given the number for neurosurgery, you can call if you develop any symptoms of concussion like headache, mental fogginess or dizziness.

## 2023-06-22 NOTE — ED Notes (Signed)
Pt verbalizes understanding of discharge instructions. Opportunity for questioning and answers were provided. Pt discharged from ED to home with husband.    

## 2023-06-22 NOTE — ED Provider Notes (Addendum)
[facility] Provider Note    Event Date/Time   First MD Initiated Contact with Patient 06/22/23 Margretta Ditty     (approximate)   History   Fall   HPI  Morgan Byrd is a 87 y.o. female presents to the emergency department following a fall.  Was attempting to not let her dog outside because he runs away and fell backwards when she was leaning over hitting her head on bricks.  No loss of consciousness.  Bleeding to the back of her head.  Not on anticoagulation.  Does take a baby aspirin at nighttime.  Denies any back pain.  Ambulatory after the fall.     Physical Exam   Triage Vital Signs: ED Triage Vitals  Encounter Vitals Group     BP 06/22/23 1704 (!) 161/84     Systolic BP Percentile --      Diastolic BP Percentile --      Pulse Rate 06/22/23 1704 (!) 58     Resp 06/22/23 1704 16     Temp 06/22/23 1704 98.2 F (36.8 C)     Temp Source 06/22/23 1704 Oral     SpO2 06/22/23 1704 98 %     Weight 06/22/23 1703 136 lb (61.7 kg)     Height 06/22/23 1703 5' (1.524 m)     Head Circumference --      Peak Flow --      Pain Score 06/22/23 1701 0     Pain Loc --      Pain Education --      Exclude from Growth Chart --     Most recent vital signs: Vitals:   06/22/23 1704  BP: (!) 161/84  Pulse: (!) 58  Resp: 16  Temp: 98.2 F (36.8 C)  SpO2: 98%    Physical Exam Constitutional:      Appearance: She is well-developed.  HENT:     Head:     Comments: 3 cm scalp laceration to the occipital scalp that is hemostatic Eyes:     Conjunctiva/sclera: Conjunctivae normal.  Cardiovascular:     Rate and Rhythm: Regular rhythm.  Pulmonary:     Effort: No respiratory distress.  Abdominal:     General: There is no distension.  Musculoskeletal:        General: Normal range of motion.     Cervical back: Normal range of motion. No tenderness.     Comments: No midline tenderness to the thoracic or lumbar spine  Skin:    General: Skin is warm.   Neurological:     Mental Status: She is alert. Mental status is at baseline.      IMPRESSION / MDM / ASSESSMENT AND PLAN / ED COURSE  I reviewed the triage vital signs and the nursing notes.  Differential diagnosis including intracranial hemorrhage, scalp laceration, concussion, cervical fracture  Low suspicion for thoracic or lumbar spine fracture.  Ambulatory with no concern for hip fracture.  Tetanus is up-to-date   RADIOLOGY  my interpretation of imaging: CT scan of the head with no signs of intracranial hemorrhage.  CT scan of the cervical spine with no acute fracture  CT scan of the head read as minimally displaced fracture through the midline occipital bone.  This is present on my evaluation   Labs (all labs ordered are listed, but only abnormal results are displayed) Labs interpreted as -    Labs Reviewed - No data to display      Laceration was  repaired at bedside.  Discussed with neurosurgery Dr. Marcell Barlow for her skull fracture, recommended follow-up as needed in clinic.  Given information if she developed any symptoms of concussion for outpatient follow-up.   Given return precautions.  Discussed when staples needed to be removed and symptomatic management of pain control.   PROCEDURES:  Critical Care performed: No  ..Laceration Repair  Date/Time: 06/22/2023 8:22 PM  Performed by: Corena Herter, MD Authorized by: Corena Herter, MD   Consent:    Consent obtained:  Verbal   Consent given by:  Patient   Risks, benefits, and alternatives were discussed: yes     Risks discussed:  Pain, nerve damage, poor wound healing, poor cosmetic result, vascular damage, tendon damage, infection and need for additional repair   Alternatives discussed:  Delayed treatment and no treatment Universal protocol:    Procedure explained and questions answered to patient or proxy's satisfaction: yes     Relevant documents present and verified: yes     Test results available:  yes     Imaging studies available: yes     Required blood products, implants, devices, and special equipment available: yes     Patient identity confirmed:  Verbally with patient Anesthesia:    Anesthesia method:  Local infiltration   Local anesthetic:  Lidocaine 1% w/o epi Laceration details:    Location:  Scalp   Scalp location:  Crown   Length (cm):  3 Pre-procedure details:    Preparation:  Patient was prepped and draped in usual sterile fashion Treatment:    Amount of cleaning:  Standard   Irrigation solution:  Sterile saline   Irrigation method:  Pressure wash Skin repair:    Repair method:  Staples   Number of staples:  3 Approximation:    Approximation:  Close Repair type:    Repair type:  Simple Post-procedure details:    Dressing:  Sterile dressing   Procedure completion:  Tolerated well, no immediate complications   Patient's presentation is most consistent with acute complicated illness / injury requiring diagnostic workup.   MEDICATIONS ORDERED IN ED: Medications  lidocaine (PF) (XYLOCAINE) 1 % injection 5 mL (5 mLs Other Given 06/22/23 2009)    FINAL CLINICAL IMPRESSION(S) / ED DIAGNOSES   Final diagnoses:  Fall, initial encounter  Laceration of scalp, initial encounter     Rx / DC Orders   ED Discharge Orders     None        Note:  This document was prepared using Dragon voice recognition software and may include unintentional dictation errors.   Corena Herter, MD 06/22/23 4098    Corena Herter, MD 06/22/23 2035

## 2023-06-22 NOTE — ED Notes (Signed)
ED Provider at bedside. 

## 2023-06-22 NOTE — ED Triage Notes (Signed)
Pt via POV from home. Pt c/o mechanical fall. States she hit the back of head on a brick. Denies blood thinners. Denies LOC. States she does have a hx of C2 injury 3 years ago. Laceration to posterior aspect of pt head. Bleeding controlled. Pt is A&Ox4 and NAD, ambulatory to triage.

## 2023-06-22 NOTE — ED Notes (Addendum)
Reviewed pt's results; acuity level changed and charge nurse notified; will take pt to next available exam room for further evaluation; report given to Romie Jumper RN

## 2023-06-22 NOTE — ED Notes (Signed)
Wound irrigated with NaCl

## 2023-06-23 ENCOUNTER — Encounter (INDEPENDENT_AMBULATORY_CARE_PROVIDER_SITE_OTHER): Payer: Self-pay

## 2023-06-29 ENCOUNTER — Ambulatory Visit (INDEPENDENT_AMBULATORY_CARE_PROVIDER_SITE_OTHER): Payer: Medicare Other | Admitting: Family Medicine

## 2023-06-29 ENCOUNTER — Telehealth: Payer: Self-pay

## 2023-06-29 ENCOUNTER — Encounter: Payer: Self-pay | Admitting: Family Medicine

## 2023-06-29 VITALS — BP 128/82 | HR 55 | Temp 97.7°F | Ht 60.0 in | Wt 137.6 lb

## 2023-06-29 DIAGNOSIS — S02119A Unspecified fracture of occiput, initial encounter for closed fracture: Secondary | ICD-10-CM | POA: Diagnosis not present

## 2023-06-29 DIAGNOSIS — S0101XA Laceration without foreign body of scalp, initial encounter: Secondary | ICD-10-CM | POA: Diagnosis not present

## 2023-06-29 NOTE — Telephone Encounter (Signed)
Transition Care Management Follow-up Telephone Call Date of discharge and from where: 06/22/2023 [facility] How have you been since you were released from the hospital? Patient stated she feels fine and is completely recovered, no headaches. Any questions or concerns? No  Items Reviewed: Did the pt receive and understand the discharge instructions provided? Yes  Medications obtained and verified?  No medication prescribed. Other? No  Any new allergies since your discharge? No  Dietary orders reviewed? Yes Do you have support at home? Yes   Follow up appointments reviewed:  PCP Hospital f/u appt confirmed? Yes  Scheduled to see Morgan Byrd. MD on 06/29/2023 @ [facility]. Specialist Hospital f/u appt confirmed? No  Scheduled to see  on  @ . Are transportation arrangements needed? No  If their condition worsens, is the pt aware to call PCP or go to the Emergency Dept.? Yes Was the patient provided with contact information for the PCP's office or ED? Yes Was to pt encouraged to call back with questions or concerns? Yes  [facility]  [facility] Population Health Community Resource Care Guide   ??[email or web address]  ?? [phone]   Website: [email or web address]  Morgan Byrd

## 2023-06-29 NOTE — Assessment & Plan Note (Addendum)
Well-healing.  The area was cleansed with an alcohol swab.  3 staples were removed today.  Advised to monitor for signs of infection or pain.

## 2023-06-29 NOTE — Progress Notes (Signed)
Marikay Alar, MD Phone: [phone]  Morgan Byrd is a 87 y.o. female who presents today for f/u.  Head injury: Patient notes she fell and hit the back of her head on bricks.  She went to the emergency room.  She had no loss of consciousness.  She had 3 staples placed.  She has CT head that revealed a minimally displaced fracture midline occipital bone.  It appears the ED physician discussed this with neurosurgery and they advised as needed outpatient follow-up.  She is here today for staple removal.  She notes no headaches, vision changes, numbness, or weakness.  Social History   Tobacco Use  Smoking Status Former   Current packs/day: 0.00   Average packs/day: 1 pack/day for 4.0 years (4.0 ttl pk-yrs)   Types: Cigarettes   Start date: 79   Quit date: 26   Years since quitting: 50.6  Smokeless Tobacco Never    Current Outpatient Medications on File Prior to Visit  Medication Sig Dispense Refill   B Complex-C (SUPER B COMPLEX PO) Take 1 tablet by mouth daily.     CALCIUM-MAGNESIUM-ZINC PO Take 1 tablet by mouth daily.     diclofenac (VOLTAREN) 75 MG EC tablet TAKE 1 TABLET BY MOUTH TWICE DAILY AS NEEDED FOR MODERATE PAIN 60 tablet 0   gabapentin (NEURONTIN) 300 MG capsule Take 1 capsule (300 mg total) by mouth 3 (three) times daily. 90 capsule 5   hydrochlorothiazide (MICROZIDE) 12.5 MG capsule Take 1 capsule (12.5 mg total) by mouth daily. 90 capsule 1   irbesartan (AVAPRO) 75 MG tablet Take 1 tablet (75 mg total) by mouth daily. 30 tablet 5   irbesartan (AVAPRO) 75 MG tablet Take 1 tablet (75 mg total) by mouth daily. 30 tablet 5   lactulose (CHRONULAC) 10 GM/15ML solution Take 30 mLs (20 g total) by mouth daily as needed for mild constipation or moderate constipation. 236 mL 0   methimazole (TAPAZOLE) 5 MG tablet Take 0.5 tablets (2.5 mg total) by mouth daily. 45 tablet 3   methimazole (TAPAZOLE) 5 MG tablet Take 0.5 tablets (2.5 mg total) by mouth daily. 45 tablet 1    methimazole (TAPAZOLE) 5 MG tablet Take 0.5 tablets (2.5 mg total) by mouth once daily. 45 tablet 4   metoprolol succinate (TOPROL XL) 25 MG 24 hr tablet Take 1 tablet (25 mg total) by mouth daily. 30 tablet 5   metoprolol succinate (TOPROL-XL) 25 MG 24 hr tablet Take 1 tablet (25 mg total) by mouth daily. 30 tablet 5   Multiple Vitamin (MULTIVITAMIN) capsule Take 1 capsule by mouth daily.     omeprazole (PRILOSEC) 20 MG capsule Take 1 capsule (20 mg total) by mouth daily. 90 capsule 1   sertraline (ZOLOFT) 50 MG tablet Take 1 tablet (50 mg total) by mouth daily. 90 tablet 1   No current facility-administered medications on file prior to visit.     ROS see history of present illness  Objective  Physical Exam Vitals:   06/29/23 1107  BP: 128/82  Pulse: (!) 55  Temp: 97.7 F (36.5 C)  SpO2: 92%    BP Readings from Last 3 Encounters:  06/29/23 128/82  06/22/23 (!) 150/86  01/31/23 128/76   Wt Readings from Last 3 Encounters:  06/29/23 137 lb 9.6 oz (62.4 kg)  06/22/23 136 lb (61.7 kg)  05/13/23 138 lb (62.6 kg)    Physical Exam HENT:     Head:      Comments: Well-healing laceration as outlined,  3 staples identified, no surrounding erythema, no tenderness Pulmonary:     Effort: Pulmonary effort is normal.  Neurological:     Comments: CN 3-12 intact, 5/5 strength in bilateral biceps, triceps, grip, quads, hamstrings, plantar and dorsiflexion, sensation to light touch intact in bilateral UE and LE, normal gait      Assessment/Plan: Please see individual problem list.  Laceration of scalp, initial encounter Assessment & Plan: Well-healing.  The area was cleansed with an alcohol swab.  3 staples were removed today.  Advised to monitor for signs of infection or pain.   Closed fracture of occipital bone, unspecified laterality, unspecified occipital fracture type, initial encounter [facility]) Assessment & Plan: Patient is asymptomatic.  I will confirm with neurosurgery that  the patient does not need any intervention or follow-up for this.  Advised patient we would contact her if neurosurgery notes that she needs to be followed up for on this issue.  Advised to seek medical attention for any neurological symptoms.     Return if symptoms worsen or fail to improve.   Marikay Alar, MD [facility]

## 2023-06-29 NOTE — Patient Instructions (Signed)
Nice to see you. If you develop headaches, numbness, weakness, vision changes, drainage from your laceration, swelling at your laceration, or spreading redness please seek medical attention immediately.

## 2023-06-29 NOTE — Assessment & Plan Note (Signed)
Patient is asymptomatic.  I will confirm with neurosurgery that the patient does not need any intervention or follow-up for this.  Advised patient we would contact her if neurosurgery notes that she needs to be followed up for on this issue.  Advised to seek medical attention for any neurological symptoms.

## 2023-07-01 ENCOUNTER — Telehealth: Payer: Self-pay | Admitting: Family Medicine

## 2023-07-01 NOTE — Telephone Encounter (Signed)
Called Patient she is aware that she does not need follow up on her fracture.

## 2023-07-01 NOTE — Telephone Encounter (Signed)
-----   Message from [facility] sent at 06/29/2023  4:17 PM EDT ----- Yes, that's correct.  These types of fractures don't require any treatment or follow-up - it will heal over time.  Glad to hear she isn't having symptoms.    Annia Friendly ----- Message ----- From: Glori Luis, MD Sent: 06/29/2023  11:18 AM EDT To: Venetia Night, MD  Hi Dr Myer Haff,   I saw this patient for follow-up from recent ED visit that revealed an occipital fracture. The ED physician noted that they spoke with you and no follow-up was needed on this. I just wanted to confirm this for the patient. She is not having any symptoms. Thanks.  Minerva Areola

## 2023-07-01 NOTE — Telephone Encounter (Signed)
Please let the patient know that I heard back from neurosurgery.  They noted she did need any further follow-up on the skull fracture.

## 2023-07-03 ENCOUNTER — Other Ambulatory Visit: Payer: Self-pay

## 2023-07-04 DIAGNOSIS — H02055 Trichiasis without entropian left lower eyelid: Secondary | ICD-10-CM | POA: Diagnosis not present

## 2023-07-04 DIAGNOSIS — H04123 Dry eye syndrome of bilateral lacrimal glands: Secondary | ICD-10-CM | POA: Diagnosis not present

## 2023-07-04 DIAGNOSIS — H43811 Vitreous degeneration, right eye: Secondary | ICD-10-CM | POA: Diagnosis not present

## 2023-07-13 DIAGNOSIS — K222 Esophageal obstruction: Secondary | ICD-10-CM | POA: Diagnosis not present

## 2023-07-13 DIAGNOSIS — R1310 Dysphagia, unspecified: Secondary | ICD-10-CM | POA: Diagnosis not present

## 2023-07-13 DIAGNOSIS — K224 Dyskinesia of esophagus: Secondary | ICD-10-CM | POA: Diagnosis not present

## 2023-07-13 DIAGNOSIS — K2970 Gastritis, unspecified, without bleeding: Secondary | ICD-10-CM | POA: Diagnosis not present

## 2023-07-15 ENCOUNTER — Other Ambulatory Visit: Payer: Self-pay

## 2023-07-15 DIAGNOSIS — H6121 Impacted cerumen, right ear: Secondary | ICD-10-CM | POA: Diagnosis not present

## 2023-07-15 DIAGNOSIS — H903 Sensorineural hearing loss, bilateral: Secondary | ICD-10-CM | POA: Diagnosis not present

## 2023-07-15 DIAGNOSIS — J310 Chronic rhinitis: Secondary | ICD-10-CM | POA: Diagnosis not present

## 2023-07-15 MED ORDER — AZELASTINE HCL 0.1 % NA SOLN
1.0000 | Freq: Two times a day (BID) | NASAL | 12 refills | Status: DC
  Filled 2023-07-15: qty 30, 25d supply, fill #0
  Filled 2023-08-09: qty 30, 25d supply, fill #1

## 2023-07-20 ENCOUNTER — Telehealth: Payer: Self-pay | Admitting: Family Medicine

## 2023-07-20 NOTE — Telephone Encounter (Signed)
Patient dropped off document Handicap Placard, to be filled out by provider. Patient requested to send it back via Call Patient to pick up within 7-days. Document is located in providers tray at front office.Please advise at [facility] [phone]

## 2023-07-29 NOTE — Telephone Encounter (Signed)
Noted.  It looks like this message was not sent to me previously.  Can you contact the patient and see why she needs this?  Is she able to walk 200 feet without stopping to rest?  Does she have to use a cane or walker?

## 2023-08-01 NOTE — Telephone Encounter (Signed)
Noted. Forms signed.

## 2023-08-01 NOTE — Telephone Encounter (Signed)
Patient is aware her forms are up front ready for pick up.

## 2023-08-03 ENCOUNTER — Encounter: Payer: Self-pay | Admitting: Family Medicine

## 2023-08-03 ENCOUNTER — Telehealth: Payer: Self-pay | Admitting: Family Medicine

## 2023-08-03 ENCOUNTER — Ambulatory Visit (INDEPENDENT_AMBULATORY_CARE_PROVIDER_SITE_OTHER): Payer: Medicare Other | Admitting: Family Medicine

## 2023-08-03 ENCOUNTER — Other Ambulatory Visit: Payer: Self-pay

## 2023-08-03 VITALS — BP 118/66 | HR 57 | Temp 98.2°F | Ht 60.0 in | Wt 136.6 lb

## 2023-08-03 DIAGNOSIS — L720 Epidermal cyst: Secondary | ICD-10-CM | POA: Diagnosis not present

## 2023-08-03 DIAGNOSIS — I10 Essential (primary) hypertension: Secondary | ICD-10-CM | POA: Diagnosis not present

## 2023-08-03 DIAGNOSIS — Z08 Encounter for follow-up examination after completed treatment for malignant neoplasm: Secondary | ICD-10-CM | POA: Diagnosis not present

## 2023-08-03 DIAGNOSIS — L989 Disorder of the skin and subcutaneous tissue, unspecified: Secondary | ICD-10-CM | POA: Diagnosis not present

## 2023-08-03 DIAGNOSIS — L578 Other skin changes due to chronic exposure to nonionizing radiation: Secondary | ICD-10-CM | POA: Diagnosis not present

## 2023-08-03 DIAGNOSIS — M5432 Sciatica, left side: Secondary | ICD-10-CM

## 2023-08-03 DIAGNOSIS — D485 Neoplasm of uncertain behavior of skin: Secondary | ICD-10-CM | POA: Diagnosis not present

## 2023-08-03 DIAGNOSIS — G629 Polyneuropathy, unspecified: Secondary | ICD-10-CM | POA: Diagnosis not present

## 2023-08-03 DIAGNOSIS — Z85828 Personal history of other malignant neoplasm of skin: Secondary | ICD-10-CM | POA: Diagnosis not present

## 2023-08-03 DIAGNOSIS — Z23 Encounter for immunization: Secondary | ICD-10-CM | POA: Diagnosis not present

## 2023-08-03 DIAGNOSIS — K5909 Other constipation: Secondary | ICD-10-CM | POA: Diagnosis not present

## 2023-08-03 LAB — BASIC METABOLIC PANEL
BUN: 15 mg/dL (ref 6–23)
CO2: 31 mEq/L (ref 19–32)
Calcium: 9.4 mg/dL (ref 8.4–10.5)
Chloride: 97 mEq/L (ref 96–112)
Creatinine, Ser: 0.87 mg/dL (ref 0.40–1.20)
GFR: 61.02 mL/min (ref >60.00)
Glucose, Bld: 91 mg/dL (ref 70–99)
Potassium: 3.7 mEq/L (ref 3.5–5.1)
Sodium: 138 mEq/L (ref 135–145)

## 2023-08-03 MED ORDER — LACTULOSE 10 GM/15ML PO SOLN
20.0000 g | Freq: Every day | ORAL | 0 refills | Status: DC | PRN
  Filled 2023-08-03: qty 236, 7d supply, fill #0

## 2023-08-03 NOTE — Assessment & Plan Note (Signed)
Recent onset issue.  Discussed that this could be something that will start draining pus or could be a basal cell carcinoma.  She will contact her dermatologist to set up evaluation.  We will fax our note to the dermatologist.  She will also discuss the stitches with them if they do not come out on their own.

## 2023-08-03 NOTE — Telephone Encounter (Signed)
Please fax today's office visit note to [facility] at the attention of Dr. Adolphus Birchwood.  Patient is supposed to call them as well to set up a visit.

## 2023-08-03 NOTE — Progress Notes (Signed)
Marikay Alar, MD Phone: [phone]  Morgan Byrd is a 87 y.o. female who presents today for follow-up.  Hypertension: Typically never higher than 130s.  Taking HCTZ, irbesartan, and metoprolol.  No chest pain, shortness of breath, or edema.  Neuropathy: Continues to have some issues with this.  At times it feels as though she does not have any feet.  She notes some burning and tingling.  She takes gabapentin 300 mg twice daily.  No drowsiness with this.  Sciatica: Patient notes this is bad at times and is worse with standing or sitting on a hard surface.  She notes radiation down her leg.  She has had injections and had surgery with no benefit.  Skin lesion: This is on her right upper lip.  It started out as a hard place and then over the last several weeks it looks like it may be ready to drain something it has not drained anything.  There is some discomfort with it.  No fevers.  Patient reports occasionally having stitches come out of her right hand from a prior procedure.  She notes she mentioned this to the physician who did the procedure and they noted that they would periodically come out on their own.  Social History   Tobacco Use  Smoking Status Former   Current packs/day: 0.00   Average packs/day: 1 pack/day for 4.0 years (4.0 ttl pk-yrs)   Types: Cigarettes   Start date: 59   Quit date: 57   Years since quitting: 50.7  Smokeless Tobacco Never    Current Outpatient Medications on File Prior to Visit  Medication Sig Dispense Refill   azelastine (ASTELIN) 0.1 % nasal spray Spray 1-2 sprays into each nostril twice daily. 30 mL 12   B Complex-C (SUPER B COMPLEX PO) Take 1 tablet by mouth daily.     CALCIUM-MAGNESIUM-ZINC PO Take 1 tablet by mouth daily.     diclofenac (VOLTAREN) 75 MG EC tablet TAKE 1 TABLET BY MOUTH TWICE DAILY AS NEEDED FOR MODERATE PAIN 60 tablet 0   gabapentin (NEURONTIN) 300 MG capsule Take 1 capsule (300 mg total) by mouth 3 (three) times  daily. 90 capsule 5   hydrochlorothiazide (MICROZIDE) 12.5 MG capsule Take 1 capsule (12.5 mg total) by mouth daily. 90 capsule 1   irbesartan (AVAPRO) 75 MG tablet Take 1 tablet (75 mg total) by mouth daily. 30 tablet 5   methimazole (TAPAZOLE) 5 MG tablet Take 0.5 tablets (2.5 mg total) by mouth once daily. 45 tablet 4   metoprolol succinate (TOPROL-XL) 25 MG 24 hr tablet Take 1 tablet (25 mg total) by mouth daily. 30 tablet 5   Multiple Vitamin (MULTIVITAMIN) capsule Take 1 capsule by mouth daily.     omeprazole (PRILOSEC) 20 MG capsule Take 1 capsule (20 mg total) by mouth daily. 90 capsule 1   sertraline (ZOLOFT) 50 MG tablet Take 1 tablet (50 mg total) by mouth daily. 90 tablet 1   No current facility-administered medications on file prior to visit.     ROS see history of present illness  Objective  Physical Exam Vitals:   08/03/23 1037  BP: 118/66  Pulse: (!) 57  Temp: 98.2 F (36.8 C)  SpO2: 98%    BP Readings from Last 3 Encounters:  08/03/23 118/66  06/29/23 128/82  06/22/23 (!) 150/86   Wt Readings from Last 3 Encounters:  08/03/23 136 lb 9.6 oz (62 kg)  06/29/23 137 lb 9.6 oz (62.4 kg)  06/22/23 136 lb (61.7  kg)    Physical Exam Constitutional:      General: She is not in acute distress.    Appearance: She is not diaphoretic.  Cardiovascular:     Rate and Rhythm: Normal rate and regular rhythm.     Heart sounds: Normal heart sounds.  Pulmonary:     Effort: Pulmonary effort is normal.     Breath sounds: Normal breath sounds.  Skin:    General: Skin is warm and dry.  Neurological:     Mental Status: She is alert.    Mild tenderness, no apparent fluctuance  Assessment/Plan: Please see individual problem list.  Primary hypertension Assessment & Plan: Chronic issue.  Adequately controlled.  Patient will continue metoprolol 25 mg daily, HCTZ 12 and half milligrams daily, and irbesartan 75 mg daily.  Orders: -     Basic metabolic panel  Chronic  constipation -     Lactulose; Take 30 mLs (20 g total) by mouth daily as needed for mild constipation or moderate constipation.  Dispense: 236 mL; Refill: 0  Neuropathy Assessment & Plan: Chronic issue.  Suboptimally controlled.  Discussed she could take gabapentin 300 mg 3 times daily.  She will monitor for drowsiness on this increased frequency.   Sciatica of left side Assessment & Plan: Chronic ongoing issue.  Surgical intervention and injections were not beneficial.  We will see how she does with the increased frequency of gabapentin.   Skin lesion Assessment & Plan: Recent onset issue.  Discussed that this could be something that will start draining pus or could be a basal cell carcinoma.  She will contact her dermatologist to set up evaluation.  We will fax our note to the dermatologist.  She will also discuss the stitches with them if they do not come out on their own.   Encounter for administration of vaccine -     FLU VACCINE MDCK QUAD W/Preservative    Return in about 6 months (around 01/31/2024).   Marikay Alar, MD [facility]

## 2023-08-03 NOTE — Assessment & Plan Note (Signed)
Chronic issue.  Adequately controlled.  Patient will continue metoprolol 25 mg daily, HCTZ 12 and half milligrams daily, and irbesartan 75 mg daily.

## 2023-08-03 NOTE — Assessment & Plan Note (Signed)
Chronic ongoing issue.  Surgical intervention and injections were not beneficial.  We will see how she does with the increased frequency of gabapentin.

## 2023-08-03 NOTE — Patient Instructions (Addendum)
Nice to see you. Please call your dermatologist to schedule a visit.  We will also fax our note to them.  If you have trouble getting in to see them please let us know. Please take the gabapentin 300 mg 3 times daily.  If this is not beneficial please let me know.

## 2023-08-03 NOTE — Assessment & Plan Note (Signed)
Chronic issue.  Suboptimally controlled.  Discussed she could take gabapentin 300 mg 3 times daily.  She will monitor for drowsiness on this increased frequency.

## 2023-08-04 ENCOUNTER — Other Ambulatory Visit: Payer: Self-pay

## 2023-08-04 MED ORDER — COMIRNATY 30 MCG/0.3ML IM SUSY
PREFILLED_SYRINGE | INTRAMUSCULAR | 0 refills | Status: DC
  Filled 2023-08-04: qty 0.3, 1d supply, fill #0

## 2023-08-04 NOTE — Telephone Encounter (Signed)
Chart note routed with attention to Dr. Earna Coder

## 2023-08-08 NOTE — Addendum Note (Signed)
Addended by: Warden Fillers on: 08/08/2023 09:16 AM   Modules accepted: Orders

## 2023-08-09 ENCOUNTER — Other Ambulatory Visit: Payer: Self-pay

## 2023-08-10 ENCOUNTER — Other Ambulatory Visit: Payer: Self-pay

## 2023-08-23 DIAGNOSIS — G5601 Carpal tunnel syndrome, right upper limb: Secondary | ICD-10-CM | POA: Diagnosis not present

## 2023-08-23 DIAGNOSIS — M65331 Trigger finger, right middle finger: Secondary | ICD-10-CM | POA: Diagnosis not present

## 2023-08-29 ENCOUNTER — Other Ambulatory Visit: Payer: Self-pay

## 2023-08-29 MED FILL — Omeprazole Cap Delayed Release 20 MG: ORAL | 90 days supply | Qty: 90 | Fill #1 | Status: AC

## 2023-09-05 DIAGNOSIS — H0015 Chalazion left lower eyelid: Secondary | ICD-10-CM | POA: Diagnosis not present

## 2023-09-05 DIAGNOSIS — H43813 Vitreous degeneration, bilateral: Secondary | ICD-10-CM | POA: Diagnosis not present

## 2023-09-11 ENCOUNTER — Other Ambulatory Visit: Payer: Self-pay

## 2023-09-16 DIAGNOSIS — D2261 Melanocytic nevi of right upper limb, including shoulder: Secondary | ICD-10-CM | POA: Diagnosis not present

## 2023-09-16 DIAGNOSIS — D2262 Melanocytic nevi of left upper limb, including shoulder: Secondary | ICD-10-CM | POA: Diagnosis not present

## 2023-09-16 DIAGNOSIS — Z85828 Personal history of other malignant neoplasm of skin: Secondary | ICD-10-CM | POA: Diagnosis not present

## 2023-09-16 DIAGNOSIS — D2272 Melanocytic nevi of left lower limb, including hip: Secondary | ICD-10-CM | POA: Diagnosis not present

## 2023-09-16 DIAGNOSIS — L821 Other seborrheic keratosis: Secondary | ICD-10-CM | POA: Diagnosis not present

## 2023-09-16 DIAGNOSIS — L570 Actinic keratosis: Secondary | ICD-10-CM | POA: Diagnosis not present

## 2023-09-16 DIAGNOSIS — D225 Melanocytic nevi of trunk: Secondary | ICD-10-CM | POA: Diagnosis not present

## 2023-09-16 DIAGNOSIS — D2271 Melanocytic nevi of right lower limb, including hip: Secondary | ICD-10-CM | POA: Diagnosis not present

## 2023-09-28 ENCOUNTER — Other Ambulatory Visit: Payer: Self-pay

## 2023-10-13 ENCOUNTER — Other Ambulatory Visit: Payer: Self-pay

## 2023-10-24 ENCOUNTER — Other Ambulatory Visit: Payer: Self-pay | Admitting: Family Medicine

## 2023-10-24 ENCOUNTER — Other Ambulatory Visit: Payer: Self-pay

## 2023-10-24 DIAGNOSIS — I10 Essential (primary) hypertension: Secondary | ICD-10-CM

## 2023-10-24 MED ORDER — HYDROCHLOROTHIAZIDE 12.5 MG PO CAPS
12.5000 mg | ORAL_CAPSULE | Freq: Every day | ORAL | 1 refills | Status: DC
  Filled 2023-10-24: qty 90, 90d supply, fill #0
  Filled 2023-11-17 – 2024-01-22 (×2): qty 90, 90d supply, fill #1

## 2023-11-17 ENCOUNTER — Other Ambulatory Visit: Payer: Self-pay | Admitting: Family Medicine

## 2023-11-17 ENCOUNTER — Other Ambulatory Visit: Payer: Self-pay

## 2023-11-17 DIAGNOSIS — G629 Polyneuropathy, unspecified: Secondary | ICD-10-CM

## 2023-11-17 MED ORDER — OMEPRAZOLE 20 MG PO CPDR
20.0000 mg | DELAYED_RELEASE_CAPSULE | Freq: Every day | ORAL | 1 refills | Status: DC
  Filled 2023-11-17 – 2023-12-12 (×2): qty 90, 90d supply, fill #0
  Filled 2024-03-09: qty 90, 90d supply, fill #1

## 2023-11-17 MED ORDER — SERTRALINE HCL 50 MG PO TABS
50.0000 mg | ORAL_TABLET | Freq: Every day | ORAL | 1 refills | Status: DC
  Filled 2023-11-17 – 2023-12-12 (×2): qty 90, 90d supply, fill #0
  Filled 2024-03-09: qty 90, 90d supply, fill #1

## 2023-11-17 MED ORDER — GABAPENTIN 300 MG PO CAPS
300.0000 mg | ORAL_CAPSULE | Freq: Three times a day (TID) | ORAL | 5 refills | Status: DC
  Filled 2023-11-17 – 2023-12-21 (×2): qty 90, 30d supply, fill #0
  Filled 2024-01-22: qty 270, 90d supply, fill #1
  Filled 2024-03-05: qty 270, 90d supply, fill #2
  Filled 2024-03-12: qty 180, 90d supply, fill #2
  Filled 2024-03-20: qty 180, 60d supply, fill #2
  Filled ????-??-??: fill #2

## 2023-12-12 ENCOUNTER — Other Ambulatory Visit: Payer: Self-pay

## 2023-12-12 DIAGNOSIS — S9031XA Contusion of right foot, initial encounter: Secondary | ICD-10-CM | POA: Diagnosis not present

## 2023-12-12 DIAGNOSIS — S90111A Contusion of right great toe without damage to nail, initial encounter: Secondary | ICD-10-CM | POA: Diagnosis not present

## 2023-12-12 DIAGNOSIS — M79674 Pain in right toe(s): Secondary | ICD-10-CM | POA: Diagnosis not present

## 2023-12-12 DIAGNOSIS — L600 Ingrowing nail: Secondary | ICD-10-CM | POA: Diagnosis not present

## 2023-12-12 DIAGNOSIS — M79675 Pain in left toe(s): Secondary | ICD-10-CM | POA: Diagnosis not present

## 2023-12-19 ENCOUNTER — Other Ambulatory Visit: Payer: Self-pay

## 2023-12-20 ENCOUNTER — Other Ambulatory Visit: Payer: Self-pay

## 2023-12-20 DIAGNOSIS — D485 Neoplasm of uncertain behavior of skin: Secondary | ICD-10-CM | POA: Diagnosis not present

## 2023-12-20 DIAGNOSIS — D044 Carcinoma in situ of skin of scalp and neck: Secondary | ICD-10-CM | POA: Diagnosis not present

## 2023-12-20 DIAGNOSIS — L570 Actinic keratosis: Secondary | ICD-10-CM | POA: Diagnosis not present

## 2023-12-21 ENCOUNTER — Other Ambulatory Visit: Payer: Self-pay

## 2023-12-22 ENCOUNTER — Other Ambulatory Visit: Payer: Self-pay

## 2023-12-22 MED ORDER — IRBESARTAN 75 MG PO TABS
75.0000 mg | ORAL_TABLET | Freq: Every day | ORAL | 0 refills | Status: DC
  Filled 2023-12-22: qty 30, 30d supply, fill #0

## 2023-12-22 MED ORDER — METOPROLOL SUCCINATE ER 25 MG PO TB24
25.0000 mg | ORAL_TABLET | Freq: Every day | ORAL | 0 refills | Status: DC
  Filled 2023-12-22 – 2024-01-08 (×2): qty 30, 30d supply, fill #0

## 2023-12-22 NOTE — Addendum Note (Signed)
Addended by: Guerry Minors on: 12/22/2023 02:48 PM   Modules accepted: Orders

## 2023-12-22 NOTE — Telephone Encounter (Addendum)
Last visit: 09/21/22 with follow up plan PRN  next visit: none/No active recall   Requested Prescriptions   Signed Prescriptions Disp Refills   metoprolol succinate (TOPROL-XL) 25 MG 24 hr tablet 30 tablet 0    Sig: Take 1 tablet (25 mg total) by mouth daily. Please schedule visit for future refills or request from primary care provider. (First attempt)    Authorizing Provider: Debbe Odea    Ordering User: Feliberto Harts L   irbesartan (AVAPRO) 75 MG tablet 30 tablet 0    Sig: Take 1 tablet (75 mg total) by mouth daily. Please schedule visit for future refills or request from primary care provider. (First attempt)    Authorizing Provider: Debbe Odea    Ordering User: Guerry Minors

## 2023-12-28 ENCOUNTER — Other Ambulatory Visit: Payer: Self-pay

## 2023-12-28 DIAGNOSIS — D044 Carcinoma in situ of skin of scalp and neck: Secondary | ICD-10-CM | POA: Diagnosis not present

## 2023-12-30 ENCOUNTER — Other Ambulatory Visit: Payer: Self-pay

## 2023-12-30 ENCOUNTER — Ambulatory Visit: Payer: Medicare Other | Admitting: Nurse Practitioner

## 2023-12-30 ENCOUNTER — Encounter: Payer: Self-pay | Admitting: Nurse Practitioner

## 2023-12-30 VITALS — BP 126/76 | HR 63 | Temp 97.4°F | Ht 60.0 in | Wt 141.6 lb

## 2023-12-30 DIAGNOSIS — L299 Pruritus, unspecified: Secondary | ICD-10-CM | POA: Diagnosis not present

## 2023-12-30 DIAGNOSIS — M48061 Spinal stenosis, lumbar region without neurogenic claudication: Secondary | ICD-10-CM | POA: Diagnosis not present

## 2023-12-30 MED ORDER — PREDNISONE 10 MG PO TABS
ORAL_TABLET | ORAL | 0 refills | Status: DC
  Filled 2023-12-30: qty 17, 6d supply, fill #0

## 2023-12-30 MED ORDER — DICLOFENAC SODIUM 75 MG PO TBEC
DELAYED_RELEASE_TABLET | ORAL | 0 refills | Status: DC
  Filled 2023-12-30: qty 60, 60d supply, fill #0

## 2023-12-30 NOTE — Assessment & Plan Note (Addendum)
New onset of generalized itching with rash, started last weekend. Numerous scattered, erythematic, raised papules to the back and abdomen.  Noted change in detergent last week, but symptoms persisted despite rewashing clothes and linens. No changes in medications. No other systemic symptoms. -Start Prednisone tapering dose. -Use cold compresses to help relieve itching. -Check for improvement and report if not improving.

## 2023-12-30 NOTE — Progress Notes (Signed)
Established Patient Office Visit  Subjective:  Patient ID: Morgan Byrd, female    DOB: October 25, 1936  Age: 88 y.o. MRN: [id number]  CC:  Chief Complaint  Patient presents with   Acute Visit    Itching everywhere x 1 week Using cortisone cream   Discussed the use of a AI scribe software for clinical note transcription with the patient, who gave verbal consent to proceed.  HPI  Morgan Byrd is an 88 year old female who presents with generalized itching and rash.  She has been experiencing severe generalized itching since last weekend, affecting multiple areas including her back, under her breasts, abdomen, and legs. The itching is so intense that she describes it as feeling like she could 'claw the blood out of her.' Despite using a whole tube of cortisone 10 (1% hydrocortisone) on her legs, the itching persists.  There was a recent change in detergent to Cheer, used once in the middle of last week. After noticing the itching, she rewashed all her clothes and linens. She is uncertain if the rash started before or after the detergent change. The itching has not improved and is possibly worsening, particularly on her legs.  No changes in her medications. She reports no shortness of breath, chest pain, or other systemic symptoms. Her bowel movements are normal, and she is eating and drinking normally.  She is requesting refill of Diclofenac.   HPI   Past Medical History:  Diagnosis Date   Anxiety    Arthritis    C2 cervical fracture (HCC) 11/14/2019   Cancer (HCC)    Complication of anesthesia    Heart murmur    History of closed fracture of nasal bones 10/11/2019   Hypertension    Lung nodule 04/05/2019   CT scan completed May 2020 recommends repeating in 12 months   Nasal fracture 11/14/2019   PONV (postoperative nausea and vomiting)     Past Surgical History:  Procedure Laterality Date   ABDOMINAL HYSTERECTOMY     BLADDER SUSPENSION     BREAST BIOPSY Right 1978    neg   cancer cell removed from hand  04/2023   COLONOSCOPY WITH PROPOFOL N/A 12/26/2015   Procedure: COLONOSCOPY WITH PROPOFOL;  Surgeon: Scot Jun, MD;  Location: [facility] ENDOSCOPY;  Service: Endoscopy;  Laterality: N/A;   ESOPHAGOGASTRODUODENOSCOPY N/A 09/27/2022   Procedure: ESOPHAGOGASTRODUODENOSCOPY (EGD);  Surgeon: Jaynie Collins, DO;  Location: [facility] ENDOSCOPY;  Service: Gastroenterology;  Laterality: N/A;   LUMBAR LAMINECTOMY/DECOMPRESSION MICRODISCECTOMY Left 07/20/2021   Procedure: OPEN L4/5 LEFT HEMILAMINECTOMY & DISCECTOMY;  Surgeon: Lucy Chris, MD;  Location: [facility] ORS;  Service: Neurosurgery;  Laterality: Left;   NOSE SURGERY     REPLACEMENT TOTAL KNEE BILATERAL     SHOULDER SURGERY     VAGINAL DELIVERY     2    Family History  Problem Relation Age of Onset   Dementia Mother    Arthritis Mother    Heart disease Father    Hypertension Father    Cancer Father        prostate   Diabetes Father    Diabetes Sister    Cancer Sister        breast   Breast cancer Sister 19   Heart disease Sister    Vasculitis Son    Kidney disease Son    Pneumonia Son    Breast cancer Cousin 103    Social History   Socioeconomic History   Marital status: Widowed  Spouse name: Not on file   Number of children: Not on file   Years of education: Not on file   Highest education level: Not on file  Occupational History   Not on file  Tobacco Use   Smoking status: Former    Current packs/day: 0.00    Average packs/day: 1 pack/day for 4.0 years (4.0 ttl pk-yrs)    Types: Cigarettes    Start date: 29    Quit date: 27    Years since quitting: 51.1   Smokeless tobacco: Never  Vaping Use   Vaping status: Never Used  Substance and Sexual Activity   Alcohol use: No   Drug use: No   Sexual activity: Never  Other Topics Concern   Not on file  Social History Narrative   Lives in [place]. Son and daughter live nearby.Work - retired Psychiatric nurse - healthy,  Art therapist - housework   Social Drivers of Corporate investment banker Strain: Low Risk  (05/13/2023)   Overall Financial Resource Strain (CARDIA)    Difficulty of Paying Living Expenses: Not hard at all  Food Insecurity: No Food Insecurity (05/13/2023)   Hunger Vital Sign    Worried About Running Out of Food in the Last Year: Never true    Ran Out of Food in the Last Year: Never true  Transportation Needs: No Transportation Needs (05/13/2023)   PRAPARE - Administrator, Civil Service (Medical): No    Lack of Transportation (Non-Medical): No  Physical Activity: Inactive (05/13/2023)   Exercise Vital Sign    Days of Exercise per Week: 0 days    Minutes of Exercise per Session: 0 min  Stress: No Stress Concern Present (05/13/2023)   [employer] of Occupational Health - Occupational Stress Questionnaire    Feeling of Stress : Not at all  Social Connections: Moderately Integrated (05/13/2023)   Social Connection and Isolation Panel [NHANES]    Frequency of Communication with Friends and Family: More than three times a week    Frequency of Social Gatherings with Friends and Family: More than three times a week    Attends Religious Services: More than 4 times per year    Active Member of [employer] or Organizations: Yes    Attends Banker Meetings: More than 4 times per year    Marital Status: Widowed  Intimate Partner Violence: Not At Risk (05/13/2023)   Humiliation, Afraid, Rape, and Kick questionnaire    Fear of Current or Ex-Partner: No    Emotionally Abused: No    Physically Abused: No    Sexually Abused: No     Outpatient Medications Prior to Visit  Medication Sig Dispense Refill   azelastine (ASTELIN) 0.1 % nasal spray Spray 1-2 sprays into each nostril twice daily. 30 mL 12   B Complex-C (SUPER B COMPLEX PO) Take 1 tablet by mouth daily.     CALCIUM-MAGNESIUM-ZINC PO Take 1 tablet by mouth daily.     COVID-19 mRNA vaccine, Pfizer, (COMIRNATY) syringe  Inject into the muscle. 0.3 mL 0   gabapentin (NEURONTIN) 300 MG capsule Take 1 capsule (300 mg total) by mouth 3 (three) times daily. 90 capsule 5   hydrochlorothiazide (MICROZIDE) 12.5 MG capsule Take 1 capsule (12.5 mg total) by mouth daily. 90 capsule 1   irbesartan (AVAPRO) 75 MG tablet Take 1 tablet (75 mg total) by mouth daily. Please schedule visit for future refills or request from primary care provider. (First attempt) 30 tablet 0   lactulose (  CHRONULAC) 10 GM/15ML solution Take 30 mLs (20 g total) by mouth daily as needed for mild constipation or moderate constipation. 236 mL 0   methimazole (TAPAZOLE) 5 MG tablet Take 0.5 tablets (2.5 mg total) by mouth once daily. 45 tablet 4   metoprolol succinate (TOPROL-XL) 25 MG 24 hr tablet Take 1 tablet (25 mg total) by mouth daily. Please schedule visit for future refills or request from primary care provider. (First attempt) 30 tablet 0   Multiple Vitamin (MULTIVITAMIN) capsule Take 1 capsule by mouth daily.     omeprazole (PRILOSEC) 20 MG capsule Take 1 capsule (20 mg total) by mouth daily. 90 capsule 1   sertraline (ZOLOFT) 50 MG tablet Take 1 tablet (50 mg total) by mouth daily. 90 tablet 1   diclofenac (VOLTAREN) 75 MG EC tablet TAKE 1 TABLET BY MOUTH TWICE DAILY AS NEEDED FOR MODERATE PAIN 60 tablet 0   No facility-administered medications prior to visit.    Allergies  Allergen Reactions   Codeine Nausea And Vomiting   Fosamax [Alendronate Sodium]     Couldn't walk   Lexapro [Escitalopram Oxalate]     Unknown reaction   Spiriva Respimat [Tiotropium Bromide Monohydrate]     dizziness    ROS Review of Systems Negative unless indicated in HPI.    Objective:    Physical Exam Constitutional:      Appearance: Normal appearance.  Cardiovascular:     Rate and Rhythm: Normal rate and regular rhythm.     Pulses: Normal pulses.     Heart sounds: Normal heart sounds.  Pulmonary:     Effort: Pulmonary effort is normal.      Breath sounds: Normal breath sounds.  Musculoskeletal:     Cervical back: Normal range of motion.  Skin:    Findings: Erythema and rash (Numerous scattered, erythematic, raised papules to the back and abdomen) present.  Neurological:     General: No focal deficit present.     Mental Status: She is alert. Mental status is at baseline.  Psychiatric:        Mood and Affect: Mood normal.        Behavior: Behavior normal.        Thought Content: Thought content normal.        Judgment: Judgment normal.     BP 126/76   Pulse 63   Temp (!) 97.4 F (36.3 C)   Ht 5' (1.524 m)   Wt 141 lb 9.6 oz (64.2 kg)   SpO2 93%   BMI 27.65 kg/m  Wt Readings from Last 3 Encounters:  12/30/23 141 lb 9.6 oz (64.2 kg)  08/03/23 136 lb 9.6 oz (62 kg)  06/29/23 137 lb 9.6 oz (62.4 kg)     Health Maintenance  Topic Date Due   Medicare Annual Wellness (AWV)  05/12/2024   DTaP/Tdap/Td (3 - Td or Tdap) 10/03/2029   Pneumonia Vaccine 34+ Years old  Completed   INFLUENZA VACCINE  Completed   DEXA SCAN  Completed   COVID-19 Vaccine  Completed   Zoster Vaccines- Shingrix  Completed   HPV VACCINES  Aged Out    There are no preventive care reminders to display for this patient.  Lab Results  Component Value Date   TSH 1.59 03/19/2022   Lab Results  Component Value Date   WBC 6.8 04/25/2022   HGB 13.1 04/25/2022   HCT 40.5 04/25/2022   MCV 94.4 04/25/2022   PLT 248 04/25/2022   Lab Results  Component  Value Date   NA 138 08/03/2023   K 3.7 08/03/2023   CO2 31 08/03/2023   GLUCOSE 91 08/03/2023   BUN 15 08/03/2023   CREATININE 0.87 08/03/2023   BILITOT 0.5 01/31/2023   ALKPHOS 70 01/31/2023   AST 24 01/31/2023   ALT 14 01/31/2023   PROT 7.2 01/31/2023   ALBUMIN 4.2 01/31/2023   CALCIUM 9.4 08/03/2023   ANIONGAP 10 04/25/2022   GFR 61.02 08/03/2023   Lab Results  Component Value Date   CHOL 213 (H) 01/31/2023   Lab Results  Component Value Date   HDL 63.30 01/31/2023   Lab  Results  Component Value Date   LDLCALC 124 (H) 01/31/2023   Lab Results  Component Value Date   TRIG 131.0 01/31/2023   Lab Results  Component Value Date   CHOLHDL 3 01/31/2023   Lab Results  Component Value Date   HGBA1C 5.8 01/31/2023      Assessment & Plan:  Pruritus Assessment & Plan: New onset of generalized itching with rash, started last weekend. Numerous scattered, erythematic, raised papules to the back and abdomen.  Noted change in detergent last week, but symptoms persisted despite rewashing clothes and linens. No changes in medications. No other systemic symptoms. -Start Prednisone tapering dose. -Use cold compresses to help relieve itching. -Check for improvement and report if not improving.   Spinal stenosis of lumbar region, unspecified whether neurogenic claudication present -     Diclofenac Sodium; TAKE 1 TABLET BY MOUTH TWICE DAILY AS NEEDED FOR MODERATE PAIN  Dispense: 60 tablet; Refill: 0  Other orders -     predniSONE; Take 4 tablets ( total 40 mg) by mouth for 2 days; take 3 tablets ( total 30 mg) by mouth for 2 days; take 2 tablets ( total 20 mg) by mouth for 1 day; take 1 tablet ( total 10 mg) by mouth for 1 day.  Dispense: 17 tablet; Refill: 0    Follow-up: Return if symptoms worsen or fail to improve.   Kara Dies, NP

## 2024-01-08 ENCOUNTER — Other Ambulatory Visit: Payer: Self-pay

## 2024-01-15 ENCOUNTER — Other Ambulatory Visit: Payer: Self-pay

## 2024-01-15 ENCOUNTER — Other Ambulatory Visit: Payer: Self-pay | Admitting: Nurse Practitioner

## 2024-01-15 DIAGNOSIS — M48061 Spinal stenosis, lumbar region without neurogenic claudication: Secondary | ICD-10-CM

## 2024-01-16 ENCOUNTER — Other Ambulatory Visit: Payer: Self-pay | Admitting: Nurse Practitioner

## 2024-01-16 ENCOUNTER — Encounter: Payer: Self-pay | Admitting: Pharmacist

## 2024-01-16 ENCOUNTER — Other Ambulatory Visit: Payer: Self-pay

## 2024-01-16 DIAGNOSIS — M48061 Spinal stenosis, lumbar region without neurogenic claudication: Secondary | ICD-10-CM

## 2024-01-17 ENCOUNTER — Encounter: Payer: Self-pay | Admitting: Nurse Practitioner

## 2024-01-17 ENCOUNTER — Other Ambulatory Visit: Payer: Self-pay | Admitting: Nurse Practitioner

## 2024-01-17 ENCOUNTER — Ambulatory Visit (INDEPENDENT_AMBULATORY_CARE_PROVIDER_SITE_OTHER): Payer: Medicare Other | Admitting: Nurse Practitioner

## 2024-01-17 ENCOUNTER — Other Ambulatory Visit: Payer: Self-pay

## 2024-01-17 VITALS — BP 130/66 | HR 62 | Temp 97.9°F | Ht 60.0 in | Wt 136.8 lb

## 2024-01-17 DIAGNOSIS — R7303 Prediabetes: Secondary | ICD-10-CM | POA: Insufficient documentation

## 2024-01-17 DIAGNOSIS — F419 Anxiety disorder, unspecified: Secondary | ICD-10-CM

## 2024-01-17 DIAGNOSIS — Z13220 Encounter for screening for lipoid disorders: Secondary | ICD-10-CM | POA: Diagnosis not present

## 2024-01-17 DIAGNOSIS — E0590 Thyrotoxicosis, unspecified without thyrotoxic crisis or storm: Secondary | ICD-10-CM

## 2024-01-17 DIAGNOSIS — G629 Polyneuropathy, unspecified: Secondary | ICD-10-CM

## 2024-01-17 DIAGNOSIS — M48061 Spinal stenosis, lumbar region without neurogenic claudication: Secondary | ICD-10-CM

## 2024-01-17 DIAGNOSIS — I10 Essential (primary) hypertension: Secondary | ICD-10-CM

## 2024-01-17 DIAGNOSIS — M5432 Sciatica, left side: Secondary | ICD-10-CM | POA: Diagnosis not present

## 2024-01-17 LAB — CBC WITH DIFFERENTIAL/PLATELET
Basophils Absolute: 0.1 10*3/uL (ref 0.0–0.1)
Basophils Relative: 1.1 % (ref 0.0–3.0)
Eosinophils Absolute: 0.3 10*3/uL (ref 0.0–0.7)
Eosinophils Relative: 4.5 % (ref 0.0–5.0)
HCT: 42.2 % (ref 36.0–46.0)
Hemoglobin: 13.9 g/dL (ref 12.0–15.0)
Lymphocytes Relative: 21.6 % (ref 12.0–46.0)
Lymphs Abs: 1.7 10*3/uL (ref 0.7–4.0)
MCHC: 32.9 g/dL (ref 30.0–36.0)
MCV: 96.8 fl (ref 78.0–100.0)
Monocytes Absolute: 0.6 10*3/uL (ref 0.1–1.0)
Monocytes Relative: 7.7 % (ref 3.0–12.0)
Neutro Abs: 5 10*3/uL (ref 1.4–7.7)
Neutrophils Relative %: 65.1 % (ref 43.0–77.0)
Platelets: 273 10*3/uL (ref 150.0–400.0)
RBC: 4.36 Mil/uL (ref 3.87–5.11)
RDW: 13.3 % (ref 11.5–15.5)
WBC: 7.7 10*3/uL (ref 4.0–10.5)

## 2024-01-17 LAB — COMPREHENSIVE METABOLIC PANEL
ALT: 18 U/L (ref 0–35)
AST: 25 U/L (ref 0–37)
Albumin: 4.4 g/dL (ref 3.5–5.2)
Alkaline Phosphatase: 77 U/L (ref 39–117)
BUN: 15 mg/dL (ref 6–23)
CO2: 32 meq/L (ref 19–32)
Calcium: 9.9 mg/dL (ref 8.4–10.5)
Chloride: 98 meq/L (ref 96–112)
Creatinine, Ser: 0.82 mg/dL (ref 0.40–1.20)
GFR: 65.3 mL/min (ref >60.00)
Glucose, Bld: 90 mg/dL (ref 70–99)
Potassium: 4 meq/L (ref 3.5–5.1)
Sodium: 139 meq/L (ref 135–145)
Total Bilirubin: 0.8 mg/dL (ref 0.2–1.2)
Total Protein: 7.4 g/dL (ref 6.0–8.3)

## 2024-01-17 LAB — LIPID PANEL
Cholesterol: 228 mg/dL — ABNORMAL HIGH (ref 0–200)
HDL: 74.5 mg/dL (ref >39.00)
LDL Cholesterol: 137 mg/dL — ABNORMAL HIGH (ref 0–99)
NonHDL: 153.27
Total CHOL/HDL Ratio: 3
Triglycerides: 81 mg/dL (ref 0.0–149.0)
VLDL: 16.2 mg/dL (ref 0.0–40.0)

## 2024-01-17 LAB — TSH: TSH: 1.35 u[IU]/mL (ref 0.35–5.50)

## 2024-01-17 LAB — HEMOGLOBIN A1C: Hgb A1c MFr Bld: 5.8 % (ref 4.6–6.5)

## 2024-01-17 NOTE — Assessment & Plan Note (Signed)
 Anxiety is effectively managed with Zoloft, with no current symptoms. She notices a difference on missed doses. Continue Zoloft.

## 2024-01-17 NOTE — Assessment & Plan Note (Signed)
 Neuropathy and sciatica are more severe on the left side, managed with gabapentin and diclofenac. Continue current medication regimen.

## 2024-01-17 NOTE — Assessment & Plan Note (Signed)
 Last A1c- 5.8. We will check A1c today. Encouraged healthy diet and exercise as tolerated.

## 2024-01-17 NOTE — Assessment & Plan Note (Addendum)
 Neuropathy and sciatica are more severe on the left side, managed with gabapentin and diclofenac. She has tried surgical interventions and injections in the past without relief. Continue current medication regimen.

## 2024-01-17 NOTE — Assessment & Plan Note (Signed)
 Hyperthyroidism is managed with tapazole without reported symptoms. She is under endocrinologist care. Continue tapazole and follow up with Endo in August as scheduled. We will check TSH today.

## 2024-01-17 NOTE — Progress Notes (Signed)
 Bethanie Dicker, NP-C Phone: [phone]  Morgan Byrd is a 88 y.o. female who presents today for transfer of care.   Discussed the use of AI scribe software for clinical note transcription with the patient, who gave verbal consent to proceed.  History of Present Illness   The patient presents for a transfer of care visit.  She is currently on three antihypertensive medications: hydrochlorothiazide, metoprolol XL, and irbesartan. She does not frequently monitor her blood pressure at home. No symptoms such as chest pain, shortness of breath, dizziness, or leg swelling are present.  She is being treated for hyperthyroidism with tapazole. She experiences no heart palpitations, skin changes, or issues with temperature regulation.  She takes gabapentin for neuropathy and sciatica, with more pronounced numbness and pain in her left foot. The right side is affected to a lesser extent.  She is on Zoloft for mood management, which she started after her husband's passing. It effectively controls her mood, and she notices a difference on days she does not take it.  No new problems or concerns have arisen since her last visit.      Social History   Tobacco Use  Smoking Status Former   Current packs/day: 0.00   Average packs/day: 1 pack/day for 4.0 years (4.0 ttl pk-yrs)   Types: Cigarettes   Start date: 74   Quit date: 18   Years since quitting: 51.2  Smokeless Tobacco Never    Current Outpatient Medications on File Prior to Visit  Medication Sig Dispense Refill   B Complex-C (SUPER B COMPLEX PO) Take 1 tablet by mouth daily.     CALCIUM-MAGNESIUM-ZINC PO Take 1 tablet by mouth daily.     diclofenac (VOLTAREN) 75 MG EC tablet TAKE 1 TABLET BY MOUTH TWICE DAILY AS NEEDED FOR MODERATE PAIN 60 tablet 0   gabapentin (NEURONTIN) 300 MG capsule Take 1 capsule (300 mg total) by mouth 3 (three) times daily. 90 capsule 5   hydrochlorothiazide (MICROZIDE) 12.5 MG capsule Take 1 capsule (12.5  mg total) by mouth daily. 90 capsule 1   irbesartan (AVAPRO) 75 MG tablet Take 1 tablet (75 mg total) by mouth daily. Please schedule visit for future refills or request from primary care provider. (First attempt) 30 tablet 0   lactulose (CHRONULAC) 10 GM/15ML solution Take 30 mLs (20 g total) by mouth daily as needed for mild constipation or moderate constipation. 236 mL 0   methimazole (TAPAZOLE) 5 MG tablet Take 0.5 tablets (2.5 mg total) by mouth once daily. 45 tablet 4   metoprolol succinate (TOPROL-XL) 25 MG 24 hr tablet Take 1 tablet (25 mg total) by mouth daily. Please schedule visit for future refills or request from primary care provider. (First attempt) 30 tablet 0   Multiple Vitamin (MULTIVITAMIN) capsule Take 1 capsule by mouth daily.     omeprazole (PRILOSEC) 20 MG capsule Take 1 capsule (20 mg total) by mouth daily. 90 capsule 1   sertraline (ZOLOFT) 50 MG tablet Take 1 tablet (50 mg total) by mouth daily. 90 tablet 1   No current facility-administered medications on file prior to visit.    ROS see history of present illness  Objective  Physical Exam Vitals:   01/17/24 0910  BP: 130/66  Pulse: 62  Temp: 97.9 F (36.6 C)  SpO2: 99%    BP Readings from Last 3 Encounters:  01/17/24 130/66  12/30/23 126/76  08/03/23 118/66   Wt Readings from Last 3 Encounters:  01/17/24 136 lb 12.8 oz (62.1  kg)  12/30/23 141 lb 9.6 oz (64.2 kg)  08/03/23 136 lb 9.6 oz (62 kg)    Physical Exam Constitutional:      General: She is not in acute distress.    Appearance: Normal appearance.  HENT:     Head: Normocephalic.  Cardiovascular:     Rate and Rhythm: Normal rate and regular rhythm.     Heart sounds: Normal heart sounds.  Pulmonary:     Effort: Pulmonary effort is normal.     Breath sounds: Normal breath sounds.  Skin:    General: Skin is warm and dry.  Neurological:     General: No focal deficit present.     Mental Status: She is alert.  Psychiatric:        Mood  and Affect: Mood normal.        Behavior: Behavior normal.     Assessment/Plan: Please see individual problem list.  Primary hypertension Assessment & Plan: Hypertension is well controlled with hydrochlorothiazide 12.5 mg daily, Toprol XL 25 mg daily, and irbesartan 75 mg daily. She reports no symptoms and is under cardiologist care. Continue current medication regimen. Lab work as outlined. Follow up with Cardiology as scheduled.   Orders: -     CBC with Differential/Platelet -     Comprehensive metabolic panel  Subclinical hyperthyroidism Assessment & Plan: Hyperthyroidism is managed with tapazole without reported symptoms. She is under endocrinologist care. Continue tapazole and follow up with Endo in August as scheduled. We will check TSH today.   Orders: -     TSH  Anxiety Assessment & Plan: Anxiety is effectively managed with Zoloft, with no current symptoms. She notices a difference on missed doses. Continue Zoloft.   Neuropathy Assessment & Plan: Neuropathy and sciatica are more severe on the left side, managed with gabapentin and diclofenac. Continue current medication regimen.    Sciatica of left side Assessment & Plan: Neuropathy and sciatica are more severe on the left side, managed with gabapentin and diclofenac. She has tried surgical interventions and injections in the past without relief. Continue current medication regimen.   Prediabetes Assessment & Plan: Last A1c- 5.8. We will check A1c today. Encouraged healthy diet and exercise as tolerated.   Orders: -     Hemoglobin A1c  Lipid screening -     Lipid panel    Return in about 6 months (around 07/19/2024) for Follow up.   Bethanie Dicker, NP-C [facility]

## 2024-01-17 NOTE — Assessment & Plan Note (Addendum)
 Hypertension is well controlled with hydrochlorothiazide 12.5 mg daily, Toprol XL 25 mg daily, and irbesartan 75 mg daily. She reports no symptoms and is under cardiologist care. Continue current medication regimen. Lab work as outlined. Follow up with Cardiology as scheduled.

## 2024-01-19 NOTE — Telephone Encounter (Signed)
 Please call pt and check regarding the refill.

## 2024-01-20 ENCOUNTER — Other Ambulatory Visit: Payer: Self-pay

## 2024-01-22 ENCOUNTER — Other Ambulatory Visit: Payer: Self-pay

## 2024-01-23 ENCOUNTER — Other Ambulatory Visit: Payer: Self-pay

## 2024-01-23 ENCOUNTER — Other Ambulatory Visit: Payer: Self-pay | Admitting: Nurse Practitioner

## 2024-01-23 DIAGNOSIS — I10 Essential (primary) hypertension: Secondary | ICD-10-CM

## 2024-01-23 MED FILL — Metoprolol Succinate Tab ER 24HR 25 MG (Tartrate Equiv): ORAL | 90 days supply | Qty: 90 | Fill #0 | Status: AC

## 2024-01-23 MED FILL — Irbesartan Tab 75 MG: ORAL | 90 days supply | Qty: 90 | Fill #0 | Status: CN

## 2024-01-24 ENCOUNTER — Other Ambulatory Visit: Payer: Self-pay | Admitting: Emergency Medicine

## 2024-01-24 ENCOUNTER — Other Ambulatory Visit: Payer: Self-pay

## 2024-01-25 NOTE — Telephone Encounter (Signed)
Lvm for pt to give office a call back

## 2024-01-26 ENCOUNTER — Other Ambulatory Visit: Payer: Self-pay

## 2024-01-27 ENCOUNTER — Other Ambulatory Visit: Payer: Self-pay | Admitting: Nurse Practitioner

## 2024-01-27 ENCOUNTER — Telehealth: Payer: Self-pay

## 2024-01-27 ENCOUNTER — Telehealth: Payer: Self-pay | Admitting: Cardiology

## 2024-01-27 ENCOUNTER — Other Ambulatory Visit: Payer: Self-pay

## 2024-01-27 DIAGNOSIS — I10 Essential (primary) hypertension: Secondary | ICD-10-CM

## 2024-01-27 DIAGNOSIS — M48061 Spinal stenosis, lumbar region without neurogenic claudication: Secondary | ICD-10-CM

## 2024-01-27 MED ORDER — IRBESARTAN 75 MG PO TABS
75.0000 mg | ORAL_TABLET | Freq: Every day | ORAL | 1 refills | Status: DC
Start: 2024-01-27 — End: 2024-02-28
  Filled 2024-01-27: qty 90, 90d supply, fill #0

## 2024-01-27 MED FILL — Diclofenac Sodium Tab Delayed Release 75 MG: ORAL | 30 days supply | Qty: 60 | Fill #0 | Status: AC

## 2024-01-27 NOTE — Telephone Encounter (Signed)
Refill sent to local pharmacy.

## 2024-01-27 NOTE — Telephone Encounter (Signed)
 Copied from CRM [id number]. Topic: General - Call Back - No Documentation >> Jan 27, 2024  8:23 AM Alcus Dad wrote: Reason for CRM: Patient called back to speak with Maclin Guerrette. Please give patient a call back.

## 2024-01-27 NOTE — Telephone Encounter (Signed)
I did not call pt either.

## 2024-01-27 NOTE — Telephone Encounter (Signed)
 Not sure who called pt. But I personally didn't call pt. Sending to clinical pool for review

## 2024-01-27 NOTE — Telephone Encounter (Signed)
*  STAT* If patient is at the pharmacy, call can be transferred to refill team.   1. Which medications need to be refilled? (please list name of each medication and dose if known)   irbesartan (AVAPRO) 75 MG tablet   2. Would you like to learn more about the convenience, safety, & potential cost savings by using the [facility]?   3. Are you open to using the [facility] (Type [facility]. ).  4. Which pharmacy/location (including street and city if local pharmacy) is medication to be sent to?  [facility]   5. Do they need a 30 day or 90 day supply?   90 day  Patient stated she only has 5 tablets left.

## 2024-01-27 NOTE — Telephone Encounter (Signed)
 Additional:  Patient has appointment scheduled with Dr. Azucena Cecil on 02/28/24.

## 2024-01-31 ENCOUNTER — Other Ambulatory Visit: Payer: Self-pay

## 2024-02-28 ENCOUNTER — Encounter: Payer: Self-pay | Admitting: Cardiology

## 2024-02-28 ENCOUNTER — Ambulatory Visit: Attending: Cardiology | Admitting: Cardiology

## 2024-02-28 VITALS — BP 100/58 | HR 58 | Wt 138.0 lb

## 2024-02-28 DIAGNOSIS — I8393 Asymptomatic varicose veins of bilateral lower extremities: Secondary | ICD-10-CM | POA: Diagnosis not present

## 2024-02-28 DIAGNOSIS — I10 Essential (primary) hypertension: Secondary | ICD-10-CM

## 2024-02-28 NOTE — Patient Instructions (Signed)
 Medication Instructions:  STOP Irbesartan    *If you need a refill on your cardiac medications before your next appointment, please call your pharmacy*  Follow-Up: At [facility], you and your health needs are our priority.  As part of our continuing mission to provide you with exceptional heart care, our providers are all part of one team.  This team includes your primary Cardiologist (physician) and Advanced Practice Providers or APPs (Physician Assistants and Nurse Practitioners) who all work together to provide you with the care you need, when you need it.  Your next appointment:   12 month(s)  Provider:   You may see Constancia Delton, MD or one of the following Advanced Practice Providers on your designated Care Team:   Laneta Pintos, NP Gildardo Labrador, PA-C Varney Gentleman, PA-C Cadence Lyford, PA-C Ronald Cockayne, NP Morey Ar, NP    We recommend signing up for the patient portal called "MyChart".  Sign up information is provided on this After Visit Summary.  MyChart is used to connect with patients for Virtual Visits (Telemedicine).  Patients are able to view lab/test results, encounter notes, upcoming appointments, etc.  Non-urgent messages can be sent to your provider as well.   To learn more about what you can do with MyChart, go to [email or web address].

## 2024-02-28 NOTE — Progress Notes (Signed)
 Cardiology Office Note:    Date:  02/28/2024   ID:  Morgan Byrd, DOB [birth year], MRN [id number]  PCP:  Bluford Burkitt, NP  Cardiologist:  Constancia Delton, MD  Electrophysiologist:  None   Referring MD: Bluford Burkitt, NP   No chief complaint on file.   History of Present Illness:    Morgan Byrd is a 88 y.o. female with a hx of hypertension, varicose veins who presents for follow-up.   Overall doing well, denies chest pain or shortness of breath.  Leg edema adequately controlled with HCTZ.  Blood pressure is usually well-controlled.  Denies dizziness, presyncope or syncope.   Prior notes echocardiogram was ordered which patient performed at [facility] on 10/11/2019.  Report available in care everywhere, but briefly normal left ventricular systolic function, normal right ventricular systolic function,  EF was 55%, trivial MR, mild TR.   Past Medical History:  Diagnosis Date   Anxiety    Arthritis    C2 cervical fracture (HCC) 11/14/2019   Cancer (HCC)    Complication of anesthesia    Heart murmur    History of closed fracture of nasal bones 10/11/2019   Hypertension    Lung nodule 04/05/2019   CT scan completed May 2020 recommends repeating in 12 months   Nasal fracture 11/14/2019   PONV (postoperative nausea and vomiting)     Past Surgical History:  Procedure Laterality Date   ABDOMINAL HYSTERECTOMY     BLADDER SUSPENSION     BREAST BIOPSY Right 1978   neg   cancer cell removed from hand  04/2023   COLONOSCOPY WITH PROPOFOL  N/A 12/26/2015   Procedure: COLONOSCOPY WITH PROPOFOL ;  Surgeon: Cassie Click, MD;  Location: [facility];  Service: Endoscopy;  Laterality: N/A;   ESOPHAGOGASTRODUODENOSCOPY N/A 09/27/2022   Procedure: ESOPHAGOGASTRODUODENOSCOPY (EGD);  Surgeon: Quintin Buckle, DO;  Location: [facility] ENDOSCOPY;  Service: Gastroenterology;  Laterality: N/A;   LUMBAR LAMINECTOMY/DECOMPRESSION MICRODISCECTOMY Left 07/20/2021   Procedure: OPEN  L4/5 LEFT HEMILAMINECTOMY & DISCECTOMY;  Surgeon: Berta Brittle, MD;  Location: [facility] ORS;  Service: Neurosurgery;  Laterality: Left;   NOSE SURGERY     REPLACEMENT TOTAL KNEE BILATERAL     SHOULDER SURGERY     VAGINAL DELIVERY     2    Current Medications: Current Meds  Medication Sig   B Complex-C (SUPER B COMPLEX PO) Take 1 tablet by mouth daily.   CALCIUM-MAGNESIUM-ZINC PO Take 1 tablet by mouth daily.   diclofenac  (VOLTAREN ) 75 MG EC tablet TAKE 1 TABLET BY MOUTH TWICE DAILY AS NEEDED FOR MODERATE PAIN   gabapentin  (NEURONTIN ) 300 MG capsule Take 1 capsule (300 mg total) by mouth 3 (three) times daily.   hydrochlorothiazide  (MICROZIDE ) 12.5 MG capsule Take 1 capsule (12.5 mg total) by mouth daily.   methimazole  (TAPAZOLE ) 5 MG tablet Take 0.5 tablets (2.5 mg total) by mouth once daily.   metoprolol  succinate (TOPROL -XL) 25 MG 24 hr tablet Take 1 tablet (25 mg total) by mouth daily.   Multiple Vitamin (MULTIVITAMIN) capsule Take 1 capsule by mouth daily.   omeprazole  (PRILOSEC) 20 MG capsule Take 1 capsule (20 mg total) by mouth daily.   sertraline  (ZOLOFT ) 50 MG tablet Take 1 tablet (50 mg total) by mouth daily.   [DISCONTINUED] irbesartan  (AVAPRO ) 75 MG tablet Take 1 tablet (75 mg total) by mouth daily.     Allergies:   Codeine, Fosamax [alendronate sodium], Lexapro [escitalopram oxalate], and Spiriva  respimat [tiotropium bromide monohydrate ]   Social History  Socioeconomic History   Marital status: Widowed    Spouse name: Not on file   Number of children: Not on file   Years of education: Not on file   Highest education level: Not on file  Occupational History   Not on file  Tobacco Use   Smoking status: Former    Current packs/day: 0.00    Average packs/day: 1 pack/day for 4.0 years (4.0 ttl pk-yrs)    Types: Cigarettes    Start date: 38    Quit date: 40    Years since quitting: 51.3   Smokeless tobacco: Never  Vaping Use   Vaping status: Never Used   Substance and Sexual Activity   Alcohol use: No   Drug use: No   Sexual activity: Never  Other Topics Concern   Not on file  Social History Narrative   Lives in [place]. Son and daughter live nearby.Work - retired Psychiatric nurse - healthy, Art therapist - housework   Social Drivers of Corporate investment banker Strain: Low Risk  (05/13/2023)   Overall Financial Resource Strain (CARDIA)    Difficulty of Paying Living Expenses: Not hard at all  Food Insecurity: No Food Insecurity (05/13/2023)   Hunger Vital Sign    Worried About Running Out of Food in the Last Year: Never true    Ran Out of Food in the Last Year: Never true  Transportation Needs: No Transportation Needs (05/13/2023)   PRAPARE - Administrator, Civil Service (Medical): No    Lack of Transportation (Non-Medical): No  Physical Activity: Inactive (05/13/2023)   Exercise Vital Sign    Days of Exercise per Week: 0 days    Minutes of Exercise per Session: 0 min  Stress: No Stress Concern Present (05/13/2023)   [employer] of Occupational Health - Occupational Stress Questionnaire    Feeling of Stress : Not at all  Social Connections: Moderately Integrated (05/13/2023)   Social Connection and Isolation Panel [NHANES]    Frequency of Communication with Friends and Family: More than three times a week    Frequency of Social Gatherings with Friends and Family: More than three times a week    Attends Religious Services: More than 4 times per year    Active Member of [employer] or Organizations: Yes    Attends Banker Meetings: More than 4 times per year    Marital Status: Widowed     Family History: The patient's family history includes Arthritis in her mother; Breast cancer (age of onset: 32) in her cousin; Breast cancer (age of onset: 62) in her sister; Cancer in her father and sister; Dementia in her mother; Diabetes in her father and sister; Heart disease in her father and sister; Hypertension in her  father; Kidney disease in her son; Pneumonia in her son; Vasculitis in her son.  ROS:   Please see the history of present illness.     All other systems reviewed and are negative.  EKGs/Labs/Other Studies Reviewed:          Recent Labs: 01/17/2024: ALT 18; BUN 15; Creatinine, Ser 0.82; Hemoglobin 13.9; Platelets 273.0; Potassium 4.0; Sodium 139; TSH 1.35  Recent Lipid Panel    Component Value Date/Time   CHOL 228 (H) 01/17/2024 0925   TRIG 81.0 01/17/2024 0925   HDL 74.50 01/17/2024 0925   CHOLHDL 3 01/17/2024 0925   VLDL 16.2 01/17/2024 0925   LDLCALC 137 (H) 01/17/2024 0925   LDLDIRECT 131.8 04/30/2013 0915    Physical  Exam:    VS:  BP (!) 100/58   Pulse (!) 58   Wt 138 lb (62.6 kg)   SpO2 95%   BMI 26.95 kg/m     Wt Readings from Last 3 Encounters:  02/28/24 138 lb (62.6 kg)  01/17/24 136 lb 12.8 oz (62.1 kg)  12/30/23 141 lb 9.6 oz (64.2 kg)     GEN:  Well nourished, well developed in no acute distress HEENT: Normal NECK: No JVD; No carotid bruits CARDIAC: RRR, 1-2/6 systolic murmur at left sternal border, rubs, gallops RESPIRATORY:  Clear to auscultation without rales, wheezing or rhonchi  ABDOMEN: Soft, non-tender, non-distended MUSCULOSKELETAL:  No edema; varicose veins noted SKIN: Warm and dry NEUROLOGIC:  Alert and oriented x 3 PSYCHIATRIC:  Normal affect   ASSESSMENT:    1. Primary hypertension   2. Varicose veins of both lower extremities, unspecified whether complicated    PLAN:    In order of problems listed above:  Hypertension, BP low, stop irbesartan .  Continue HCTZ 12.5 mg daily, Toprol -XL 25 mg daily. continued due to history of leg edema, anxiety respectively. Varicose veins, no edema, continue HCTZ as above.  Follow-up in 1 year or as needed.   Medication Adjustments/Labs and Tests Ordered: Current medicines are reviewed at length with the patient today.  Concerns regarding medicines are outlined above.  Orders Placed This  Encounter  Procedures   EKG 12-Lead    No orders of the defined types were placed in this encounter.    Patient Instructions  Medication Instructions:  STOP Irbesartan    *If you need a refill on your cardiac medications before your next appointment, please call your pharmacy*  Follow-Up: At [facility], you and your health needs are our priority.  As part of our continuing mission to provide you with exceptional heart care, our providers are all part of one team.  This team includes your primary Cardiologist (physician) and Advanced Practice Providers or APPs (Physician Assistants and Nurse Practitioners) who all work together to provide you with the care you need, when you need it.  Your next appointment:   12 month(s)  Provider:   You may see Constancia Delton, MD or one of the following Advanced Practice Providers on your designated Care Team:   Laneta Pintos, NP Gildardo Labrador, PA-C Varney Gentleman, PA-C Cadence [place], PA-C Ronald Cockayne, NP Morey Ar, NP    We recommend signing up for the patient portal called "MyChart".  Sign up information is provided on this After Visit Summary.  MyChart is used to connect with patients for Virtual Visits (Telemedicine).  Patients are able to view lab/test results, encounter notes, upcoming appointments, etc.  Non-urgent messages can be sent to your provider as well.   To learn more about what you can do with MyChart, go to [email or web address].         Signed, Constancia Delton, MD  02/28/2024 10:05 AM    [facility]

## 2024-03-05 ENCOUNTER — Other Ambulatory Visit: Payer: Self-pay

## 2024-03-09 ENCOUNTER — Other Ambulatory Visit: Payer: Self-pay

## 2024-03-13 ENCOUNTER — Other Ambulatory Visit: Payer: Self-pay

## 2024-03-20 ENCOUNTER — Other Ambulatory Visit: Payer: Self-pay

## 2024-03-20 ENCOUNTER — Other Ambulatory Visit: Payer: Self-pay | Admitting: Nurse Practitioner

## 2024-03-20 ENCOUNTER — Other Ambulatory Visit: Payer: Self-pay | Admitting: Family Medicine

## 2024-03-20 DIAGNOSIS — G629 Polyneuropathy, unspecified: Secondary | ICD-10-CM

## 2024-03-20 MED FILL — Gabapentin Cap 300 MG: ORAL | 30 days supply | Qty: 90 | Fill #0 | Status: CN

## 2024-03-29 ENCOUNTER — Telehealth: Payer: Self-pay

## 2024-03-29 NOTE — Telephone Encounter (Signed)
 Copied from CRM [id number]. Topic: Clinical - Medical Advice >> Mar 29, 2024  2:15 PM Dewanda Foots wrote: Reason for CRM: Pt states that last month she had a stye on her left eye and it was there for a month. There is another stye this time on the right eye and it is causing her eyes to be irritated and red.  States she may have an infection and nose is still runny at times. Coughs up green mucus in the morning. Clears her throat.  Would like to know what she can do to get this to go away.  Please call [phone] and advise.  Thank you so much

## 2024-03-30 ENCOUNTER — Ambulatory Visit (INDEPENDENT_AMBULATORY_CARE_PROVIDER_SITE_OTHER): Admitting: Family Medicine

## 2024-03-30 ENCOUNTER — Encounter: Payer: Self-pay | Admitting: Family Medicine

## 2024-03-30 ENCOUNTER — Other Ambulatory Visit: Payer: Self-pay

## 2024-03-30 VITALS — BP 140/70 | HR 58 | Temp 98.2°F | Ht 60.0 in | Wt 133.5 lb

## 2024-03-30 DIAGNOSIS — H0100A Unspecified blepharitis right eye, upper and lower eyelids: Secondary | ICD-10-CM | POA: Diagnosis not present

## 2024-03-30 DIAGNOSIS — H00024 Hordeolum internum left upper eyelid: Secondary | ICD-10-CM | POA: Diagnosis not present

## 2024-03-30 DIAGNOSIS — R051 Acute cough: Secondary | ICD-10-CM | POA: Diagnosis not present

## 2024-03-30 DIAGNOSIS — H0100B Unspecified blepharitis left eye, upper and lower eyelids: Secondary | ICD-10-CM

## 2024-03-30 MED ORDER — AZITHROMYCIN 250 MG PO TABS
ORAL_TABLET | ORAL | 0 refills | Status: AC
Start: 2024-03-30 — End: 2024-04-04
  Filled 2024-03-30: qty 6, 5d supply, fill #0

## 2024-03-30 MED ORDER — ERYTHROMYCIN 5 MG/GM OP OINT
1.0000 | TOPICAL_OINTMENT | Freq: Every day | OPHTHALMIC | 0 refills | Status: AC
  Filled 2024-03-30: qty 3.5, 3d supply, fill #0

## 2024-03-30 NOTE — Progress Notes (Signed)
 Patient ID: Morgan Byrd, female    DOB: Nov 20, 1935, 88 y.o.   MRN: [id number]  This visit was conducted in person.  BP (!) 140/70   Pulse (!) 58   Temp 98.2 F (36.8 C) (Temporal)   Ht 5' (1.524 m)   Wt 133 lb 8 oz (60.6 kg)   SpO2 98%   BMI 26.07 kg/m    CC:  Chief Complaint  Patient presents with   Stye    States been having Styes alot   Cough   Nasal Congestion         Subjective:   HPI: Morgan Byrd is a 88 y.o. female  patient of Morgan Burkitt, NP presenting on 03/30/2024 for Stye (States been having Styes alot), Cough, and Nasal Congestion (/)   Date of onset: 3-4 weeks Initial symptoms included   nasal congestion, coughing in AMs, mucus in throat.  Productive green mucus. No fever, no SOB. No ear pain, no ST  Intermittent soreness in face bilaterally.   Had a stye in right eye in last month, now in left upper lid   Sick contacts: none COVID testing:   none     She has tried to treat with   warm compresses, has tried massaging      She has history of mild  bronchiectasis followed by pulmonary... Per 2022 note pulmonary function test normal lung function. Non-smoker.  No recent antibiotics     Relevant past medical, surgical, family and social history reviewed and updated as indicated. Interim medical history since our last visit reviewed. Allergies and medications reviewed and updated. Outpatient Medications Byrd to Visit  Medication Sig Dispense Refill   B Complex-C (SUPER B COMPLEX PO) Take 1 tablet by mouth daily.     CALCIUM-MAGNESIUM-ZINC PO Take 1 tablet by mouth daily.     diclofenac  (VOLTAREN ) 75 MG EC tablet TAKE 1 TABLET BY MOUTH TWICE DAILY AS NEEDED FOR MODERATE PAIN 60 tablet 0   gabapentin  (NEURONTIN ) 300 MG capsule Take 1 capsule (300 mg total) by mouth 3 (three) times daily. 90 capsule 5   hydrochlorothiazide  (MICROZIDE ) 12.5 MG capsule Take 1 capsule (12.5 mg total) by mouth daily. 90 capsule 1   methimazole  (TAPAZOLE ) 5 MG  tablet Take 0.5 tablets (2.5 mg total) by mouth once daily. 45 tablet 4   metoprolol  succinate (TOPROL -XL) 25 MG 24 hr tablet Take 1 tablet (25 mg total) by mouth daily. 90 tablet 3   Multiple Vitamin (MULTIVITAMIN) capsule Take 1 capsule by mouth daily.     omeprazole  (PRILOSEC) 20 MG capsule Take 1 capsule (20 mg total) by mouth daily. 90 capsule 1   sertraline  (ZOLOFT ) 50 MG tablet Take 1 tablet (50 mg total) by mouth daily. 90 tablet 1   No facility-administered medications Byrd to visit.     Per HPI unless specifically indicated in ROS section below Review of Systems  Constitutional:  Negative for fatigue and fever.  HENT:  Negative for congestion.   Eyes:  Negative for pain.  Respiratory:  Negative for cough and shortness of breath.   Cardiovascular:  Negative for chest pain, palpitations and leg swelling.  Gastrointestinal:  Negative for abdominal pain.  Genitourinary:  Negative for dysuria and vaginal bleeding.  Musculoskeletal:  Negative for back pain.  Neurological:  Negative for syncope, light-headedness and headaches.  Psychiatric/Behavioral:  Negative for dysphoric mood.    Objective:  BP (!) 140/70   Pulse (!) 58   Temp 98.2 F (  36.8 C) (Temporal)   Ht 5' (1.524 m)   Wt 133 lb 8 oz (60.6 kg)   SpO2 98%   BMI 26.07 kg/m   Wt Readings from Last 3 Encounters:  03/30/24 133 lb 8 oz (60.6 kg)  02/28/24 138 lb (62.6 kg)  01/17/24 136 lb 12.8 oz (62.1 kg)      Physical Exam Constitutional:      General: She is not in acute distress.    Appearance: She is well-developed. She is not ill-appearing or toxic-appearing.  HENT:     Head: Normocephalic.     Right Ear: Hearing, tympanic membrane, ear canal and external ear normal. Tympanic membrane is not erythematous, retracted or bulging.     Left Ear: Hearing, tympanic membrane, ear canal and external ear normal. Tympanic membrane is not erythematous, retracted or bulging.     Nose: Mucosal edema and rhinorrhea  present.     Right Sinus: No maxillary sinus tenderness or frontal sinus tenderness.     Left Sinus: No maxillary sinus tenderness or frontal sinus tenderness.     Mouth/Throat:     Pharynx: Uvula midline.  Eyes:     General: Lids are normal. Lids are everted, no foreign bodies appreciated.        Left eye: Hordeolum present.No foreign body or discharge.     Conjunctiva/sclera: Conjunctivae normal.     Pupils: Pupils are equal, round, and reactive to light.      Comments: Erythema of eyelid edge, dry flaky skin at eyelid margin  Neck:     Thyroid : No thyroid  mass or thyromegaly.     Vascular: No carotid bruit.     Trachea: Trachea normal.  Cardiovascular:     Rate and Rhythm: Normal rate and regular rhythm.     Pulses: Normal pulses.     Heart sounds: Normal heart sounds, S1 normal and S2 normal. No murmur heard.    No friction rub. No gallop.  Pulmonary:     Effort: Pulmonary effort is normal. No tachypnea or respiratory distress.     Breath sounds: Normal breath sounds. No decreased breath sounds, wheezing, rhonchi or rales.  Musculoskeletal:     Cervical back: Normal range of motion and neck supple.  Skin:    General: Skin is warm and dry.     Findings: No rash.  Neurological:     Mental Status: She is alert.  Psychiatric:        Mood and Affect: Mood is not anxious or depressed.        Speech: Speech normal.        Behavior: Behavior normal. Behavior is cooperative.        Judgment: Judgment normal.       Results for orders placed or performed in visit on 01/17/24  CBC with Differential/Platelet   Collection Time: 01/17/24  9:25 AM  Result Value Ref Range   WBC 7.7 4.0 - 10.5 K/uL   RBC 4.36 3.87 - 5.11 Mil/uL   Hemoglobin 13.9 12.0 - 15.0 g/dL   HCT 66.4 40.3 - 47.4 %   MCV 96.8 78.0 - 100.0 fl   MCHC 32.9 30.0 - 36.0 g/dL   RDW 25.9 56.3 - 87.5 %   Platelets 273.0 150.0 - 400.0 K/uL   Neutrophils Relative % 65.1 43.0 - 77.0 %   Lymphocytes Relative 21.6 12.0  - 46.0 %   Monocytes Relative 7.7 3.0 - 12.0 %   Eosinophils Relative 4.5 0.0 - 5.0 %  Basophils Relative 1.1 0.0 - 3.0 %   Neutro Abs 5.0 1.4 - 7.7 K/uL   Lymphs Abs 1.7 0.7 - 4.0 K/uL   Monocytes Absolute 0.6 0.1 - 1.0 K/uL   Eosinophils Absolute 0.3 0.0 - 0.7 K/uL   Basophils Absolute 0.1 0.0 - 0.1 K/uL  Comprehensive metabolic panel   Collection Time: 01/17/24  9:25 AM  Result Value Ref Range   Sodium 139 135 - 145 mEq/L   Potassium 4.0 3.5 - 5.1 mEq/L   Chloride 98 96 - 112 mEq/L   CO2 32 19 - 32 mEq/L   Glucose, Bld 90 70 - 99 mg/dL   BUN 15 6 - 23 mg/dL   Creatinine, Ser 1.61 0.40 - 1.20 mg/dL   Total Bilirubin 0.8 0.2 - 1.2 mg/dL   Alkaline Phosphatase 77 39 - 117 U/L   AST 25 0 - 37 U/L   ALT 18 0 - 35 U/L   Total Protein 7.4 6.0 - 8.3 g/dL   Albumin 4.4 3.5 - 5.2 g/dL   GFR 09.60 >45.40 mL/min   Calcium 9.9 8.4 - 10.5 mg/dL  Lipid panel   Collection Time: 01/17/24  9:25 AM  Result Value Ref Range   Cholesterol 228 (H) 0 - 200 mg/dL   Triglycerides 98.1 0.0 - 149.0 mg/dL   HDL 19.14 >78.29 mg/dL   VLDL 56.2 0.0 - 13.0 mg/dL   LDL Cholesterol 865 (H) 0 - 99 mg/dL   Total CHOL/HDL Ratio 3    NonHDL 153.27   TSH   Collection Time: 01/17/24  9:25 AM  Result Value Ref Range   TSH 1.35 0.35 - 5.50 uIU/mL  Hemoglobin A1c   Collection Time: 01/17/24  9:25 AM  Result Value Ref Range   Hgb A1c MFr Bld 5.8 4.6 - 6.5 %    Assessment and Plan  Acute cough Assessment & Plan: Acute, given change in mucus concerning for bacterial superinfection.  Patient with history of mild bronchiectasis.  Will treat with azithromycin 5-day course.  Can use Mucinex as needed for mucus.  Return and ER precautions provided.   Hordeolum internum of left upper eyelid Assessment & Plan: Acute, likely multiple styes over the last several months.  Most likely frequency of hordeolum secondary to evidence of blepharitis.  We discussed washing her eyelid edges and potentially combing out  debris.  For current stye recommend continued warm compresses several times a day and begin treatment with erythromycin ointment nightly.   Blepharitis of upper and lower eyelids of both eyes, unspecified type  Other orders -     Azithromycin; 2 tablets (500 mg total) daily for 1 day, THEN 1 tablet (250 mg total) daily for 4 days.  Dispense: 6 tablet; Refill: 0 -     Erythromycin; Place 1 Application into the left eye at bedtime.  Dispense: 3.5 g; Refill: 0    No follow-ups on file.   Herby Lolling, MD

## 2024-03-30 NOTE — Assessment & Plan Note (Addendum)
 Acute, given change in mucus concerning for bacterial superinfection.  Patient with history of mild bronchiectasis.  Will treat with azithromycin 5-day course.  Can use Mucinex as needed for mucus.  Return and ER precautions provided.

## 2024-03-30 NOTE — Assessment & Plan Note (Signed)
 Acute, likely multiple styes over the last several months.  Most likely frequency of hordeolum secondary to evidence of blepharitis.  We discussed washing her eyelid edges and potentially combing out debris.  For current stye recommend continued warm compresses several times a day and begin treatment with erythromycin ointment nightly.

## 2024-04-17 DIAGNOSIS — D2261 Melanocytic nevi of right upper limb, including shoulder: Secondary | ICD-10-CM | POA: Diagnosis not present

## 2024-04-17 DIAGNOSIS — D225 Melanocytic nevi of trunk: Secondary | ICD-10-CM | POA: Diagnosis not present

## 2024-04-17 DIAGNOSIS — L570 Actinic keratosis: Secondary | ICD-10-CM | POA: Diagnosis not present

## 2024-04-17 DIAGNOSIS — D2271 Melanocytic nevi of right lower limb, including hip: Secondary | ICD-10-CM | POA: Diagnosis not present

## 2024-04-17 DIAGNOSIS — D485 Neoplasm of uncertain behavior of skin: Secondary | ICD-10-CM | POA: Diagnosis not present

## 2024-04-17 DIAGNOSIS — D045 Carcinoma in situ of skin of trunk: Secondary | ICD-10-CM | POA: Diagnosis not present

## 2024-04-17 DIAGNOSIS — Z85828 Personal history of other malignant neoplasm of skin: Secondary | ICD-10-CM | POA: Diagnosis not present

## 2024-04-17 DIAGNOSIS — C44719 Basal cell carcinoma of skin of left lower limb, including hip: Secondary | ICD-10-CM | POA: Diagnosis not present

## 2024-04-17 DIAGNOSIS — D2262 Melanocytic nevi of left upper limb, including shoulder: Secondary | ICD-10-CM | POA: Diagnosis not present

## 2024-04-18 ENCOUNTER — Other Ambulatory Visit: Payer: Self-pay

## 2024-04-18 MED ORDER — AMOXICILLIN 500 MG PO CAPS
2000.0000 mg | ORAL_CAPSULE | ORAL | 2 refills | Status: AC
  Filled 2024-04-18: qty 8, 2d supply, fill #0
  Filled 2024-05-08: qty 8, 2d supply, fill #1
  Filled 2024-06-01: qty 8, 2d supply, fill #2

## 2024-04-19 ENCOUNTER — Other Ambulatory Visit: Payer: Self-pay

## 2024-04-19 ENCOUNTER — Other Ambulatory Visit: Payer: Self-pay | Admitting: Nurse Practitioner

## 2024-04-19 DIAGNOSIS — I10 Essential (primary) hypertension: Secondary | ICD-10-CM

## 2024-04-19 MED FILL — Gabapentin Cap 300 MG: ORAL | 30 days supply | Qty: 90 | Fill #0 | Status: AC

## 2024-04-19 MED FILL — Hydrochlorothiazide Cap 12.5 MG: ORAL | 90 days supply | Qty: 90 | Fill #0 | Status: AC

## 2024-04-25 ENCOUNTER — Other Ambulatory Visit: Payer: Self-pay

## 2024-04-27 ENCOUNTER — Other Ambulatory Visit: Payer: Self-pay

## 2024-05-02 ENCOUNTER — Other Ambulatory Visit: Payer: Self-pay

## 2024-05-02 ENCOUNTER — Other Ambulatory Visit: Payer: Self-pay | Admitting: Cardiology

## 2024-05-02 DIAGNOSIS — I10 Essential (primary) hypertension: Secondary | ICD-10-CM

## 2024-05-03 ENCOUNTER — Other Ambulatory Visit: Payer: Self-pay | Admitting: Cardiology

## 2024-05-03 ENCOUNTER — Other Ambulatory Visit: Payer: Self-pay

## 2024-05-03 DIAGNOSIS — I10 Essential (primary) hypertension: Secondary | ICD-10-CM

## 2024-05-04 ENCOUNTER — Other Ambulatory Visit: Payer: Self-pay

## 2024-05-07 ENCOUNTER — Other Ambulatory Visit: Payer: Self-pay

## 2024-05-08 ENCOUNTER — Other Ambulatory Visit: Payer: Self-pay

## 2024-05-08 MED FILL — Metoprolol Succinate Tab ER 24HR 25 MG (Tartrate Equiv): ORAL | 90 days supply | Qty: 90 | Fill #1 | Status: AC

## 2024-05-16 ENCOUNTER — Ambulatory Visit: Payer: Medicare Other | Admitting: *Deleted

## 2024-05-16 VITALS — Ht 60.0 in | Wt 133.0 lb

## 2024-05-16 DIAGNOSIS — Z0000 Encounter for general adult medical examination without abnormal findings: Secondary | ICD-10-CM | POA: Diagnosis not present

## 2024-05-16 NOTE — Patient Instructions (Signed)
 Morgan Byrd , Thank you for taking time out of your busy schedule to complete your Annual Wellness Visit with me. I enjoyed our conversation and look forward to speaking with you again next year. I, as well as your care team,  appreciate your ongoing commitment to your health goals. Please review the following plan we discussed and let me know if I can assist you in the future. Your Game plan/ To Do List    Referrals: If you haven't heard from the office you've been referred to, please reach out to them at the phone provided.  Remember to update your flu and covid vaccines annually Follow up Visits: Next Medicare AWV with our clinical staff: 05/20/25 @ 1:00   Have you seen your provider in the last 6 months (3 months if uncontrolled diabetes)? Yes Next Office Visit with your provider: 07/19/24  Clinician Recommendations:  Aim for 30 minutes of exercise or brisk walking, 6-8 glasses of water, and 5 servings of fruits and vegetables each day.       This is a list of the screening recommended for you and due dates:  Health Maintenance  Topic Date Due   COVID-19 Vaccine (6 - 2024-25 season) 02/01/2024   Flu Shot  06/08/2024   DTaP/Tdap/Td vaccine (3 - Td or Tdap) 10/03/2029   Pneumococcal Vaccine for age over 50  Completed   DEXA scan (bone density measurement)  Completed   Zoster (Shingles) Vaccine  Completed   Hepatitis B Vaccine  Aged Out   HPV Vaccine  Aged Out   Meningitis B Vaccine  Aged Out    Advanced directives: (Copy Requested) Please bring a copy of your health care power of attorney and living will to the office to be added to your chart at your convenience. You can mail to [facility] [address] [place], [place] [address] or email to [email or web address] Advance Care Planning is important because it:  [x]  Makes sure you receive the medical care that is consistent with your values, goals, and preferences  [x]  It provides guidance to your family and  loved ones and reduces their decisional burden about whether or not they are making the right decisions based on your wishes.

## 2024-05-16 NOTE — Progress Notes (Signed)
 Subjective:   Morgan Byrd is a 88 y.o. who presents for a Medicare Wellness preventive visit.  As a reminder, Annual Wellness Visits don't include a physical exam, and some assessments may be limited, especially if this visit is performed virtually. We may recommend an in-person follow-up visit with your provider if needed.  Visit Complete: Virtual I connected with  [name] on 05/16/24 by a audio enabled telemedicine application and verified that I am speaking with the correct person using two identifiers.  Patient Location: Home  Provider Location: Home Office  I discussed the limitations of evaluation and management by telemedicine. The patient expressed understanding and agreed to proceed.  Vital Signs: Because this visit was a virtual/telehealth visit, some criteria may be missing or patient reported. Any vitals not documented were not able to be obtained and vitals that have been documented are patient reported.  VideoDeclined- This patient declined Librarian, academic. Therefore the visit was completed with audio only.  Persons Participating in Visit: Patient.  AWV Questionnaire: No: Patient Medicare AWV questionnaire was not completed prior to this visit.  Cardiac Risk Factors include: advanced age (>72men, >38 women);hypertension     Objective:    Today's Vitals   05/16/24 0811  Weight: 133 lb (60.3 kg)  Height: 5' (1.524 m)   Body mass index is 25.97 kg/m.     05/16/2024    8:27 AM 06/22/2023    5:04 PM 05/13/2023   11:20 AM 09/27/2022   10:18 AM 04/25/2022    9:45 AM 04/12/2022    9:55 AM 07/20/2021    9:01 AM  Advanced Directives  Does Patient Have a Medical Advance Directive? Yes Yes Yes Yes Yes Yes Yes  Type of Estate agent of [place];Living will Healthcare Power of [place];Living will Healthcare Power of [place];Living will  Healthcare Power of [place];Living will Healthcare Power of [place];Living  will Healthcare Power of [place];Living will  Does patient want to make changes to medical advance directive?     No - Patient declined No - Patient declined No - Patient declined  Copy of Healthcare Power of Attorney in Chart? No - copy requested  No - copy requested   No - copy requested No - copy requested    Current Medications (verified) Outpatient Encounter Medications as of 05/16/2024  Medication Sig   amoxicillin  (AMOXIL ) 500 MG capsule Take 4 capsules (2,000 mg total) by mouth one hour before dental appointments.   aspirin EC 81 MG tablet Take 81 mg by mouth daily. Swallow whole.   B Complex-C (SUPER B COMPLEX PO) Take 1 tablet by mouth daily.   CALCIUM-MAGNESIUM-ZINC PO Take 1 tablet by mouth daily.   diclofenac  (VOLTAREN ) 75 MG EC tablet TAKE 1 TABLET BY MOUTH TWICE DAILY AS NEEDED FOR MODERATE PAIN   erythromycin  ophthalmic ointment Place 1 Application into the left eye at bedtime. (Patient taking differently: Place 1 Application into the left eye at bedtime as needed.)   gabapentin  (NEURONTIN ) 300 MG capsule Take 1 capsule (300 mg total) by mouth 3 (three) times daily. (Patient taking differently: Take 300 mg by mouth 3 (three) times daily. Takes two times a day)   hydrochlorothiazide  (MICROZIDE ) 12.5 MG capsule Take 1 capsule (12.5 mg total) by mouth daily.   methimazole  (TAPAZOLE ) 5 MG tablet Take 0.5 tablets (2.5 mg total) by mouth once daily.   metoprolol  succinate (TOPROL -XL) 25 MG 24 hr tablet Take 1 tablet (25 mg total) by mouth daily.  Multiple Vitamin (MULTIVITAMIN) capsule Take 1 capsule by mouth daily.   omeprazole  (PRILOSEC) 20 MG capsule Take 1 capsule (20 mg total) by mouth daily.   sertraline  (ZOLOFT ) 50 MG tablet Take 1 tablet (50 mg total) by mouth daily.   No facility-administered encounter medications on file as of 05/16/2024.    Allergies (verified) Codeine, Fosamax [alendronate sodium], Lexapro [escitalopram oxalate], and Spiriva  respimat [tiotropium bromide  monohydrate]   History: Past Medical History:  Diagnosis Date   Anxiety    Arthritis    C2 cervical fracture (HCC) 11/14/2019   Cancer (HCC)    Complication of anesthesia    Heart murmur    History of closed fracture of nasal bones 10/11/2019   Hypertension    Lung nodule 04/05/2019   CT scan completed May 2020 recommends repeating in 12 months   Nasal fracture 11/14/2019   PONV (postoperative nausea and vomiting)    Past Surgical History:  Procedure Laterality Date   ABDOMINAL HYSTERECTOMY     BLADDER SUSPENSION     BREAST BIOPSY Right 1978   neg   cancer cell removed from hand  04/2023   COLONOSCOPY WITH PROPOFOL  N/A 12/26/2015   Procedure: COLONOSCOPY WITH PROPOFOL ;  Surgeon: Lamar ONEIDA Holmes, MD;  Location: [facility] ENDOSCOPY;  Service: Endoscopy;  Laterality: N/A;   ESOPHAGOGASTRODUODENOSCOPY N/A 09/27/2022   Procedure: ESOPHAGOGASTRODUODENOSCOPY (EGD);  Surgeon: Onita Elspeth Sharper, DO;  Location: [facility] ENDOSCOPY;  Service: Gastroenterology;  Laterality: N/A;   LUMBAR LAMINECTOMY/DECOMPRESSION MICRODISCECTOMY Left 07/20/2021   Procedure: OPEN L4/5 LEFT HEMILAMINECTOMY & DISCECTOMY;  Surgeon: Bluford Elspeth, MD;  Location: [facility] ORS;  Service: Neurosurgery;  Laterality: Left;   NOSE SURGERY     REPLACEMENT TOTAL KNEE BILATERAL     SHOULDER SURGERY     VAGINAL DELIVERY     2   Family History  Problem Relation Age of Onset   Dementia Mother    Arthritis Mother    Heart disease Father    Hypertension Father    Cancer Father        prostate   Diabetes Father    Diabetes Sister    Cancer Sister        breast   Breast cancer Sister 77   Heart disease Sister    Vasculitis Son    Kidney disease Son    Pneumonia Son    Breast cancer Cousin 71   Social History   Socioeconomic History   Marital status: Widowed    Spouse name: Not on file   Number of children: Not on file   Years of education: Not on file   Highest education level: Not on file  Occupational History    Not on file  Tobacco Use   Smoking status: Former    Current packs/day: 0.00    Average packs/day: 1 pack/day for 4.0 years (4.0 ttl pk-yrs)    Types: Cigarettes    Start date: 36    Quit date: 89    Years since quitting: 51.5   Smokeless tobacco: Never  Vaping Use   Vaping status: Never Used  Substance and Sexual Activity   Alcohol use: No   Drug use: No   Sexual activity: Never  Other Topics Concern   Not on file  Social History Narrative   Lives in [place]. Son and daughter live nearby.Work - retired Psychiatric nurse - healthy, Art therapist - housework   Social Drivers of Corporate investment banker Strain: Low Risk  (05/16/2024)   Overall Programmer, applications (  CARDIA)    Difficulty of Paying Living Expenses: Not hard at all  Food Insecurity: No Food Insecurity (05/16/2024)   Hunger Vital Sign    Worried About Running Out of Food in the Last Year: Never true    Ran Out of Food in the Last Year: Never true  Transportation Needs: No Transportation Needs (05/16/2024)   PRAPARE - Administrator, Civil Service (Medical): No    Lack of Transportation (Non-Medical): No  Physical Activity: Inactive (05/16/2024)   Exercise Vital Sign    Days of Exercise per Week: 0 days    Minutes of Exercise per Session: 0 min  Stress: No Stress Concern Present (05/16/2024)   [employer] of Occupational Health - Occupational Stress Questionnaire    Feeling of Stress: Not at all  Social Connections: Moderately Integrated (05/16/2024)   Social Connection and Isolation Panel    Frequency of Communication with Friends and Family: More than three times a week    Frequency of Social Gatherings with Friends and Family: More than three times a week    Attends Religious Services: More than 4 times per year    Active Member of [employer] or Organizations: Yes    Attends Banker Meetings: More than 4 times per year    Marital Status: Widowed    Tobacco Counseling Counseling  given: Not Answered    Clinical Intake:  Pre-visit preparation completed: Yes  Pain : No/denies pain     BMI - recorded: 25.97 Nutritional Status: BMI 25 -29 Overweight Nutritional Risks: None Diabetes: No  Lab Results  Component Value Date   HGBA1C 5.8 01/17/2024   HGBA1C 5.8 01/31/2023   HGBA1C 5.9 07/25/2019     How often do you need to have someone help you when you read instructions, pamphlets, or other written materials from your doctor or pharmacy?: 1 - Never  Interpreter Needed?: No  Information entered by :: R. Darshawn Boateng LPN   Activities of Daily Living     05/16/2024    8:13 AM  In your present state of health, do you have any difficulty performing the following activities:  Hearing? 1  Comment wears aids  Vision? 0  Difficulty concentrating or making decisions? 0  Walking or climbing stairs? 0  Dressing or bathing? 0  Doing errands, shopping? 0  Preparing Food and eating ? N  Using the Toilet? N  In the past six months, have you accidently leaked urine? Y  Do you have problems with loss of bowel control? N  Managing your Medications? N  Managing your Finances? N  Housekeeping or managing your Housekeeping? N    Patient Care Team: Gretel App, NP as PCP - General (Nurse Practitioner) Darliss Rogue, MD as PCP - Cardiology (Cardiology)  I have updated your Care Teams any recent Medical Services you may have received from other providers in the past year.     Assessment:   This is a routine wellness examination for [place].  Hearing/Vision screen Hearing Screening - Comments:: Wears aids Vision Screening - Comments:: No correction   Goals Addressed             This Visit's Progress    Patient Stated       Wants to try and join an exercise group        Depression Screen     05/16/2024    8:21 AM 01/17/2024    9:13 AM 12/30/2023   11:00 AM 08/03/2023   10:47 AM  06/29/2023   11:09 AM 05/13/2023   11:16 AM 01/31/2023    9:20 AM  PHQ 2/9  Scores  PHQ - 2 Score 0 0 0 0 0 0 0  PHQ- 9 Score 0 0 0 0 0 0 0    Fall Risk     05/16/2024    8:16 AM 01/17/2024    9:13 AM 12/30/2023   11:00 AM 08/03/2023   10:46 AM 06/29/2023   11:08 AM  Fall Risk   Falls in the past year? 0 1 0 0 0  Number falls in past yr: 0 0 0 0 0  Injury with Fall? 0 1 0 0 0  Risk for fall due to : No Fall Risks No Fall Risks No Fall Risks No Fall Risks No Fall Risks  Follow up Falls evaluation completed;Falls prevention discussed Falls evaluation completed Falls evaluation completed  Falls evaluation completed    MEDICARE RISK AT HOME:  Medicare Risk at Home Any stairs in or around the home?: Yes If so, are there any without handrails?: No Home free of loose throw rugs in walkways, pet beds, electrical cords, etc?: Yes Adequate lighting in your home to reduce risk of falls?: Yes Life alert?: Yes Use of a cane, walker or w/c?: No Grab bars in the bathroom?: Yes Shower chair or bench in shower?: Yes Elevated toilet seat or a handicapped toilet?: Yes  TIMED UP AND GO:  Was the test performed?  No  Cognitive Function: 6CIT completed    02/14/2018    8:47 AM 02/10/2017    8:49 AM  MMSE - Mini Mental State Exam  Orientation to time 5 5   Orientation to Place 5 5   Registration 3 3   Attention/ Calculation 5 5   Recall 2 3   Language- name 2 objects 2 2   Language- repeat 1 1  Language- follow 3 step command 3 3   Language- read & follow direction 1 1   Write a sentence 1 1   Copy design 1 1   Total score 29 30      Data saved with a previous flowsheet row definition        05/16/2024    8:28 AM 05/13/2023   11:21 AM 04/08/2020    1:52 PM 04/06/2019   11:44 AM  6CIT Screen  What Year? 0 points 0 points 0 points 0 points  What month? 0 points 0 points 0 points 0 points  What time? 0 points 0 points 0 points 0 points  Count back from 20 0 points 0 points 0 points 0 points  Months in reverse 0 points 0 points 0 points 0 points  Repeat phrase 0  points 2 points 0 points 0 points  Total Score 0 points 2 points 0 points 0 points    Immunizations Immunization History  Administered Date(s) Administered   Fluad Quad(high Dose 65+) 07/25/2019, 08/20/2021   Fluad Trivalent(High Dose 65+) 08/03/2023   Influenza Nasal 08/04/2020   Influenza, High Dose Seasonal PF 08/05/2016, 08/08/2017, 08/14/2018, 07/30/2022   Influenza,inj,Quad PF,6+ Mos 08/24/2014, 08/05/2015   Influenza-Unspecified 08/08/2012, 08/11/2013, 08/24/2014, 08/05/2015   PFIZER(Purple Top)SARS-COV-2 Vaccination 11/14/2019, 12/05/2019, 08/07/2020   Pfizer Covid-19 Vaccine Bivalent Booster 63yrs & up 08/20/2021   Pfizer(Comirnaty )Fall Seasonal Vaccine 12 years and older 08/04/2023   Pneumococcal Conjugate-13 05/03/2014   Pneumococcal Polysaccharide-23 04/25/2005   Respiratory Syncytial Virus Vaccine,Recomb Aduvanted(Arexvy) 07/13/2022   Tdap 04/25/2012, 10/04/2019   Zoster Recombinant(Shingrix) 03/31/2017, 08/08/2017, 02/06/2018  Zoster, Live 06/26/2011    Screening Tests Health Maintenance  Topic Date Due   COVID-19 Vaccine (6 - 2024-25 season) 02/01/2024   INFLUENZA VACCINE  06/08/2024   DTaP/Tdap/Td (3 - Td or Tdap) 10/03/2029   Pneumococcal Vaccine: 50+ Years  Completed   DEXA SCAN  Completed   Zoster Vaccines- Shingrix  Completed   Hepatitis B Vaccines  Aged Out   HPV VACCINES  Aged Out   Meningococcal B Vaccine  Aged Out    Health Maintenance  Health Maintenance Due  Topic Date Due   COVID-19 Vaccine (6 - 2024-25 season) 02/01/2024   Health Maintenance Items Addressed: Discussed the need to update flu and covid vaccines annually. Patient stated that she may want to consider having a mammogram and dexa in the future and will discuss with her PCP at her next visit.   Additional Screening:  Vision Screening: Recommended annual ophthalmology exams for early detection of glaucoma and other disorders of the eye. Up to date [facility] Would you like a  referral to an eye doctor? No    Dental Screening: Recommended annual dental exams for proper oral hygiene  Community Resource Referral / Chronic Care Management: CRR required this visit?  No   CCM required this visit?  No   Plan:    I have personally reviewed and noted the following in the patient's chart:   Medical and social history Use of alcohol, tobacco or illicit drugs  Current medications and supplements including opioid prescriptions. Patient is not currently taking opioid prescriptions. Functional ability and status Nutritional status Physical activity Advanced directives List of other physicians Hospitalizations, surgeries, and ER visits in previous 12 months Vitals Screenings to include cognitive, depression, and falls Referrals and appointments  In addition, I have reviewed and discussed with patient certain preventive protocols, quality metrics, and best practice recommendations. A written personalized care plan for preventive services as well as general preventive health recommendations were provided to patient.   Angeline Fredericks, LPN   2/0/7974   After Visit Summary: (MyChart) Due to this being a telephonic visit, the after visit summary with patients personalized plan was offered to patient via MyChart   Notes: Nothing significant to report at this time.

## 2024-05-18 ENCOUNTER — Telehealth: Payer: Self-pay | Admitting: Nurse Practitioner

## 2024-05-18 NOTE — Telephone Encounter (Signed)
 Please call the patient for her current insurance information and update in the system for the coders.

## 2024-05-21 NOTE — Telephone Encounter (Signed)
 Patient was called. Patient states she is calling her insurance today. She is not happy with the insurance she was talked into and is going back to her other insurance. At time of call patient did not have info to give office. Patient was ask to contact office with new insurance information when she receives it.

## 2024-05-22 ENCOUNTER — Other Ambulatory Visit: Payer: Self-pay

## 2024-05-25 ENCOUNTER — Other Ambulatory Visit: Payer: Self-pay

## 2024-05-28 ENCOUNTER — Other Ambulatory Visit: Payer: Self-pay

## 2024-06-01 ENCOUNTER — Other Ambulatory Visit: Payer: Self-pay

## 2024-06-01 MED FILL — Gabapentin Cap 300 MG: ORAL | 30 days supply | Qty: 90 | Fill #1 | Status: AC

## 2024-06-04 ENCOUNTER — Other Ambulatory Visit: Payer: Self-pay

## 2024-06-04 ENCOUNTER — Other Ambulatory Visit: Payer: Self-pay | Admitting: Nurse Practitioner

## 2024-06-04 MED FILL — Sertraline HCl Tab 50 MG: ORAL | 90 days supply | Qty: 90 | Fill #0 | Status: AC

## 2024-06-04 MED FILL — Omeprazole Cap Delayed Release 20 MG: ORAL | 90 days supply | Qty: 90 | Fill #0 | Status: AC

## 2024-06-14 DIAGNOSIS — E042 Nontoxic multinodular goiter: Secondary | ICD-10-CM | POA: Diagnosis not present

## 2024-06-14 DIAGNOSIS — E0590 Thyrotoxicosis, unspecified without thyrotoxic crisis or storm: Secondary | ICD-10-CM | POA: Diagnosis not present

## 2024-06-15 ENCOUNTER — Other Ambulatory Visit: Payer: Self-pay

## 2024-06-15 MED ORDER — METHIMAZOLE 5 MG PO TABS
2.5000 mg | ORAL_TABLET | Freq: Every day | ORAL | 4 refills | Status: AC
  Filled 2024-06-28 – 2024-08-10 (×4): qty 45, 90d supply, fill #0
  Filled 2024-11-14: qty 45, 90d supply, fill #1
  Filled 2024-11-14: qty 45, 90d supply, fill #0

## 2024-06-25 DIAGNOSIS — C44719 Basal cell carcinoma of skin of left lower limb, including hip: Secondary | ICD-10-CM | POA: Diagnosis not present

## 2024-06-28 ENCOUNTER — Other Ambulatory Visit: Payer: Self-pay

## 2024-06-28 MED FILL — Gabapentin Cap 300 MG: ORAL | 30 days supply | Qty: 90 | Fill #2 | Status: AC

## 2024-06-29 ENCOUNTER — Other Ambulatory Visit: Payer: Self-pay

## 2024-06-30 ENCOUNTER — Other Ambulatory Visit: Payer: Self-pay

## 2024-07-04 DIAGNOSIS — H43811 Vitreous degeneration, right eye: Secondary | ICD-10-CM | POA: Diagnosis not present

## 2024-07-04 DIAGNOSIS — H04123 Dry eye syndrome of bilateral lacrimal glands: Secondary | ICD-10-CM | POA: Diagnosis not present

## 2024-07-04 DIAGNOSIS — H02055 Trichiasis without entropian left lower eyelid: Secondary | ICD-10-CM | POA: Diagnosis not present

## 2024-07-10 DIAGNOSIS — D045 Carcinoma in situ of skin of trunk: Secondary | ICD-10-CM | POA: Diagnosis not present

## 2024-07-10 DIAGNOSIS — L570 Actinic keratosis: Secondary | ICD-10-CM | POA: Diagnosis not present

## 2024-07-17 ENCOUNTER — Other Ambulatory Visit: Payer: Self-pay

## 2024-07-17 MED FILL — Hydrochlorothiazide Cap 12.5 MG: ORAL | 90 days supply | Qty: 90 | Fill #1 | Status: AC

## 2024-07-19 ENCOUNTER — Ambulatory Visit: Admitting: Nurse Practitioner

## 2024-07-19 ENCOUNTER — Other Ambulatory Visit: Payer: Self-pay

## 2024-07-25 ENCOUNTER — Ambulatory Visit: Admitting: Nurse Practitioner

## 2024-07-25 ENCOUNTER — Other Ambulatory Visit: Payer: Self-pay

## 2024-07-25 VITALS — BP 126/72 | HR 62 | Temp 97.6°F | Ht 60.0 in | Wt 134.6 lb

## 2024-07-25 DIAGNOSIS — K2970 Gastritis, unspecified, without bleeding: Secondary | ICD-10-CM | POA: Diagnosis not present

## 2024-07-25 DIAGNOSIS — K5909 Other constipation: Secondary | ICD-10-CM | POA: Diagnosis not present

## 2024-07-25 DIAGNOSIS — K2289 Other specified disease of esophagus: Secondary | ICD-10-CM | POA: Diagnosis not present

## 2024-07-25 DIAGNOSIS — I10 Essential (primary) hypertension: Secondary | ICD-10-CM

## 2024-07-25 DIAGNOSIS — G629 Polyneuropathy, unspecified: Secondary | ICD-10-CM | POA: Diagnosis not present

## 2024-07-25 DIAGNOSIS — F419 Anxiety disorder, unspecified: Secondary | ICD-10-CM

## 2024-07-25 DIAGNOSIS — E785 Hyperlipidemia, unspecified: Secondary | ICD-10-CM

## 2024-07-25 DIAGNOSIS — K219 Gastro-esophageal reflux disease without esophagitis: Secondary | ICD-10-CM | POA: Diagnosis not present

## 2024-07-25 DIAGNOSIS — Z23 Encounter for immunization: Secondary | ICD-10-CM | POA: Diagnosis not present

## 2024-07-25 DIAGNOSIS — K222 Esophageal obstruction: Secondary | ICD-10-CM | POA: Diagnosis not present

## 2024-07-25 MED ORDER — GABAPENTIN 300 MG PO CAPS
300.0000 mg | ORAL_CAPSULE | Freq: Two times a day (BID) | ORAL | 5 refills | Status: AC
  Filled 2024-07-25 – 2024-08-10 (×2): qty 60, 30d supply, fill #0
  Filled 2024-09-24: qty 60, 30d supply, fill #1
  Filled 2024-10-20: qty 60, 30d supply, fill #2
  Filled 2024-11-21: qty 60, 30d supply, fill #0

## 2024-07-25 NOTE — Progress Notes (Signed)
 Leron Glance, NP-C Phone: [phone]  Morgan Byrd is a 88 y.o. female who presents today for follow up.   Discussed the use of AI scribe software for clinical note transcription with the patient, who gave verbal consent to proceed.  History of Present Illness   Morgan Byrd is an 88 year old female who presents for a routine follow-up visit.  She experiences sciatica, primarily affecting her left side. Sleeping on one side causes pain on that side in the morning, and sitting for extended periods leads to discomfort as her rib cage and hip bone rub together. She describes herself as 'short-waisted' with long legs, which contributes to her discomfort. She has been taking gabapentin  but finds it difficult to remember the afternoon dose and is unsure if it helps.  She continues to take hydrochlorothiazide  and metoprolol  for her blood pressure, as irbesartan  was discontinued by another provider. She checks her blood pressure occasionally and reports no chest pain, shortness of breath, dizziness, or swelling.  She takes Zoloft  and notes a significant difference in her mood when she misses a dose.  Her cholesterol was noted to be elevated six months ago, but she has not made significant dietary changes. She eats less than before and sometimes has to make herself eat due to decreased appetite and taste changes. She eats at her son's house twice a week.  She takes omeprazole  daily for acid reflux, which she finds helpful. No trouble swallowing or blood in stool.  She manages occasional constipation by consuming prunes, which she finds effective without needing additional medication.      Social History   Tobacco Use  Smoking Status Former   Current packs/day: 0.00   Average packs/day: 1 pack/day for 4.0 years (4.0 ttl pk-yrs)   Types: Cigarettes   Start date: 55   Quit date: 58   Years since quitting: 51.7  Smokeless Tobacco Never    Current Outpatient Medications on File Prior  to Visit  Medication Sig Dispense Refill   amoxicillin  (AMOXIL ) 500 MG capsule Take 4 capsules (2,000 mg total) by mouth one hour before dental appointments. 8 capsule 2   aspirin EC 81 MG tablet Take 81 mg by mouth daily. Swallow whole.     B Complex-C (SUPER B COMPLEX PO) Take 1 tablet by mouth daily.     CALCIUM-MAGNESIUM-ZINC PO Take 1 tablet by mouth daily.     diclofenac  (VOLTAREN ) 75 MG EC tablet TAKE 1 TABLET BY MOUTH TWICE DAILY AS NEEDED FOR MODERATE PAIN 60 tablet 0   erythromycin  ophthalmic ointment Place 1 Application into the left eye at bedtime. (Patient taking differently: Place 1 Application into the left eye at bedtime as needed.) 3.5 g 0   hydrochlorothiazide  (MICROZIDE ) 12.5 MG capsule Take 1 capsule (12.5 mg total) by mouth daily. 90 capsule 1   methimazole  (TAPAZOLE ) 5 MG tablet Take 0.5 tablets (2.5 mg total) by mouth daily. 45 tablet 4   metoprolol  succinate (TOPROL -XL) 25 MG 24 hr tablet Take 1 tablet (25 mg total) by mouth daily. 90 tablet 3   Multiple Vitamin (MULTIVITAMIN) capsule Take 1 capsule by mouth daily.     omeprazole  (PRILOSEC) 20 MG capsule Take 1 capsule (20 mg total) by mouth daily. 90 capsule 1   sertraline  (ZOLOFT ) 50 MG tablet Take 1 tablet (50 mg total) by mouth daily. 90 tablet 1   No current facility-administered medications on file prior to visit.     ROS see history of present illness  Objective  Physical Exam Vitals:   07/25/24 1517  BP: 126/72  Pulse: 62  Temp: 97.6 F (36.4 C)  SpO2: 98%    BP Readings from Last 3 Encounters:  07/25/24 126/72  03/30/24 (!) 140/70  02/28/24 (!) 100/58   Wt Readings from Last 3 Encounters:  07/25/24 134 lb 9.6 oz (61.1 kg)  05/16/24 133 lb (60.3 kg)  03/30/24 133 lb 8 oz (60.6 kg)    Physical Exam Constitutional:      General: She is not in acute distress.    Appearance: Normal appearance.  HENT:     Head: Normocephalic.  Cardiovascular:     Rate and Rhythm: Normal rate and regular  rhythm.     Heart sounds: Normal heart sounds.  Pulmonary:     Effort: Pulmonary effort is normal.     Breath sounds: Normal breath sounds.  Skin:    General: Skin is warm and dry.  Neurological:     General: No focal deficit present.     Mental Status: She is alert.  Psychiatric:        Mood and Affect: Mood normal.        Behavior: Behavior normal.      Assessment/Plan: Please see individual problem list.  Primary hypertension Assessment & Plan: Blood pressure is well-controlled with hydrochlorothiazide  and metoprolol . Irbesartan  discontinued by cardiology. She reports no chest pain, shortness of breath, dizziness, or swelling. Continue Hydrochlorothiazide  12.5 mg daily and Toprol  XL 25 mg daily. Check BMP. Follow up with Cardiology.   Orders: -     Basic metabolic panel with GFR  Hyperlipidemia, unspecified hyperlipidemia type Assessment & Plan: Cholesterol levels were more elevated six months ago, LDL- 137. Starting medication is not recommended at her age. Focus on diet and exercise for management. We will continue to monitor.    Neuropathy Assessment & Plan: Chronic polyneuropathy presents with more symptoms on the left side. Gabapentin 's efficacy is uncertain, and she often forgets the afternoon dose. Decrease gabapentin  to twice a day. We will continue to monitor.   Orders: -     Gabapentin ; Take 1 capsule (300 mg total) by mouth 2 (two) times daily.  Dispense: 60 capsule; Refill: 5  Anxiety Assessment & Plan: Anxiety is effectively managed with Zoloft , with no current symptoms. She notices a difference on missed doses. Continue Zoloft .   Gastroesophageal reflux disease, unspecified whether esophagitis present Assessment & Plan: GERD is effectively managed with daily omeprazole . She is aware of dietary triggers and denies trouble swallowing or blood in stool. Continue omeprazole  and dietary modifications.    Need for influenza vaccination -     Flu vaccine  HIGH DOSE PF(Fluzone Trivalent)      Return in about 6 months (around 01/22/2025) for Follow up.   Leron Glance, NP-C [facility]

## 2024-07-26 ENCOUNTER — Ambulatory Visit: Payer: Self-pay | Admitting: Nurse Practitioner

## 2024-07-26 LAB — BASIC METABOLIC PANEL WITH GFR
BUN: 16 mg/dL (ref 6–23)
CO2: 32 meq/L (ref 19–32)
Calcium: 9.7 mg/dL (ref 8.4–10.5)
Chloride: 97 meq/L (ref 96–112)
Creatinine, Ser: 0.77 mg/dL (ref 0.40–1.20)
GFR: 70.17 mL/min (ref >60.00)
Glucose, Bld: 80 mg/dL (ref 70–99)
Potassium: 3.9 meq/L (ref 3.5–5.1)
Sodium: 137 meq/L (ref 135–145)

## 2024-08-09 ENCOUNTER — Encounter: Payer: Self-pay | Admitting: Nurse Practitioner

## 2024-08-09 DIAGNOSIS — K219 Gastro-esophageal reflux disease without esophagitis: Secondary | ICD-10-CM | POA: Insufficient documentation

## 2024-08-09 NOTE — Assessment & Plan Note (Signed)
 Anxiety is effectively managed with Zoloft, with no current symptoms. She notices a difference on missed doses. Continue Zoloft.

## 2024-08-09 NOTE — Assessment & Plan Note (Signed)
 Chronic polyneuropathy presents with more symptoms on the left side. Gabapentin 's efficacy is uncertain, and she often forgets the afternoon dose. Decrease gabapentin  to twice a day. We will continue to monitor.

## 2024-08-09 NOTE — Assessment & Plan Note (Signed)
 Blood pressure is well-controlled with hydrochlorothiazide  and metoprolol . Irbesartan  discontinued by cardiology. She reports no chest pain, shortness of breath, dizziness, or swelling. Continue Hydrochlorothiazide  12.5 mg daily and Toprol  XL 25 mg daily. Check BMP. Follow up with Cardiology.

## 2024-08-09 NOTE — Assessment & Plan Note (Signed)
 Cholesterol levels were more elevated six months ago, LDL- 137. Starting medication is not recommended at her age. Focus on diet and exercise for management. We will continue to monitor.

## 2024-08-09 NOTE — Assessment & Plan Note (Signed)
 GERD is effectively managed with daily omeprazole . She is aware of dietary triggers and denies trouble swallowing or blood in stool. Continue omeprazole  and dietary modifications.

## 2024-08-10 ENCOUNTER — Other Ambulatory Visit: Payer: Self-pay

## 2024-08-10 MED ORDER — FLUZONE HIGH-DOSE 0.5 ML IM SUSY
0.5000 mL | PREFILLED_SYRINGE | Freq: Once | INTRAMUSCULAR | 0 refills | Status: DC
Start: 2024-08-10 — End: 2024-08-10
  Filled 2024-08-10: qty 0.5, 1d supply, fill #0

## 2024-08-10 MED ORDER — COMIRNATY 30 MCG/0.3ML IM SUSY
0.3000 mL | PREFILLED_SYRINGE | Freq: Once | INTRAMUSCULAR | 0 refills | Status: AC
Start: 2024-08-10 — End: 2024-08-11
  Filled 2024-08-10: qty 0.3, 1d supply, fill #0

## 2024-08-10 MED FILL — Omeprazole Cap Delayed Release 20 MG: ORAL | 90 days supply | Qty: 90 | Fill #1 | Status: CN

## 2024-08-11 MED FILL — Omeprazole Cap Delayed Release 20 MG: ORAL | 90 days supply | Qty: 90 | Fill #1 | Status: AC

## 2024-08-17 ENCOUNTER — Encounter: Payer: Self-pay | Admitting: Pharmacist

## 2024-08-17 NOTE — Progress Notes (Signed)
 Pharmacy Quality Measure Review  This patient is appearing on a report for being at risk of failing the adherence measure for hypertension (ACEi/ARB) medications this calendar year.   Medication: irbesartan  Last fill date: 01/27/24 for 90 day supply  Medication has been discontinued by cardiology. Will continue to appear on adherence list.  No further action at this time.

## 2024-08-21 DIAGNOSIS — G5601 Carpal tunnel syndrome, right upper limb: Secondary | ICD-10-CM | POA: Diagnosis not present

## 2024-08-21 DIAGNOSIS — M65331 Trigger finger, right middle finger: Secondary | ICD-10-CM | POA: Diagnosis not present

## 2024-08-27 ENCOUNTER — Encounter: Payer: Self-pay | Admitting: Family Medicine

## 2024-08-27 ENCOUNTER — Ambulatory Visit (INDEPENDENT_AMBULATORY_CARE_PROVIDER_SITE_OTHER): Admitting: Family Medicine

## 2024-08-27 ENCOUNTER — Other Ambulatory Visit: Payer: Self-pay

## 2024-08-27 ENCOUNTER — Ambulatory Visit: Payer: Self-pay

## 2024-08-27 VITALS — BP 156/71 | HR 58 | Temp 98.7°F | Ht 60.0 in | Wt 133.8 lb

## 2024-08-27 DIAGNOSIS — J0100 Acute maxillary sinusitis, unspecified: Secondary | ICD-10-CM

## 2024-08-27 DIAGNOSIS — R051 Acute cough: Secondary | ICD-10-CM | POA: Diagnosis not present

## 2024-08-27 DIAGNOSIS — J302 Other seasonal allergic rhinitis: Secondary | ICD-10-CM

## 2024-08-27 MED ORDER — AZITHROMYCIN 250 MG PO TABS
ORAL_TABLET | ORAL | 0 refills | Status: AC
  Filled 2024-08-27: qty 6, 5d supply, fill #0

## 2024-08-27 NOTE — Progress Notes (Signed)
 Established patient visit   Patient: Morgan Byrd   DOB: 06-May-1936   88 y.o. Female  MRN: [id number] Visit Date: 08/27/2024  Today's healthcare provider: LAURAINE LOISE BUOY, DO   Chief Complaint  Patient presents with   Acute Visit    Patient reports that she has a small cough, voice has been raspy for about 3 weeks.  States that she does have some sinus drainage and when taking deep breaths like 3 in a row, she feels sort of dizzy.  Reports sinus pressure on the bridge of her nose and sides of the nose.  Tried using the nettie pot but it just burns.   Subjective    HPI Morgan Byrd is an 88 year old female who presents with a persistent cough for three weeks.  She has experienced a persistent cough for three weeks, which began after a cold. The cough is described as a sensation of her voice vibrating, localized in the throat area. No fever or chills are present, but she experiences dizziness when taking deep breaths, particularly at the top of her head.  She has a history of sinus issues and allergies, which she believes contribute to drainage that exacerbates her cough. She has not taken any medications for the cough since the initial week of her cold, as she felt the cold had resolved. No ear pain or sore throat is reported.  Her allergies include codeine, Fosamax, Lexapro, and Spiriva , with no known antibiotic allergies.  She mentions feeling fatigued and having less energy over the past three weeks, leading to more sedentary behavior than usual.      Medications: Outpatient Medications Prior to Visit  Medication Sig   amoxicillin  (AMOXIL ) 500 MG capsule Take 4 capsules (2,000 mg total) by mouth one hour before dental appointments.   aspirin EC 81 MG tablet Take 81 mg by mouth daily. Swallow whole.   B Complex-C (SUPER B COMPLEX PO) Take 1 tablet by mouth daily.   CALCIUM-MAGNESIUM-ZINC PO Take 1 tablet by mouth daily.   diclofenac  (VOLTAREN ) 75 MG EC tablet TAKE 1  TABLET BY MOUTH TWICE DAILY AS NEEDED FOR MODERATE PAIN   erythromycin  ophthalmic ointment Place 1 Application into the left eye at bedtime. (Patient taking differently: Place 1 Application into the left eye at bedtime as needed.)   gabapentin  (NEURONTIN ) 300 MG capsule Take 1 capsule (300 mg total) by mouth 2 (two) times daily.   hydrochlorothiazide  (MICROZIDE ) 12.5 MG capsule Take 1 capsule (12.5 mg total) by mouth daily.   methimazole  (TAPAZOLE ) 5 MG tablet Take 0.5 tablets (2.5 mg total) by mouth daily.   metoprolol  succinate (TOPROL -XL) 25 MG 24 hr tablet Take 1 tablet (25 mg total) by mouth daily.   Multiple Vitamin (MULTIVITAMIN) capsule Take 1 capsule by mouth daily.   omeprazole  (PRILOSEC) 20 MG capsule Take 1 capsule (20 mg total) by mouth daily.   sertraline  (ZOLOFT ) 50 MG tablet Take 1 tablet (50 mg total) by mouth daily.   No facility-administered medications prior to visit.        Objective    BP (!) 156/71 (BP Location: Left Arm, Patient Position: Sitting, Cuff Size: Normal)   Pulse (!) 58   Temp 98.7 F (37.1 C) (Oral)   Ht 5' (1.524 m)   Wt 133 lb 12.8 oz (60.7 kg)   SpO2 98%   BMI 26.13 kg/m     Physical Exam Vitals reviewed.  Constitutional:      General:  She is not in acute distress.    Appearance: Normal appearance. She is well-developed. She is not diaphoretic.  HENT:     Head: Normocephalic and atraumatic.     Comments: Tenderness over maxillary sinuses bilaterally.    Right Ear: Tympanic membrane, ear canal and external ear normal.     Left Ear: Tympanic membrane, ear canal and external ear normal.     Nose: Nose normal. No congestion or rhinorrhea.     Mouth/Throat:     Mouth: Mucous membranes are moist.     Pharynx: Oropharynx is clear. No oropharyngeal exudate.  Eyes:     General: No scleral icterus.    Conjunctiva/sclera: Conjunctivae normal.     Pupils: Pupils are equal, round, and reactive to light.  Cardiovascular:     Rate and Rhythm:  Normal rate and regular rhythm.     Pulses: Normal pulses.     Heart sounds: Normal heart sounds. No murmur heard. Pulmonary:     Effort: Pulmonary effort is normal. No respiratory distress.     Breath sounds: Normal breath sounds. No wheezing or rales.  Musculoskeletal:     Cervical back: Neck supple.     Right lower leg: No edema.     Left lower leg: No edema.  Lymphadenopathy:     Cervical: No cervical adenopathy.  Skin:    General: Skin is warm and dry.     Findings: No rash.  Neurological:     Mental Status: She is alert.      No results found for any visits on 08/27/24.  Assessment & Plan    Acute non-recurrent maxillary sinusitis -     Azithromycin ; Take 2 tablets by mouth on day 1, then 1 tablet daily on days 2 through 5  Dispense: 6 tablet; Refill: 0  Acute cough  Seasonal allergies     Acute nonrecurrent maxillary sinusitis; acute cough Cough persisting for three weeks, likely due to upper respiratory tract infection and allergic rhinitis. No fever, chills, ear pain, or sore throat. Dizziness upon deep breathing. Cough attributed to sinus drainage. - Prescribed azithromycin  to address drainage and improve cough.  Seasonal allergies Chronic sinus issues with allergies contributing to drainage and cough. - Consider starting a daily Claritin, Zyrtec, or Allegra  (or generic equivalent)    No follow-ups on file.      I discussed the assessment and treatment plan with the patient  The patient was provided an opportunity to ask questions and all were answered. The patient agreed with the plan and demonstrated an understanding of the instructions.   The patient was advised to call back or seek an in-person evaluation if the symptoms worsen or if the condition fails to improve as anticipated.    LAURAINE LOISE BUOY, DO  [facility] [phone] (phone) [phone] (fax)  [facility]

## 2024-08-27 NOTE — Telephone Encounter (Signed)
 FYI Only or Action Required?: Action required by provider: request for appointment and clinical question for provider.  Patient was last seen in primary care on 07/25/2024 by Morgan App, NP.  Called Nurse Triage reporting No chief complaint on file..  Symptoms began several weeks ago.  Interventions attempted: Rest, hydration, or home remedies.  Symptoms are: unchanged.  Triage Disposition: See Physician Within 24 Hours  Patient/caregiver understands and will follow disposition?: Yes   Copied from CRM #[id number]. Topic: Clinical - Red Word Triage >> Aug 27, 2024 10:24 AM Eva FALCON wrote: Red Word that prompted transfer to Nurse Triage: Dealing with cold for 3 weeks. Won't go away, head stopped up, coughing and blowing out green mucus. Reason for Disposition  [1] Continuous (nonstop) coughing interferes with work or school AND [2] no improvement using cough treatment per Care Advice  Answer Assessment - Initial Assessment Questions 1. ONSET: When did the cough begin?      3 Weeks Ago  2. SEVERITY: How bad is the cough today?      Intermittent, Worse During the Morning  3. SPUTUM: Describe the color of your sputum (e.g., none, dry cough; clear, white, yellow, green)     Yes, Green Sputum  4. HEMOPTYSIS: Are you coughing up any blood? If Yes, ask: How much? (e.g., flecks, streaks, tablespoons, etc.)     No  5. DIFFICULTY BREATHING: Are you having difficulty breathing? If Yes, ask: How bad is it? (e.g., mild, moderate, severe)      Denies  6. FEVER: Do you have a fever? If Yes, ask: What is your temperature, how was it measured, and when did it start?     No  7. CARDIAC HISTORY: Do you have any history of heart disease? (e.g., heart attack, congestive heart failure)      Hypertension  8. LUNG HISTORY: Do you have any history of lung disease?  (e.g., pulmonary embolus, asthma, emphysema)     Denies  9. PE RISK FACTORS: Do you have a history of blood  clots? (or: recent major surgery, recent prolonged travel, bedridden)     No  10. OTHER SYMPTOMS: Do you have any other symptoms? (e.g., runny nose, wheezing, chest pain)       Headache  11. PREGNANCY: Is there any chance you are pregnant? When was your last menstrual period?       No and No  12. TRAVEL: Have you traveled out of the country in the last month? (e.g., travel history, exposures)       No  Protocols used: Cough - Acute Productive-A-AH

## 2024-09-06 ENCOUNTER — Other Ambulatory Visit: Payer: Self-pay

## 2024-09-06 MED FILL — Metoprolol Succinate Tab ER 24HR 25 MG (Tartrate Equiv): ORAL | 90 days supply | Qty: 90 | Fill #2 | Status: AC

## 2024-09-24 ENCOUNTER — Other Ambulatory Visit: Payer: Self-pay

## 2024-09-25 ENCOUNTER — Other Ambulatory Visit: Payer: Self-pay

## 2024-09-25 MED ORDER — OXYCODONE HCL 5 MG PO TABS
5.0000 mg | ORAL_TABLET | ORAL | 0 refills | Status: DC
  Filled 2024-09-25: qty 10, 2d supply, fill #0

## 2024-10-13 ENCOUNTER — Other Ambulatory Visit: Payer: Self-pay | Admitting: Nurse Practitioner

## 2024-10-13 DIAGNOSIS — I10 Essential (primary) hypertension: Secondary | ICD-10-CM

## 2024-10-13 DIAGNOSIS — M48061 Spinal stenosis, lumbar region without neurogenic claudication: Secondary | ICD-10-CM

## 2024-10-13 MED FILL — Sertraline HCl Tab 50 MG: ORAL | 90 days supply | Qty: 90 | Fill #1 | Status: AC

## 2024-10-14 ENCOUNTER — Other Ambulatory Visit: Payer: Self-pay

## 2024-10-15 ENCOUNTER — Other Ambulatory Visit: Payer: Self-pay

## 2024-10-15 ENCOUNTER — Other Ambulatory Visit: Payer: Self-pay | Admitting: Nurse Practitioner

## 2024-10-15 DIAGNOSIS — M48061 Spinal stenosis, lumbar region without neurogenic claudication: Secondary | ICD-10-CM

## 2024-10-15 DIAGNOSIS — I10 Essential (primary) hypertension: Secondary | ICD-10-CM

## 2024-10-15 MED FILL — Hydrochlorothiazide Cap 12.5 MG: ORAL | 90 days supply | Qty: 90 | Fill #0 | Status: CN

## 2024-10-15 MED FILL — Omeprazole Cap Delayed Release 20 MG: ORAL | 90 days supply | Qty: 90 | Fill #0 | Status: CN

## 2024-10-16 ENCOUNTER — Other Ambulatory Visit: Payer: Self-pay

## 2024-10-16 ENCOUNTER — Other Ambulatory Visit: Payer: Self-pay | Admitting: Nurse Practitioner

## 2024-10-16 DIAGNOSIS — M48061 Spinal stenosis, lumbar region without neurogenic claudication: Secondary | ICD-10-CM

## 2024-10-16 MED FILL — Diclofenac Sodium Tab Delayed Release 75 MG: ORAL | 30 days supply | Qty: 60 | Fill #0 | Status: AC

## 2024-10-17 ENCOUNTER — Other Ambulatory Visit: Payer: Self-pay

## 2024-10-20 ENCOUNTER — Emergency Department: Admission: EM | Admit: 2024-10-20 | Discharge: 2024-10-20 | Disposition: A | Discharge status: Home / Self Care

## 2024-10-20 ENCOUNTER — Other Ambulatory Visit: Payer: Self-pay

## 2024-10-20 ENCOUNTER — Emergency Department

## 2024-10-20 DIAGNOSIS — S0990XA Unspecified injury of head, initial encounter: Secondary | ICD-10-CM | POA: Diagnosis not present

## 2024-10-20 DIAGNOSIS — S7002XA Contusion of left hip, initial encounter: Secondary | ICD-10-CM

## 2024-10-20 DIAGNOSIS — S300XXA Contusion of lower back and pelvis, initial encounter: Secondary | ICD-10-CM | POA: Diagnosis not present

## 2024-10-20 DIAGNOSIS — W010XXA Fall on same level from slipping, tripping and stumbling without subsequent striking against object, initial encounter: Secondary | ICD-10-CM | POA: Diagnosis not present

## 2024-10-20 DIAGNOSIS — M25552 Pain in left hip: Secondary | ICD-10-CM | POA: Diagnosis present

## 2024-10-20 DIAGNOSIS — W19XXXA Unspecified fall, initial encounter: Secondary | ICD-10-CM

## 2024-10-20 DIAGNOSIS — I10 Essential (primary) hypertension: Secondary | ICD-10-CM | POA: Diagnosis not present

## 2024-10-20 MED FILL — Hydrochlorothiazide Cap 12.5 MG: ORAL | 90 days supply | Qty: 90 | Fill #0 | Status: AC

## 2024-10-20 NOTE — ED Provider Notes (Signed)
 [facility] Emergency Department Provider Note     Event Date/Time   First MD Initiated Contact with Patient 10/20/24 2014     (approximate)   History   Fall   HPI  Morgan Byrd is a 88 y.o. female with a history of HTN, anxiety, fracture, and sciatica, presents to the ED accompanied by family.  Patient was with her son after a mechanical fall earlier today.  She denies any dizziness, chest pain, shortness of breath.  She believes she tripped over an uneven surface, falling on her left buttocks and the left side of her head.  No reports of any LOC, nausea, vomiting, or weakness.  Patient has been amatory but endorses pain to the left hip.  Physical Exam   Triage Vital Signs: ED Triage Vitals  Encounter Vitals Group     BP 10/20/24 1824 (!) 91/59     Girls Systolic BP Percentile --      Girls Diastolic BP Percentile --      Boys Systolic BP Percentile --      Boys Diastolic BP Percentile --      Pulse Rate 10/20/24 1824 61     Resp 10/20/24 1824 20     Temp 10/20/24 1824 98.1 F (36.7 C)     Temp Source 10/20/24 1824 Oral     SpO2 10/20/24 1824 95 %     Weight 10/20/24 1826 129 lb (58.5 kg)     Height 10/20/24 1826 5' 1 (1.549 m)     Head Circumference --      Peak Flow --      Pain Score 10/20/24 1825 0     Pain Loc --      Pain Education --      Exclude from Growth Chart --     Most recent vital signs: Vitals:   10/20/24 1824 10/20/24 2054  BP: (!) 91/59 101/65  Pulse: 61 63  Resp: 20 17  Temp: 98.1 F (36.7 C) 98.3 F (36.8 C)  SpO2: 95% 100%    General Awake, no distress.  NAD HEENT NCAT. PERRL. EOMI. No rhinorrhea. Mucous membranes are moist.  CV:  Good peripheral perfusion. RRR RESP:  Normal effort. CTA MSK:  Normal flexion and to range of the lower extremities.  Patient's left hip and buttocks with a large hematoma noted. NEURO: Cranial nerves II to XII grossly intact   ED Results / Procedures / Treatments    Labs (all labs ordered are listed, but only abnormal results are displayed) Labs Reviewed - No data to display   EKG   RADIOLOGY  I personally viewed and evaluated these images as part of my medical decision making, as well as reviewing the written report by the radiologist.  ED Provider Interpretation: No acute fracture or dislocation  DG HIP UNILAT WITH PELVIS 2-3 VIEWS LEFT Result Date: 10/20/2024 EXAM: 2 OR MORE VIEW(S) XRAY OF THE LEFT HIP 10/20/2024 08:21:00 PM COMPARISON: None available. CLINICAL HISTORY: 809823 Fall 190176 FINDINGS: BONES AND JOINTS: No acute fracture. No malalignment. SOFT TISSUES: Surrounding soft tissue density is noted adjacent to the left iliac wing, likely related to subcutaneous hematoma from the recent fall. IMPRESSION: 1. No acute fracture or dislocation of the left hip or visualized pelvis. 2. Soft tissue swelling adjacent to the left iliac wing, compatible with a subcutaneous hematoma from recent trauma. Electronically signed by: Oneil Devonshire MD 10/20/2024 08:26 PM EST RP Workstation: [id number]   CT Cervical Spine  Wo Contrast Result Date: 10/20/2024 EXAM: CT CERVICAL SPINE WITHOUT CONTRAST 10/20/2024 07:28:43 PM TECHNIQUE: CT of the cervical spine was performed without the administration of intravenous contrast. Multiplanar reformatted images are provided for review. Automated exposure control, iterative reconstruction, and/or weight based adjustment of the mA/kV was utilized to reduce the radiation dose to as low as reasonably achievable. COMPARISON: 06/22/2023 CLINICAL HISTORY: Neck trauma (Age >= 65y) FINDINGS: BONES AND ALIGNMENT: No acute fracture or traumatic malalignment. DEGENERATIVE CHANGES: No significant degenerative changes. SOFT TISSUES: No prevertebral soft tissue swelling. IMPRESSION: 1. No significant abnormality Electronically signed by: Franky Stanford MD 10/20/2024 07:38 PM EST RP Workstation: [id number]   CT Head Wo Contrast Result Date:  10/20/2024 EXAM: CT HEAD WITHOUT 10/20/2024 07:28:43 PM TECHNIQUE: CT of the head was performed without the administration of intravenous contrast. Automated exposure control, iterative reconstruction, and/or weight based adjustment of the mA/kV was utilized to reduce the radiation dose to as low as reasonably achievable. COMPARISON: 06/22/2023 CLINICAL HISTORY: Head trauma, moderate-severe FINDINGS: BRAIN AND VENTRICLES: No acute intracranial hemorrhage. No mass effect or midline shift. No extra-axial fluid collection. No evidence of acute infarct. No hydrocephalus. Atherosclerosis of skullbase vasculature without hyperdense vessel or abnormal calcification. ORBITS: Bilateral cataract resection noted. No acute abnormality. SINUSES AND MASTOIDS: No acute abnormality. SOFT TISSUES AND SKULL: No acute skull fracture. No acute soft tissue abnormality. IMPRESSION: 1. No acute intracranial abnormality related to head trauma. Electronically signed by: Franky Stanford MD 10/20/2024 07:34 PM EST RP Workstation: [id number]     PROCEDURES:  Critical Care performed: No  Procedures   MEDICATIONS ORDERED IN ED: Medications - No data to display   IMPRESSION / MDM / ASSESSMENT AND PLAN / ED COURSE  I reviewed the triage vital signs and the nursing notes.                              Differential diagnosis includes, but is not limited to, fracture, dislocation, SDH, hematoma  Patient's presentation is most consistent with acute complicated illness / injury requiring diagnostic workup.  Patient's diagnosis is consistent with mechanical fall resulting in left hip contusion and hematoma.  Geriatric patient with otherwise reassuring exam and workup at this time.  My review of CTs and plain films show no acute intracranial or cervical process.  No x-ray evidence of any acute hip or pelvic fracture.  Clinically the patient has a large hematoma related to her fall.  No neurodeficits noted.  Patient will be discharged  home, in stable condition with instructions for home care. Patient is to follow up with her primary provider as needed or otherwise directed. Patient is given ED precautions to return to the ED for any worsening or new symptoms.     FINAL CLINICAL IMPRESSION(S) / ED DIAGNOSES   Final diagnoses:  Fall, initial encounter  Contusion of left hip, initial encounter  Hematoma of left hip, initial encounter     Rx / DC Orders   ED Discharge Orders     None        Note:  This document was prepared using Dragon voice recognition software and may include unintentional dictation errors.       Loyd Candida LULLA Aldona, PA-C 10/21/24 0032    Fernand Rossie HERO, MD 10/24/24 [phone]

## 2024-10-20 NOTE — ED Triage Notes (Signed)
 Pt to ED with son for fall onto back today. Pt denies getting dizzy. Pt thinks may have tripped over uneven surface. Pt fell onto L buttock and side of head. Speech clear, no obvious deformities. Pt denies pain. L hip pain if walks.

## 2024-10-20 NOTE — Discharge Instructions (Signed)
 Your exam, CT scans, and x-rays are all normal for any signs of any fracture or dislocation.  You do have a large hematoma to the hip.  Apply ice to the area to help reduce swelling.  Take OTC Tylenol  as needed.  Follow-up with your primary provider for ongoing evaluation.

## 2024-10-25 ENCOUNTER — Ambulatory Visit: Payer: Self-pay

## 2024-10-25 NOTE — Telephone Encounter (Signed)
 Attempted to call pt x1. VM left for pt. Will attempt to contact pt at a later time.   Message from [place] G sent at 10/25/2024  8:32 AM EST  Summary: hematoma on leg after slip and fall over weekened, er visit 10/20/24.   Reason for Triage: Patient had a slip and fall over the weekend. She went to the ER and was told to follow up with her pcp. Next appt is 01/14, patient is requesting something sooner due to a large hematoma on her leg. She states its warm to the touch and that is the only symptom is experiencing. Patient did not want to hold, but is requesting a call back [phone].

## 2024-10-25 NOTE — Telephone Encounter (Signed)
 FYI Only or Action Required?: Action required by provider: request for appointment.  Patient was last seen in primary care on 08/27/2024 by Donzella Lauraine SAILOR, DO.  Called Nurse Triage reporting hematoma.  Symptoms began a week ago.  Interventions attempted: Rest, hydration, or home remedies and Ice/heat application.  Symptoms are: stable.  Triage Disposition: See PCP When Office is Open (Within 3 Days)  Patient/caregiver understands and will follow disposition?: Yes  Reason for Disposition  [1] After 10 days AND [2] bruise not fading  Answer Assessment - Initial Assessment Questions Patient experienced a fall on 10/20/24 and was evaluated in ED and discharged home. Patient reports having a large hematoma that has barely shrunk in size but has not gotten worse, mildly sore. Previously icing / heating the area, Arnicare cream. Patient requesting ER f/u appointment and wants the hematoma checked, no appointments available within dispo. Please advise. Requesting call back. [phone]  1. SIZE: How large is the bruise?      A good sized orange  2. NUMBER: How many bruises are there?      1  3. LOCATION: Where is the bruise located?      Right below waist left side  4. ONSET: How long ago did the bruise occur?      10/20/24  5. CAUSE: What do you think caused the bruise?     Fall  6. MEDICINES: Do you take any medicines which thin the blood such as: aspirin, apixaban, heparin, ibuprofen (NSAIDS), Plavix, or Coumadin?     1 baby aspirin at night  7. OTHER SYMPTOMS: Do you have any other symptoms?  (e.g., weakness, dizziness, pain, fever, nosebleed, blood in urine/stool)     Denies  Protocols used: Bruises-A-AH

## 2024-10-25 NOTE — Telephone Encounter (Signed)
 Attempted to call pt x2. VM left for pt. Will attempt to contact pt at a later time.   Message from [place] G sent at 10/25/2024  8:32 AM EST  Summary: hematoma on leg after slip and fall over weekened, er visit 10/20/24.   Reason for Triage: Patient had a slip and fall over the weekend. She went to the ER and was told to follow up with her pcp. Next appt is 01/14, patient is requesting something sooner due to a large hematoma on her leg. She states its warm to the touch and that is the only symptom is experiencing. Patient did not want to hold, but is requesting a call back [phone].

## 2024-10-26 NOTE — Telephone Encounter (Signed)
 Called and got pt scheduled for a follow up

## 2024-11-02 ENCOUNTER — Encounter: Payer: Self-pay | Admitting: Nurse Practitioner

## 2024-11-02 ENCOUNTER — Ambulatory Visit: Admitting: Nurse Practitioner

## 2024-11-02 ENCOUNTER — Ambulatory Visit

## 2024-11-02 VITALS — BP 130/70 | HR 56 | Temp 97.9°F | Ht 61.0 in | Wt 132.4 lb

## 2024-11-02 DIAGNOSIS — I10 Essential (primary) hypertension: Secondary | ICD-10-CM | POA: Diagnosis not present

## 2024-11-02 DIAGNOSIS — S7002XD Contusion of left hip, subsequent encounter: Secondary | ICD-10-CM | POA: Diagnosis not present

## 2024-11-02 DIAGNOSIS — M810 Age-related osteoporosis without current pathological fracture: Secondary | ICD-10-CM | POA: Diagnosis not present

## 2024-11-02 DIAGNOSIS — S7002XA Contusion of left hip, initial encounter: Secondary | ICD-10-CM | POA: Insufficient documentation

## 2024-11-02 NOTE — Assessment & Plan Note (Signed)
 She has an increased fracture risk due to falls, with a recent fall noted but no fracture. Fall prevention strategies, including cautious movements, are encouraged.

## 2024-11-02 NOTE — Assessment & Plan Note (Signed)
 Her low blood pressure readings may be due to drug-induced hypotension. She is currently on metoprolol  and hydrochlorothiazide . Hydrochlorothiazide  will be adjusted based on blood pressure monitoring. She should monitor her blood pressure daily for two weeks, use hydrochlorothiazide  12.5 mg as needed, and continue metoprolol  XL 25 mg as her pulse is acceptable. We will continue to monitor closely.

## 2024-11-02 NOTE — Assessment & Plan Note (Signed)
 She experiences persistent pain and swelling with a large hematoma following a fall, but initial x-rays show no fracture. She is reassured about the healing process and expected color changes. A repeat x-ray is ordered to rule out any missed fracture. Continued icing is advised to reduce swelling, and protective padding is discussed to prevent further trauma.

## 2024-11-02 NOTE — Progress Notes (Signed)
 " Leron Glance, NP-C Phone: [phone]  Morgan Byrd is a 88 y.o. female who presents today for hospital follow up.   Discussed the use of AI scribe software for clinical note transcription with the patient, who gave verbal consent to proceed.  History of Present Illness   Morgan Byrd is an 88 year old female who presents with a hip hematoma following a fall. She is accompanied by her daughter, Morgan Byrd.  Approximately two weeks ago, she experienced a fall at home while attempting to enter her house through a door with a keypad. During the fall, she hit her hip on a chair and her head on a storm door. She does not recall the exact reason for the fall, but her son, who was present, mentioned she might have turned around to go back inside when she fell. She developed a large hematoma on her hip, described as being the size of a grapefruit or pasta bowl, which has persisted since the fall.  Following the fall, she was taken to the emergency room where a CT scan of her head and neck was performed due to her history of a neck fracture in 2020. The CT scan showed no new fractures. An X-ray of her hip was also performed, which did not reveal any fractures. She initially treated the hematoma with alternating heat and ice, but discontinued as it did not seem effective. The hematoma remains painful, especially when bumped, and has not reduced in size.  She has a history of osteoporosis, which raises concerns about the potential for fractures with falls. She also has a history of sciatica and underwent carpal tunnel and trigger finger surgery a few weeks ago.  She is currently taking metoprolol  and hydrochlorothiazide  for blood pressure management. Her blood pressure was noted to be low at the emergency room, and she has experienced lightheadedness upon standing. She has not taken her medication today as she has not eaten. She occasionally checks her blood pressure at home but not regularly. She is concerned  about her pulse, which was noted to be low at times, but has been stable in the sixties during this visit.  She has a history of falls, including a previous incident where she fractured her neck while carrying a pie and tripping over a dog bed. She also had a fall two years ago on the same porch where she cut the back of her head. She is described as moving quickly and sometimes bending over to pick things up, which has led to near falls.      Tobacco Use History[1]  Medications Ordered Prior to Encounter[2]   ROS see history of present illness  Objective  Physical Exam Vitals:   11/02/24 1007  BP: 130/70  Pulse: (!) 56  Temp: 97.9 F (36.6 C)  SpO2: 95%    BP Readings from Last 3 Encounters:  11/02/24 130/70  10/20/24 101/65  08/27/24 (!) 156/71   Wt Readings from Last 3 Encounters:  11/02/24 132 lb 6.4 oz (60.1 kg)  10/20/24 129 lb (58.5 kg)  08/27/24 133 lb 12.8 oz (60.7 kg)    Physical Exam Constitutional:      General: She is not in acute distress.    Appearance: Normal appearance.  HENT:     Head: Normocephalic.  Cardiovascular:     Rate and Rhythm: Normal rate and regular rhythm.     Heart sounds: Normal heart sounds.  Pulmonary:     Effort: Pulmonary effort is normal.  Breath sounds: Normal breath sounds.  Musculoskeletal:     Left hip: Deformity and tenderness present.     Comments: Large hematoma and contusion noted. See pic below  Skin:    General: Skin is warm and dry.  Neurological:     General: No focal deficit present.     Mental Status: She is alert.  Psychiatric:        Mood and Affect: Mood normal.        Behavior: Behavior normal.      Assessment/Plan: Please see individual problem list.  Contusion of left hip, subsequent encounter Assessment & Plan: She experiences persistent pain and swelling with a large hematoma following a fall, but initial x-rays show no fracture. She is reassured about the healing process and expected  color changes. A repeat x-ray is ordered to rule out any missed fracture. Continued icing is advised to reduce swelling, and protective padding is discussed to prevent further trauma.  Orders: -     DG HIP UNILAT W OR W/O PELVIS 2-3 VIEWS LEFT; Future  Hematoma of left hip, subsequent encounter Assessment & Plan: She experiences persistent pain and swelling with a large hematoma following a fall, but initial x-rays show no fracture. She is reassured about the healing process and expected color changes. A repeat x-ray is ordered to rule out any missed fracture. Continued icing is advised to reduce swelling, and protective padding is discussed to prevent further trauma.  Orders: -     DG HIP UNILAT W OR W/O PELVIS 2-3 VIEWS LEFT; Future  Primary hypertension Assessment & Plan: Her low blood pressure readings may be due to drug-induced hypotension. She is currently on metoprolol  and hydrochlorothiazide . Hydrochlorothiazide  will be adjusted based on blood pressure monitoring. She should monitor her blood pressure daily for two weeks, use hydrochlorothiazide  12.5 mg as needed, and continue metoprolol  XL 25 mg as her pulse is acceptable. We will continue to monitor closely.    Age-related osteoporosis without current pathological fracture Assessment & Plan: She has an increased fracture risk due to falls, with a recent fall noted but no fracture. Fall prevention strategies, including cautious movements, are encouraged.      Return if symptoms worsen or fail to improve, for as scheduled.   Leron Glance, NP-C [facility] [place]     [1]  Social History Tobacco Use  Smoking Status Former   Current packs/day: 0.00   Average packs/day: 1 pack/day for 4.0 years (4.0 ttl pk-yrs)   Types: Cigarettes   Start date: 79   Quit date: 1974   Years since quitting: 52.0  Smokeless Tobacco Never  [2]  Current Outpatient Medications on File Prior to Visit  Medication Sig  Dispense Refill   amoxicillin  (AMOXIL ) 500 MG capsule Take 4 capsules (2,000 mg total) by mouth one hour before dental appointments. 8 capsule 2   aspirin EC 81 MG tablet Take 81 mg by mouth daily. Swallow whole.     B Complex-C (SUPER B COMPLEX PO) Take 1 tablet by mouth daily.     CALCIUM-MAGNESIUM-ZINC PO Take 1 tablet by mouth daily.     diclofenac  (VOLTAREN ) 75 MG EC tablet Take 1 tablet (75 mg total) by mouth 2 (two) times daily as needed for moderate pain. 60 tablet 1   gabapentin  (NEURONTIN ) 300 MG capsule Take 1 capsule (300 mg total) by mouth 2 (two) times daily. 60 capsule 5   hydrochlorothiazide  (MICROZIDE ) 12.5 MG capsule Take 1 capsule (12.5 mg total)  by mouth daily. 90 capsule 3   methimazole  (TAPAZOLE ) 5 MG tablet Take 0.5 tablets (2.5 mg total) by mouth daily. 45 tablet 4   metoprolol  succinate (TOPROL -XL) 25 MG 24 hr tablet Take 1 tablet (25 mg total) by mouth daily. 90 tablet 3   Multiple Vitamin (MULTIVITAMIN) capsule Take 1 capsule by mouth daily.     omeprazole  (PRILOSEC) 20 MG capsule Take 1 capsule (20 mg total) by mouth daily. 90 capsule 3   sertraline  (ZOLOFT ) 50 MG tablet Take 1 tablet (50 mg total) by mouth daily. 90 tablet 1   erythromycin  ophthalmic ointment Place 1 Application into the left eye at bedtime. (Patient taking differently: Place 1 Application into the left eye at bedtime as needed.) 3.5 g 0   No current facility-administered medications on file prior to visit.   "

## 2024-11-14 ENCOUNTER — Other Ambulatory Visit: Payer: Self-pay

## 2024-11-14 ENCOUNTER — Other Ambulatory Visit (HOSPITAL_COMMUNITY): Payer: Self-pay

## 2024-11-16 ENCOUNTER — Ambulatory Visit: Payer: Self-pay | Admitting: Nurse Practitioner

## 2024-11-16 ENCOUNTER — Telehealth: Payer: Self-pay

## 2024-11-16 NOTE — Telephone Encounter (Signed)
 Noted added to lab tab

## 2024-11-16 NOTE — Telephone Encounter (Signed)
 Copied from CRM [id number]. Topic: Clinical - Lab/Test Results >> Nov 16, 2024  8:35 AM Deaijah H wrote: Reason for CRM: Relayed results, patient stated Hematoma still there and would like to have something done, drained because it's becoming a pain. Unsure who to talk to, to have it drained.

## 2024-11-21 ENCOUNTER — Other Ambulatory Visit: Payer: Self-pay

## 2024-11-28 ENCOUNTER — Telehealth: Payer: Self-pay

## 2024-11-28 NOTE — Telephone Encounter (Signed)
 Copied from CRM #[id number]. Topic: General - Other >> Nov 28, 2024  3:06 PM Alfonso HERO wrote: Reason for CRM: patient asking for the doctor or nurse can call her. I asked her for details and she said it's too much to explain and to just have someone call her back.

## 2024-11-28 NOTE — Telephone Encounter (Signed)
 Called pt to get info   Pt stated she saw her orthopedic MD on last week and he referred her to a Engineer, petroleum( documented in lab tab as well) pt was  advised to call her orthopedic to get the Plastic surgeon's phone number to call to schedule as she stated no one has given her a call yet.   Pt stated she got the number and it seems to be a Programmer, Systems in [place] and she wanted to know if Leron can make her an appointment.    I explained to the pt that if she has the phone number she can call up there herself to make an appt as she will be able to tell them her availability and to get their info. Pt verbalized understanding

## 2024-11-29 ENCOUNTER — Ambulatory Visit: Admitting: Plastic Surgery

## 2024-11-29 VITALS — BP 171/75 | HR 63 | Ht 61.0 in | Wt 133.0 lb

## 2024-11-29 DIAGNOSIS — S7012XA Contusion of left thigh, initial encounter: Secondary | ICD-10-CM | POA: Diagnosis not present

## 2024-11-29 DIAGNOSIS — S300XXA Contusion of lower back and pelvis, initial encounter: Secondary | ICD-10-CM

## 2024-11-29 DIAGNOSIS — S7002XA Contusion of left hip, initial encounter: Secondary | ICD-10-CM

## 2024-11-29 DIAGNOSIS — W19XXXA Unspecified fall, initial encounter: Secondary | ICD-10-CM

## 2024-11-29 DIAGNOSIS — I10 Essential (primary) hypertension: Secondary | ICD-10-CM

## 2024-11-29 NOTE — Progress Notes (Signed)
 "    Patient ID: Morgan Byrd, female    DOB: [birth year], 89 y.o.   MRN: [id number]   Chief Complaint  Patient presents with   Skin Problem    The patient is a 89 year old female here for evaluation of her left thigh.  Over a month ago she fell on her deck at home.  She sustained a large bruise and hematoma to her left thigh and gluteal area.  It has not gotten smaller and is getting firm and very uncomfortable.  The bruising has improved a little although she still does have some bruising.  There does not appear to be infection and no break in the skin.  She takes a baby aspirin and has a history of cancer, arthritis, anxiety,.  Her past surgical history is listed below.  She is not a smoker.  The area is approximately 10 x 15 cm.  It is very firm.  It is covering the majority of her gluteal area.    Review of Systems  Constitutional: Negative.   HENT: Negative.    Eyes: Negative.   Respiratory: Negative.    Cardiovascular: Negative.   Gastrointestinal: Negative.   Endocrine: Negative.   Genitourinary: Negative.   Musculoskeletal: Negative.   Skin:  Positive for wound.    Past Medical History:  Diagnosis Date   Anxiety    Arthritis    C2 cervical fracture (HCC) 11/14/2019   Cancer (HCC)    Complication of anesthesia    Heart murmur    History of closed fracture of nasal bones 10/11/2019   Hypertension    Lung nodule 04/05/2019   CT scan completed May 2020 recommends repeating in 12 months   Nasal fracture 11/14/2019   PONV (postoperative nausea and vomiting)     Past Surgical History:  Procedure Laterality Date   ABDOMINAL HYSTERECTOMY     BLADDER SUSPENSION     BREAST BIOPSY Right 1978   neg   cancer cell removed from hand  04/2023   CHOLECYSTECTOMY     COLONOSCOPY WITH PROPOFOL  N/A 12/26/2015   Procedure: COLONOSCOPY WITH PROPOFOL ;  Surgeon: Lamar ONEIDA Holmes, MD;  Location: [facility] ENDOSCOPY;  Service: Endoscopy;  Laterality: N/A;   ESOPHAGOGASTRODUODENOSCOPY N/A  09/27/2022   Procedure: ESOPHAGOGASTRODUODENOSCOPY (EGD);  Surgeon: Onita Elspeth Sharper, DO;  Location: [facility] ENDOSCOPY;  Service: Gastroenterology;  Laterality: N/A;   LUMBAR LAMINECTOMY/DECOMPRESSION MICRODISCECTOMY Left 07/20/2021   Procedure: OPEN L4/5 LEFT HEMILAMINECTOMY & DISCECTOMY;  Surgeon: Bluford Elspeth, MD;  Location: [facility] ORS;  Service: Neurosurgery;  Laterality: Left;   NOSE SURGERY     REPLACEMENT TOTAL KNEE BILATERAL     SHOULDER SURGERY     VAGINAL DELIVERY     2     Current Medications[1]   Objective:   Vitals:   11/29/24 1052  BP: (!) 171/75  Pulse: 63  SpO2: 100%    Physical Exam Vitals reviewed.  Constitutional:      Appearance: Normal appearance.  HENT:     Head: Atraumatic.  Musculoskeletal:        General: Swelling, tenderness and deformity present.  Skin:    Capillary Refill: Capillary refill takes less than 2 seconds.     Findings: Bruising and lesion present.  Neurological:     Mental Status: She is alert and oriented to person, place, and time.  Psychiatric:        Mood and Affect: Mood normal.        Behavior: Behavior normal.  Thought Content: Thought content normal.        Judgment: Judgment normal.     Assessment & Plan:  Hematoma of left hip, initial encounter  Primary hypertension  Recommend excision of the fat necrosis and hematoma.  I would like to also do some liposuction so I can even it out.  Hopefully we will have to put a drain in but this is a possibility.  Pictures were obtained of the patient and placed in the chart with the patient's or guardian's permission.   [name], DO    [1]  Current Outpatient Medications:    amoxicillin  (AMOXIL ) 500 MG capsule, Take 4 capsules (2,000 mg total) by mouth one hour before dental appointments., Disp: 8 capsule, Rfl: 2   B Complex-C (SUPER B COMPLEX PO), Take 1 tablet by mouth daily., Disp: , Rfl:    CALCIUM-MAGNESIUM-ZINC PO, Take 1 tablet by mouth daily.,  Disp: , Rfl:    diclofenac  (VOLTAREN ) 75 MG EC tablet, Take 1 tablet (75 mg total) by mouth 2 (two) times daily as needed for moderate pain., Disp: 60 tablet, Rfl: 1   erythromycin  ophthalmic ointment, Place 1 Application into the left eye at bedtime. (Patient taking differently: Place 1 Application into the left eye at bedtime as needed.), Disp: 3.5 g, Rfl: 0   gabapentin  (NEURONTIN ) 300 MG capsule, Take 1 capsule (300 mg total) by mouth 2 (two) times daily., Disp: 60 capsule, Rfl: 5   hydrochlorothiazide  (MICROZIDE ) 12.5 MG capsule, Take 1 capsule (12.5 mg total) by mouth daily. (Patient taking differently: Take 12.5 mg by mouth as needed.), Disp: 90 capsule, Rfl: 3   methimazole  (TAPAZOLE ) 5 MG tablet, Take 0.5 tablets (2.5 mg total) by mouth daily., Disp: 45 tablet, Rfl: 4   metoprolol  succinate (TOPROL -XL) 25 MG 24 hr tablet, Take 1 tablet (25 mg total) by mouth daily., Disp: 90 tablet, Rfl: 3   Multiple Vitamin (MULTIVITAMIN) capsule, Take 1 capsule by mouth daily., Disp: , Rfl:    omeprazole  (PRILOSEC) 20 MG capsule, Take 1 capsule (20 mg total) by mouth daily., Disp: 90 capsule, Rfl: 3   sertraline  (ZOLOFT ) 50 MG tablet, Take 1 tablet (50 mg total) by mouth daily., Disp: 90 tablet, Rfl: 1  "

## 2024-12-07 ENCOUNTER — Other Ambulatory Visit: Payer: Self-pay

## 2024-12-07 MED FILL — Metoprolol Succinate Tab ER 24HR 25 MG (Tartrate Equiv): ORAL | 90 days supply | Qty: 90 | Fill #0 | Status: AC

## 2024-12-07 MED FILL — Omeprazole Cap Delayed Release 20 MG: ORAL | 90 days supply | Qty: 90 | Fill #0 | Status: AC

## 2025-01-22 ENCOUNTER — Ambulatory Visit: Admitting: Nurse Practitioner

## 2025-05-20 ENCOUNTER — Ambulatory Visit: Payer: Self-pay
